# Patient Record
Sex: Male | Born: 1940
Health system: Southern US, Community
[De-identification: ages and names within clinical notes are randomized; demographics above are authoritative.]

## PROBLEM LIST (undated history)

## (undated) DIAGNOSIS — I499 Cardiac arrhythmia, unspecified: Secondary | ICD-10-CM

## (undated) DIAGNOSIS — N390 Urinary tract infection, site not specified: Secondary | ICD-10-CM

## (undated) DIAGNOSIS — T8859XA Other complications of anesthesia, initial encounter: Secondary | ICD-10-CM

## (undated) DIAGNOSIS — H9190 Unspecified hearing loss, unspecified ear: Secondary | ICD-10-CM

## (undated) DIAGNOSIS — I480 Paroxysmal atrial fibrillation: Secondary | ICD-10-CM

## (undated) DIAGNOSIS — T4145XA Adverse effect of unspecified anesthetic, initial encounter: Secondary | ICD-10-CM

## (undated) DIAGNOSIS — R350 Frequency of micturition: Secondary | ICD-10-CM

## (undated) DIAGNOSIS — M1990 Unspecified osteoarthritis, unspecified site: Secondary | ICD-10-CM

## (undated) DIAGNOSIS — Z8719 Personal history of other diseases of the digestive system: Secondary | ICD-10-CM

## (undated) DIAGNOSIS — I350 Nonrheumatic aortic (valve) stenosis: Secondary | ICD-10-CM

## (undated) DIAGNOSIS — I2510 Atherosclerotic heart disease of native coronary artery without angina pectoris: Secondary | ICD-10-CM

## (undated) DIAGNOSIS — C801 Malignant (primary) neoplasm, unspecified: Secondary | ICD-10-CM

## (undated) HISTORY — PX: MULTIPLE TOOTH EXTRACTIONS: SHX2053

## (undated) HISTORY — DX: Atherosclerotic heart disease of native coronary artery without angina pectoris: I25.10

## (undated) HISTORY — PX: ANKLE SURGERY: SHX546

## (undated) HISTORY — PX: ELBOW SURGERY: SHX618

## (undated) HISTORY — PX: KNEE SURGERY: SHX244

## (undated) HISTORY — PX: JOINT REPLACEMENT: SHX530

## (undated) HISTORY — DX: Nonrheumatic aortic (valve) stenosis: I35.0

## (undated) HISTORY — DX: Cardiac arrhythmia, unspecified: I49.9

## (undated) HISTORY — PX: EYE SURGERY: SHX253

## (undated) HISTORY — DX: Paroxysmal atrial fibrillation: I48.0

## (undated) HISTORY — PX: TOTAL KNEE ARTHROPLASTY: SHX125

## (undated) HISTORY — DX: Unspecified osteoarthritis, unspecified site: M19.90

## (undated) HISTORY — PX: HERNIA REPAIR: SHX51

## (undated) HISTORY — PX: COLONOSCOPY W/ BIOPSIES AND POLYPECTOMY: SHX1376

## (undated) SURGERY — Surgical Case
Anesthesia: *Unknown

---

## 2001-04-07 ENCOUNTER — Ambulatory Visit (HOSPITAL_COMMUNITY): Admission: RE | Admit: 2001-04-07 | Discharge: 2001-04-07 | Payer: Self-pay | Admitting: *Deleted

## 2001-04-07 ENCOUNTER — Encounter (INDEPENDENT_AMBULATORY_CARE_PROVIDER_SITE_OTHER): Payer: Self-pay | Admitting: *Deleted

## 2001-04-21 ENCOUNTER — Ambulatory Visit (HOSPITAL_COMMUNITY): Admission: RE | Admit: 2001-04-21 | Discharge: 2001-04-21 | Payer: Self-pay | Admitting: *Deleted

## 2001-04-21 ENCOUNTER — Encounter (INDEPENDENT_AMBULATORY_CARE_PROVIDER_SITE_OTHER): Payer: Self-pay | Admitting: Specialist

## 2002-06-08 ENCOUNTER — Encounter (INDEPENDENT_AMBULATORY_CARE_PROVIDER_SITE_OTHER): Payer: Self-pay | Admitting: *Deleted

## 2002-06-08 ENCOUNTER — Ambulatory Visit (HOSPITAL_COMMUNITY): Admission: RE | Admit: 2002-06-08 | Discharge: 2002-06-08 | Payer: Self-pay | Admitting: *Deleted

## 2003-04-17 ENCOUNTER — Ambulatory Visit (HOSPITAL_BASED_OUTPATIENT_CLINIC_OR_DEPARTMENT_OTHER): Admission: RE | Admit: 2003-04-17 | Discharge: 2003-04-17 | Payer: Self-pay | Admitting: Orthopedic Surgery

## 2004-07-03 ENCOUNTER — Encounter (INDEPENDENT_AMBULATORY_CARE_PROVIDER_SITE_OTHER): Payer: Self-pay | Admitting: Specialist

## 2004-07-03 ENCOUNTER — Ambulatory Visit (HOSPITAL_COMMUNITY): Admission: RE | Admit: 2004-07-03 | Discharge: 2004-07-03 | Payer: Self-pay | Admitting: *Deleted

## 2004-10-02 ENCOUNTER — Ambulatory Visit (HOSPITAL_BASED_OUTPATIENT_CLINIC_OR_DEPARTMENT_OTHER): Admission: RE | Admit: 2004-10-02 | Discharge: 2004-10-02 | Payer: Self-pay | Admitting: Orthopedic Surgery

## 2004-10-02 ENCOUNTER — Ambulatory Visit (HOSPITAL_COMMUNITY): Admission: RE | Admit: 2004-10-02 | Discharge: 2004-10-02 | Payer: Self-pay | Admitting: Orthopedic Surgery

## 2005-08-19 ENCOUNTER — Ambulatory Visit (HOSPITAL_COMMUNITY): Admission: RE | Admit: 2005-08-19 | Discharge: 2005-08-19 | Payer: Self-pay | Admitting: Ophthalmology

## 2010-04-08 ENCOUNTER — Inpatient Hospital Stay (HOSPITAL_COMMUNITY): Admission: RE | Admit: 2010-04-08 | Discharge: 2010-04-10 | Payer: Self-pay | Admitting: Orthopedic Surgery

## 2010-04-17 ENCOUNTER — Ambulatory Visit: Payer: Self-pay | Admitting: Internal Medicine

## 2010-04-17 ENCOUNTER — Inpatient Hospital Stay (HOSPITAL_COMMUNITY): Admission: EM | Admit: 2010-04-17 | Discharge: 2010-04-22 | Payer: Self-pay | Admitting: Orthopedic Surgery

## 2010-04-19 ENCOUNTER — Encounter: Payer: Self-pay | Admitting: Physical Medicine & Rehabilitation

## 2010-04-22 ENCOUNTER — Ambulatory Visit: Payer: Self-pay | Admitting: Infectious Diseases

## 2010-04-26 ENCOUNTER — Ambulatory Visit (HOSPITAL_COMMUNITY): Admission: RE | Admit: 2010-04-26 | Discharge: 2010-04-26 | Payer: Self-pay | Admitting: Orthopedic Surgery

## 2010-06-14 ENCOUNTER — Encounter
Admission: RE | Admit: 2010-06-14 | Discharge: 2010-06-14 | Payer: Self-pay | Source: Home / Self Care | Attending: Orthopedic Surgery | Admitting: Orthopedic Surgery

## 2010-10-31 LAB — CBC
HCT: 23.1 % — ABNORMAL LOW (ref 39.0–52.0)
HCT: 23.5 % — ABNORMAL LOW (ref 39.0–52.0)
HCT: 23.8 % — ABNORMAL LOW (ref 39.0–52.0)
HCT: 24.3 % — ABNORMAL LOW (ref 39.0–52.0)
Hemoglobin: 7.6 g/dL — ABNORMAL LOW (ref 13.0–17.0)
Hemoglobin: 7.7 g/dL — ABNORMAL LOW (ref 13.0–17.0)
Hemoglobin: 7.9 g/dL — ABNORMAL LOW (ref 13.0–17.0)
Hemoglobin: 8.1 g/dL — ABNORMAL LOW (ref 13.0–17.0)
MCH: 27.9 pg (ref 26.0–34.0)
MCH: 28.7 pg (ref 26.0–34.0)
MCH: 28.7 pg (ref 26.0–34.0)
MCH: 28.8 pg (ref 26.0–34.0)
MCHC: 32.3 g/dL (ref 30.0–36.0)
MCHC: 33.2 g/dL (ref 30.0–36.0)
MCHC: 33.3 g/dL (ref 30.0–36.0)
MCHC: 33.3 g/dL (ref 30.0–36.0)
MCV: 86.2 fL (ref 78.0–100.0)
MCV: 86.4 fL (ref 78.0–100.0)
MCV: 86.5 fL (ref 78.0–100.0)
MCV: 86.5 fL (ref 78.0–100.0)
Platelets: 430 10*3/uL — ABNORMAL HIGH (ref 150–400)
Platelets: 466 10*3/uL — ABNORMAL HIGH (ref 150–400)
Platelets: 483 10*3/uL — ABNORMAL HIGH (ref 150–400)
Platelets: 501 10*3/uL — ABNORMAL HIGH (ref 150–400)
RBC: 2.67 MIL/uL — ABNORMAL LOW (ref 4.22–5.81)
RBC: 2.72 MIL/uL — ABNORMAL LOW (ref 4.22–5.81)
RBC: 2.75 MIL/uL — ABNORMAL LOW (ref 4.22–5.81)
RBC: 2.82 MIL/uL — ABNORMAL LOW (ref 4.22–5.81)
RDW: 12.9 % (ref 11.5–15.5)
RDW: 13 % (ref 11.5–15.5)
RDW: 13.1 % (ref 11.5–15.5)
RDW: 13.3 % (ref 11.5–15.5)
WBC: 6.4 10*3/uL (ref 4.0–10.5)
WBC: 7.1 10*3/uL (ref 4.0–10.5)
WBC: 7.5 10*3/uL (ref 4.0–10.5)
WBC: 7.9 10*3/uL (ref 4.0–10.5)

## 2010-10-31 LAB — BASIC METABOLIC PANEL
BUN: 13 mg/dL (ref 6–23)
BUN: 14 mg/dL (ref 6–23)
BUN: 9 mg/dL (ref 6–23)
BUN: 9 mg/dL (ref 6–23)
CO2: 25 mEq/L (ref 19–32)
CO2: 26 mEq/L (ref 19–32)
CO2: 27 mEq/L (ref 19–32)
CO2: 28 mEq/L (ref 19–32)
Calcium: 8.2 mg/dL — ABNORMAL LOW (ref 8.4–10.5)
Calcium: 8.2 mg/dL — ABNORMAL LOW (ref 8.4–10.5)
Calcium: 8.3 mg/dL — ABNORMAL LOW (ref 8.4–10.5)
Calcium: 8.6 mg/dL (ref 8.4–10.5)
Chloride: 102 mEq/L (ref 96–112)
Chloride: 102 mEq/L (ref 96–112)
Chloride: 103 mEq/L (ref 96–112)
Chloride: 104 mEq/L (ref 96–112)
Creatinine, Ser: 0.72 mg/dL (ref 0.4–1.5)
Creatinine, Ser: 0.75 mg/dL (ref 0.4–1.5)
Creatinine, Ser: 0.81 mg/dL (ref 0.4–1.5)
Creatinine, Ser: 0.82 mg/dL (ref 0.4–1.5)
GFR calc Af Amer: >60 mL/min (ref >60)
GFR calc Af Amer: >60 mL/min (ref >60)
GFR calc Af Amer: >60 mL/min (ref >60)
GFR calc Af Amer: >60 mL/min (ref >60)
GFR calc non Af Amer: >60 mL/min (ref >60)
GFR calc non Af Amer: >60 mL/min (ref >60)
GFR calc non Af Amer: >60 mL/min (ref >60)
GFR calc non Af Amer: >60 mL/min (ref >60)
Glucose, Bld: 109 mg/dL — ABNORMAL HIGH (ref 70–99)
Glucose, Bld: 113 mg/dL — ABNORMAL HIGH (ref 70–99)
Glucose, Bld: 116 mg/dL — ABNORMAL HIGH (ref 70–99)
Glucose, Bld: 136 mg/dL — ABNORMAL HIGH (ref 70–99)
Potassium: 3.6 mEq/L (ref 3.5–5.1)
Potassium: 3.8 mEq/L (ref 3.5–5.1)
Potassium: 4.2 mEq/L (ref 3.5–5.1)
Potassium: 5.1 mEq/L (ref 3.5–5.1)
Sodium: 134 mEq/L — ABNORMAL LOW (ref 135–145)
Sodium: 134 mEq/L — ABNORMAL LOW (ref 135–145)
Sodium: 135 mEq/L (ref 135–145)
Sodium: 137 mEq/L (ref 135–145)

## 2010-10-31 LAB — GRAM STAIN

## 2010-10-31 LAB — VANCOMYCIN, TROUGH: Vancomycin Tr: 6.7 ug/mL — ABNORMAL LOW (ref 10.0–20.0)

## 2010-10-31 LAB — AFB CULTURE WITH SMEAR (NOT AT ARMC): Acid Fast Smear: NONE SEEN

## 2010-10-31 LAB — FUNGUS CULTURE W SMEAR: Fungal Smear: NONE SEEN

## 2010-10-31 LAB — CROSSMATCH
ABO/RH(D): O POS
Antibody Screen: NEGATIVE

## 2010-10-31 LAB — COMPREHENSIVE METABOLIC PANEL
ALT: 74 U/L — ABNORMAL HIGH (ref 0–53)
AST: 89 U/L — ABNORMAL HIGH (ref 0–37)
Albumin: 2.3 g/dL — ABNORMAL LOW (ref 3.5–5.2)
Alkaline Phosphatase: 108 U/L (ref 39–117)
BUN: 10 mg/dL (ref 6–23)
CO2: 27 mEq/L (ref 19–32)
Calcium: 8.1 mg/dL — ABNORMAL LOW (ref 8.4–10.5)
Chloride: 101 mEq/L (ref 96–112)
Creatinine, Ser: 0.78 mg/dL (ref 0.4–1.5)
GFR calc Af Amer: >60 mL/min (ref >60)
GFR calc non Af Amer: >60 mL/min (ref >60)
Glucose, Bld: 106 mg/dL — ABNORMAL HIGH (ref 70–99)
Potassium: 3.8 mEq/L (ref 3.5–5.1)
Sodium: 134 mEq/L — ABNORMAL LOW (ref 135–145)
Total Bilirubin: 1.3 mg/dL — ABNORMAL HIGH (ref 0.3–1.2)
Total Protein: 5.2 g/dL — ABNORMAL LOW (ref 6.0–8.3)

## 2010-10-31 LAB — BODY FLUID CULTURE: Culture: NO GROWTH

## 2010-10-31 LAB — MRSA PCR SCREENING: MRSA by PCR: NEGATIVE

## 2010-10-31 LAB — ANAEROBIC CULTURE

## 2010-10-31 LAB — GLUCOSE, CAPILLARY: Glucose-Capillary: 140 mg/dL — ABNORMAL HIGH (ref 70–99)

## 2010-10-31 LAB — C-REACTIVE PROTEIN: CRP: 6.3 mg/dL — ABNORMAL HIGH (ref <0.6)

## 2010-10-31 LAB — SEDIMENTATION RATE: Sed Rate: 50 mm/hr — ABNORMAL HIGH (ref 0–16)

## 2010-11-01 LAB — BASIC METABOLIC PANEL
BUN: 12 mg/dL (ref 6–23)
BUN: 15 mg/dL (ref 6–23)
CO2: 27 mEq/L (ref 19–32)
CO2: 29 mEq/L (ref 19–32)
Calcium: 8.3 mg/dL — ABNORMAL LOW (ref 8.4–10.5)
Calcium: 8.5 mg/dL (ref 8.4–10.5)
Chloride: 102 mEq/L (ref 96–112)
Chloride: 104 mEq/L (ref 96–112)
Creatinine, Ser: 0.87 mg/dL (ref 0.4–1.5)
Creatinine, Ser: 0.89 mg/dL (ref 0.4–1.5)
GFR calc Af Amer: >60 mL/min (ref >60)
GFR calc Af Amer: >60 mL/min (ref >60)
GFR calc non Af Amer: >60 mL/min (ref >60)
GFR calc non Af Amer: >60 mL/min (ref >60)
Glucose, Bld: 121 mg/dL — ABNORMAL HIGH (ref 70–99)
Glucose, Bld: 125 mg/dL — ABNORMAL HIGH (ref 70–99)
Potassium: 4.3 mEq/L (ref 3.5–5.1)
Potassium: 4.9 mEq/L (ref 3.5–5.1)
Sodium: 135 mEq/L (ref 135–145)
Sodium: 135 mEq/L (ref 135–145)

## 2010-11-01 LAB — BODY FLUID CULTURE: Culture: NO GROWTH

## 2010-11-01 LAB — URINALYSIS, ROUTINE W REFLEX MICROSCOPIC
Bilirubin Urine: NEGATIVE
Glucose, UA: NEGATIVE mg/dL
Glucose, UA: NEGATIVE mg/dL
Hgb urine dipstick: NEGATIVE
Hgb urine dipstick: NEGATIVE
Ketones, ur: 40 mg/dL — AB
Ketones, ur: NEGATIVE mg/dL
Leukocytes, UA: NEGATIVE
Nitrite: NEGATIVE
Nitrite: NEGATIVE
Protein, ur: 30 mg/dL — AB
Protein, ur: NEGATIVE mg/dL
Specific Gravity, Urine: 1.016 (ref 1.005–1.030)
Specific Gravity, Urine: 1.028 (ref 1.005–1.030)
Urobilinogen, UA: 1 mg/dL (ref 0.0–1.0)
Urobilinogen, UA: 1 mg/dL (ref 0.0–1.0)
pH: 6 (ref 5.0–8.0)
pH: 6.5 (ref 5.0–8.0)

## 2010-11-01 LAB — DIFFERENTIAL
Basophils Absolute: 0 10*3/uL (ref 0.0–0.1)
Basophils Absolute: 0 10*3/uL (ref 0.0–0.1)
Basophils Relative: 0 % (ref 0–1)
Basophils Relative: 1 % (ref 0–1)
Eosinophils Absolute: 0.1 10*3/uL (ref 0.0–0.7)
Eosinophils Absolute: 0.2 10*3/uL (ref 0.0–0.7)
Eosinophils Relative: 1 % (ref 0–5)
Eosinophils Relative: 3 % (ref 0–5)
Lymphocytes Relative: 25 % (ref 12–46)
Lymphocytes Relative: 9 % — ABNORMAL LOW (ref 12–46)
Lymphs Abs: 0.9 10*3/uL (ref 0.7–4.0)
Lymphs Abs: 1.4 10*3/uL (ref 0.7–4.0)
Monocytes Absolute: 0.5 10*3/uL (ref 0.1–1.0)
Monocytes Absolute: 1.2 10*3/uL — ABNORMAL HIGH (ref 0.1–1.0)
Monocytes Relative: 12 % (ref 3–12)
Monocytes Relative: 9 % (ref 3–12)
Neutro Abs: 3.4 10*3/uL (ref 1.7–7.7)
Neutro Abs: 7.7 10*3/uL (ref 1.7–7.7)
Neutrophils Relative %: 62 % (ref 43–77)
Neutrophils Relative %: 78 % — ABNORMAL HIGH (ref 43–77)

## 2010-11-01 LAB — CBC
HCT: 28.5 % — ABNORMAL LOW (ref 39.0–52.0)
HCT: 32.2 % — ABNORMAL LOW (ref 39.0–52.0)
HCT: 35.9 % — ABNORMAL LOW (ref 39.0–52.0)
HCT: 44.1 % (ref 39.0–52.0)
Hemoglobin: 11.1 g/dL — ABNORMAL LOW (ref 13.0–17.0)
Hemoglobin: 12.3 g/dL — ABNORMAL LOW (ref 13.0–17.0)
Hemoglobin: 15.2 g/dL (ref 13.0–17.0)
Hemoglobin: 9.7 g/dL — ABNORMAL LOW (ref 13.0–17.0)
MCH: 29 pg (ref 26.0–34.0)
MCH: 29.1 pg (ref 26.0–34.0)
MCH: 29.2 pg (ref 26.0–34.0)
MCH: 29.5 pg (ref 26.0–34.0)
MCHC: 34 g/dL (ref 30.0–36.0)
MCHC: 34.3 g/dL (ref 30.0–36.0)
MCHC: 34.5 g/dL (ref 30.0–36.0)
MCHC: 34.5 g/dL (ref 30.0–36.0)
MCV: 84.7 fL (ref 78.0–100.0)
MCV: 84.7 fL (ref 78.0–100.0)
MCV: 85.5 fL (ref 78.0–100.0)
MCV: 85.6 fL (ref 78.0–100.0)
Platelets: 100 10*3/uL — ABNORMAL LOW (ref 150–400)
Platelets: 107 10*3/uL — ABNORMAL LOW (ref 150–400)
Platelets: 156 10*3/uL (ref 150–400)
Platelets: 392 10*3/uL (ref 150–400)
RBC: 3.33 MIL/uL — ABNORMAL LOW (ref 4.22–5.81)
RBC: 3.8 MIL/uL — ABNORMAL LOW (ref 4.22–5.81)
RBC: 4.24 MIL/uL (ref 4.22–5.81)
RBC: 5.16 MIL/uL (ref 4.22–5.81)
RDW: 12.5 % (ref 11.5–15.5)
RDW: 12.5 % (ref 11.5–15.5)
RDW: 12.6 % (ref 11.5–15.5)
RDW: 12.9 % (ref 11.5–15.5)
WBC: 5.5 10*3/uL (ref 4.0–10.5)
WBC: 7.5 10*3/uL (ref 4.0–10.5)
WBC: 8.4 10*3/uL (ref 4.0–10.5)
WBC: 9.9 10*3/uL (ref 4.0–10.5)

## 2010-11-01 LAB — COMPREHENSIVE METABOLIC PANEL
ALT: 22 U/L (ref 0–53)
ALT: 36 U/L (ref 0–53)
AST: 27 U/L (ref 0–37)
AST: 40 U/L — ABNORMAL HIGH (ref 0–37)
Albumin: 3 g/dL — ABNORMAL LOW (ref 3.5–5.2)
Albumin: 4.1 g/dL (ref 3.5–5.2)
Alkaline Phosphatase: 67 U/L (ref 39–117)
Alkaline Phosphatase: 98 U/L (ref 39–117)
BUN: 18 mg/dL (ref 6–23)
BUN: 21 mg/dL (ref 6–23)
CO2: 25 mEq/L (ref 19–32)
CO2: 28 mEq/L (ref 19–32)
Calcium: 8.7 mg/dL (ref 8.4–10.5)
Calcium: 9.7 mg/dL (ref 8.4–10.5)
Chloride: 108 mEq/L (ref 96–112)
Chloride: 99 mEq/L (ref 96–112)
Creatinine, Ser: 0.96 mg/dL (ref 0.4–1.5)
Creatinine, Ser: 1 mg/dL (ref 0.4–1.5)
GFR calc Af Amer: >60 mL/min (ref >60)
GFR calc Af Amer: >60 mL/min (ref >60)
GFR calc non Af Amer: >60 mL/min (ref >60)
GFR calc non Af Amer: >60 mL/min (ref >60)
Glucose, Bld: 130 mg/dL — ABNORMAL HIGH (ref 70–99)
Glucose, Bld: 99 mg/dL (ref 70–99)
Potassium: 4.1 mEq/L (ref 3.5–5.1)
Potassium: 5.2 mEq/L — ABNORMAL HIGH (ref 3.5–5.1)
Sodium: 132 mEq/L — ABNORMAL LOW (ref 135–145)
Sodium: 141 mEq/L (ref 135–145)
Total Bilirubin: 0.9 mg/dL (ref 0.3–1.2)
Total Bilirubin: 1.6 mg/dL — ABNORMAL HIGH (ref 0.3–1.2)
Total Protein: 6.2 g/dL (ref 6.0–8.3)
Total Protein: 6.5 g/dL (ref 6.0–8.3)

## 2010-11-01 LAB — SYNOVIAL CELL COUNT + DIFF, W/ CRYSTALS
Crystals, Fluid: NONE SEEN
Lymphocytes-Synovial Fld: 1 % (ref 0–20)
Neutrophil, Synovial: 99 % — ABNORMAL HIGH (ref 0–25)
Other Cells-SYN: ABNORMAL
WBC, Synovial: UNDETERMINED /mm3 (ref 0–200)

## 2010-11-01 LAB — PROTIME-INR
INR: 1.01 (ref 0.00–1.49)
INR: 1.18 (ref 0.00–1.49)
INR: 1.79 — ABNORMAL HIGH (ref 0.00–1.49)
INR: 1.89 — ABNORMAL HIGH (ref 0.00–1.49)
Prothrombin Time: 13.5 seconds (ref 11.6–15.2)
Prothrombin Time: 15.2 seconds (ref 11.6–15.2)
Prothrombin Time: 21 seconds — ABNORMAL HIGH (ref 11.6–15.2)
Prothrombin Time: 21.9 seconds — ABNORMAL HIGH (ref 11.6–15.2)

## 2010-11-01 LAB — CULTURE, BLOOD (ROUTINE X 2)
Culture: NO GROWTH
Culture: NO GROWTH

## 2010-11-01 LAB — URINE CULTURE
Colony Count: NO GROWTH
Colony Count: NO GROWTH
Culture  Setup Time: 201108162043
Culture  Setup Time: 201109010050
Culture: NO GROWTH
Culture: NO GROWTH

## 2010-11-01 LAB — TYPE AND SCREEN
ABO/RH(D): O POS
Antibody Screen: NEGATIVE

## 2010-11-01 LAB — URINE MICROSCOPIC-ADD ON

## 2010-11-01 LAB — PATHOLOGIST SMEAR REVIEW

## 2010-11-01 LAB — SURGICAL PCR SCREEN
MRSA, PCR: NEGATIVE
Staphylococcus aureus: NEGATIVE

## 2010-11-01 LAB — APTT
aPTT: 26 seconds (ref 24–37)
aPTT: 41 seconds — ABNORMAL HIGH (ref 24–37)

## 2010-11-01 LAB — ABO/RH: ABO/RH(D): O POS

## 2011-01-03 NOTE — Op Note (Signed)
NAME:  Gerald Spears, Gerald Spears             ACCOUNT NO.:  [id number]   MEDICAL RECORD NO.:  [id number]          PATIENT TYPE:  AMB   LOCATION:  SDS                          FACILITY:  [facility]   PHYSICIAN:  Robert L. Dione Booze, M.D.  DATE OF BIRTH:  29-Nov-1940   DATE OF PROCEDURE:  08/19/2005  DATE OF DISCHARGE:                                 OPERATIVE REPORT   INDICATIONS AND JUSTIFICATIONS:  [name] was seen recently on July 02, 2005 reporting that he has droopy eyelids and this blocks his peripheral  vision and causes fatigue.  He can feel the weight of the lids.  Visual  field testing does show a large reduction of about 20-25 degrees in each  field  when the skin of the lid was taped compared to when it is not taped.  Photographs were taken to document the condition and the margin reflex  distance of about 1 mm on the right and 10 mm on the left.  Examination does  show that there is significant amount of __________  of each upper eyelid  that would be expected to obstruct the __________  in the temporal visual  field.  The examination further shows that the best corrected vision is 20-  20 in each eye.  The pressure is 19 in the right eye and 18 in the left.  The pupils, motility, conjunctivae, cornea, and two-chamber lens, and  dilated fundus exam were negative.  The problem was discussed and Mr.  Dambach decided to go ahead and have upper eyelid blepharoplasties for  visual reasons.  Medically, he should be stable for this.   Justification for performing the procedure in the outpatient setting is  routine.  Justification __________  none.   PREOPERATIVE DIAGNOSIS:  Severe dermatochalasis with visual impairment.   POSTOPERATIVE DIAGNOSIS:  Severe dermatochalasis with visual impairment.   OPERATION/PROCEDURE:  Upper eyelid blepharoplasties.   SURGEON:  Robert L. Dione Booze, M.D.   ANESTHESIA:  1% Xylocaine with epinephrine.   OPERATION/PROCEDURE:  The patient arrived in the minor  surgery room and was  prepped and draped in the routine fashion.  One percent Xylocaine with  epinephrine was given in the skin of each upper eyelid and skin to be  excised was carefully demarcated and excised.  Bleeding was controlled with  pressure and cautery.  Underlying fatty tissue was also excised.  After the  appropriate amount of skin and fatty tissue had been removed, each wound was  closed with a running 6-0 nylon suture and cold compresses and were applied.  Polysporin ointment was also used.  The patient then left the minor surgery  room, having done nicely.   FOLLOW UP:  The patient is to keep his head elevated today and use cold  compresses.  Starting tomorrow he is to use warm compresses and is to have  sutures removed in six days.           ______________________________  Doris Cheadle Dione Booze, M.D.    RLG/MEDQ  D:  08/20/2005  T:  08/21/2005  Job:  [id number]

## 2011-01-03 NOTE — Op Note (Signed)
NAME:  Gerald Spears, Gerald Spears                     ACCOUNT NO.:  [id number]   MEDICAL RECORD NO.:  [id number]                   PATIENT TYPE:  AMB   LOCATION:  DSC                                  FACILITY:  [facility]   PHYSICIAN:  Robert A. Thurston Hole, M.D.              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   POSTOPERATIVE DIAGNOSIS:  Left knee medial meniscal tear with  chondromalacia.   PROCEDURE:  1. Left knee examination under anesthesia followed by arthroscopic partial     medial meniscectomy.  2. Left knee chondroplasty.   SURGEON:  Elana Alm. Thurston Hole, M.D.   ASSISTANT:  Julien Girt, P.A.   ANESTHESIA:  General.   OPERATIVE TIME:  Thirty minutes.   COMPLICATIONS:  None.   INDICATION FOR PROCEDURE:  [name] is a 70 year old gentleman who has  had significant left knee pain for the past four to six weeks, increasing in  nature, with signs and symptoms and MRI documenting a medial meniscal tear,  who has failed conservative care and is now to undergo arthroscopy.   DESCRIPTION:  [name] was brought to the operating room on April 17, 2003 and placed on the operative table in supine position.  After an  adequate level of general anesthesia was obtained, his left knee was  examined under anesthesia.  Range of motion was 0 to 120 degrees with 1 to  2+ crepitation and knee stable to ligamentous exam with normal patellar  tracking.  The knee was sterilely injected with 0.25% Marcaine with  epinephrine.  The left leg was then prepped using sterile Duraprep and  draped using sterile technique.  Originally, through an inferolateral  portal, the arthroscope with a pump attachment was placed and through an  inferomedial portal, an arthroscopic probe was placed.  On initial  inspection in the medial  compartment, he was found to have 50% grade 3  chondromalacia, which was thoroughly debrided.  He had tearing of the  posterior and medial horn of the medial meniscus of which 50% was resected  back to a stable rim.  The intercondylar notch inspected; anterior and  posterior cruciate ligaments were normal.  Lateral compartment inspected;  the articular cartilage was normal and lateral meniscus was normal.  Patellofemoral joint inspected; articular cartilage was normal.  The patella  tracked normally.  Moderate synovitis in the medial and lateral gutters was  debrided, otherwise, they were free of pathology.  After this was done, it  was felt that all pathology had been satisfactorily addressed.  The  instruments were removed.  Portals were closed with 3-0 nylon suture and  injected with 0.25% Marcaine with epinephrine and 4 mg of morphine.  A  sterile dressing was applied and the patient awakened and taken to the  recovery room in stable condition.    FOLLOWUP CARE:  [name] will be followed as an outpatient on Vicodin and  Naprosyn.  I will see him back in the office in a week for sutures out and  followup.                                               Robert A. Thurston Hole, M.D.    RAW/MEDQ  D:  04/17/2003  T:  04/17/2003  Job:  [id number]

## 2011-01-03 NOTE — Op Note (Signed)
NAME:  Gerald Spears, Gerald Spears NO.:  [id number]   MEDICAL RECORD NO.:  [id number]          PATIENT TYPE:  AMB   LOCATION:  DSC                          FACILITY:  [facility]   PHYSICIAN:  Cindee Salt, M.D.       DATE OF BIRTH:  08-28-1940   DATE OF PROCEDURE:  10/02/2004  DATE OF DISCHARGE:                                 OPERATIVE REPORT   PREOPERATIVE DIAGNOSIS:  Ulnar neuropathy, right elbow.   POSTOPERATIVE DIAGNOSIS:  Ulnar neuropathy, right elbow.   OPERATION:  Decompression, right ulnar nerve, right elbow.   SURGEON:  Cindee Salt, M.D.   ASSISTANTCarolyne Fiscal.   ANESTHESIA:  General.   HISTORY:  The patient is a 70 year old male with a history of ulnar  neuropathy of his right elbow, EMG nerve conductions positive.  This has not  responded to conservative treatment.   PROCEDURE:  The patient was brought to the operating room, where a general  anesthetic was carried out without difficulty.  He was prepped using  DuraPrep, supine position, right arm free.  The limb was exsanguinated with  an Esmarch bandage, tourniquet placed on the arm was inflated to 250 mmHg.  A curvilinear incision was made over the medial epicondyle, carried down  through subcutaneous tissue.  Bleeders were electrocauterized and dissection carried down to the medial  epicondyle, the posterior branch of the medial antebrachial cutaneous nerve  of the arm identified.  The Osborne's fascia was then removed.  A tight band  was present in the flexor carpi ulnaris.  This was released, a fasciotomy  performed of the flexor carpi ulnaris.  The nerve was then traced proximally  to the ligament of Struthers.  The entire area was decompressed.  The medial  intermuscular septum was not removed.  The arm was placed through full flexion.  No subluxation to the nerve was  present.  It was decided not to proceed with transposition but maintain  simple decompression.  The wound was copiously irrigated with saline.   The  subcutaneous tissue was then closed with interrupted 4-0 Vicryl sutures and  the skin with a subcuticular of 4-0 Monocryl suture.  Steri-Strips were  applied, a sterile compressive dressing and a long-arm splint applied.  The  patient tolerated procedure well was taken to the recovery room for  observation in satisfactory condition.  He is discharged home to return to  the [facility] in one week on Vicodin.      GK/MEDQ  D:  10/02/2004  T:  10/02/2004  Job:  [id number]

## 2011-02-03 ENCOUNTER — Inpatient Hospital Stay (HOSPITAL_COMMUNITY)
Admission: RE | Admit: 2011-02-03 | Discharge: 2011-02-09 | DRG: 464 | Disposition: A | Discharge status: Home Health | Payer: Medicare Other | Source: Ambulatory Visit | Attending: Orthopedic Surgery | Admitting: Orthopedic Surgery

## 2011-02-03 DIAGNOSIS — M009 Pyogenic arthritis, unspecified: Secondary | ICD-10-CM

## 2011-02-03 DIAGNOSIS — Z96659 Presence of unspecified artificial knee joint: Secondary | ICD-10-CM

## 2011-02-03 DIAGNOSIS — Y831 Surgical operation with implant of artificial internal device as the cause of abnormal reaction of the patient, or of later complication, without mention of misadventure at the time of the procedure: Secondary | ICD-10-CM | POA: Diagnosis present

## 2011-02-03 DIAGNOSIS — T8450XA Infection and inflammatory reaction due to unspecified internal joint prosthesis, initial encounter: Principal | ICD-10-CM | POA: Diagnosis present

## 2011-02-03 LAB — URINALYSIS, ROUTINE W REFLEX MICROSCOPIC
Bilirubin Urine: NEGATIVE
Glucose, UA: NEGATIVE mg/dL
Hgb urine dipstick: NEGATIVE
Ketones, ur: NEGATIVE mg/dL
Leukocytes, UA: NEGATIVE
Nitrite: NEGATIVE
Protein, ur: NEGATIVE mg/dL
Specific Gravity, Urine: 1.02 (ref 1.005–1.030)
Urobilinogen, UA: 0.2 mg/dL (ref 0.0–1.0)
pH: 5.5 (ref 5.0–8.0)

## 2011-02-03 LAB — DIFFERENTIAL
Basophils Absolute: 0 10*3/uL (ref 0.0–0.1)
Basophils Relative: 0 % (ref 0–1)
Eosinophils Absolute: 0.1 10*3/uL (ref 0.0–0.7)
Eosinophils Relative: 1 % (ref 0–5)
Lymphocytes Relative: 12 % (ref 12–46)
Lymphs Abs: 1.3 10*3/uL (ref 0.7–4.0)
Monocytes Absolute: 0.8 10*3/uL (ref 0.1–1.0)
Monocytes Relative: 7 % (ref 3–12)
Neutro Abs: 8.9 10*3/uL — ABNORMAL HIGH (ref 1.7–7.7)
Neutrophils Relative %: 81 % — ABNORMAL HIGH (ref 43–77)

## 2011-02-03 LAB — CBC
HCT: 37.7 % — ABNORMAL LOW (ref 39.0–52.0)
Hemoglobin: 12.7 g/dL — ABNORMAL LOW (ref 13.0–17.0)
MCH: 27.1 pg (ref 26.0–34.0)
MCHC: 33.7 g/dL (ref 30.0–36.0)
MCV: 80.6 fL (ref 78.0–100.0)
Platelets: 247 10*3/uL (ref 150–400)
RBC: 4.68 MIL/uL (ref 4.22–5.81)
RDW: 12.8 % (ref 11.5–15.5)
WBC: 11.1 10*3/uL — ABNORMAL HIGH (ref 4.0–10.5)

## 2011-02-03 LAB — APTT: aPTT: 33 seconds (ref 24–37)

## 2011-02-03 LAB — SURGICAL PCR SCREEN
MRSA, PCR: NEGATIVE
Staphylococcus aureus: NEGATIVE

## 2011-02-03 LAB — PROTIME-INR
INR: 1.08 (ref 0.00–1.49)
Prothrombin Time: 14.2 seconds (ref 11.6–15.2)

## 2011-02-03 LAB — TYPE AND SCREEN
ABO/RH(D): O POS
Antibody Screen: NEGATIVE

## 2011-02-03 LAB — BASIC METABOLIC PANEL
BUN: 19 mg/dL (ref 6–23)
CO2: 27 mEq/L (ref 19–32)
Calcium: 9.2 mg/dL (ref 8.4–10.5)
Chloride: 100 mEq/L (ref 96–112)
Creatinine, Ser: 0.78 mg/dL (ref 0.50–1.35)
GFR calc Af Amer: >60 mL/min (ref >60)
GFR calc non Af Amer: >60 mL/min (ref >60)
Glucose, Bld: 92 mg/dL (ref 70–99)
Potassium: 4.2 mEq/L (ref 3.5–5.1)
Sodium: 136 mEq/L (ref 135–145)

## 2011-02-04 LAB — BASIC METABOLIC PANEL
BUN: 16 mg/dL (ref 6–23)
CO2: 29 mEq/L (ref 19–32)
Calcium: 8.4 mg/dL (ref 8.4–10.5)
Chloride: 100 mEq/L (ref 96–112)
Creatinine, Ser: 0.73 mg/dL (ref 0.50–1.35)
GFR calc Af Amer: >60 mL/min (ref >60)
GFR calc non Af Amer: >60 mL/min (ref >60)
Glucose, Bld: 141 mg/dL — ABNORMAL HIGH (ref 70–99)
Potassium: 4.1 mEq/L (ref 3.5–5.1)
Sodium: 133 mEq/L — ABNORMAL LOW (ref 135–145)

## 2011-02-04 LAB — CBC
HCT: 32.4 % — ABNORMAL LOW (ref 39.0–52.0)
Hemoglobin: 10.8 g/dL — ABNORMAL LOW (ref 13.0–17.0)
MCH: 26.9 pg (ref 26.0–34.0)
MCHC: 33.3 g/dL (ref 30.0–36.0)
MCV: 80.6 fL (ref 78.0–100.0)
Platelets: 210 10*3/uL (ref 150–400)
RBC: 4.02 MIL/uL — ABNORMAL LOW (ref 4.22–5.81)
RDW: 12.8 % (ref 11.5–15.5)
WBC: 10 10*3/uL (ref 4.0–10.5)

## 2011-02-04 LAB — PROTIME-INR
INR: 1.15 (ref 0.00–1.49)
Prothrombin Time: 14.9 seconds (ref 11.6–15.2)

## 2011-02-04 LAB — C-REACTIVE PROTEIN: CRP: 6.4 mg/dL — ABNORMAL HIGH (ref <0.6)

## 2011-02-04 LAB — SEDIMENTATION RATE: Sed Rate: 35 mm/hr — ABNORMAL HIGH (ref 0–16)

## 2011-02-05 LAB — CBC
HCT: 31.1 % — ABNORMAL LOW (ref 39.0–52.0)
Hemoglobin: 10.3 g/dL — ABNORMAL LOW (ref 13.0–17.0)
MCH: 27 pg (ref 26.0–34.0)
MCHC: 33.1 g/dL (ref 30.0–36.0)
MCV: 81.6 fL (ref 78.0–100.0)
Platelets: 181 10*3/uL (ref 150–400)
RBC: 3.81 MIL/uL — ABNORMAL LOW (ref 4.22–5.81)
RDW: 13 % (ref 11.5–15.5)
WBC: 7.2 10*3/uL (ref 4.0–10.5)

## 2011-02-05 LAB — CK: Total CK: 62 U/L (ref 7–232)

## 2011-02-05 LAB — PROTIME-INR
INR: 1.4 (ref 0.00–1.49)
Prothrombin Time: 17.4 seconds — ABNORMAL HIGH (ref 11.6–15.2)

## 2011-02-05 NOTE — Op Note (Signed)
NAMEMarland Kitchen  Gerald Spears, Gerald Spears NO.:  [id number]  MEDICAL RECORD NO.:  [id number]  LOCATION:  5018                         FACILITY:  [facility]  PHYSICIAN:  Feliberto Gottron. Turner Daniels, M.D.   DATE OF BIRTH:  1941-03-11  DATE OF PROCEDURE:  02/03/2011 DATE OF DISCHARGE:                              OPERATIVE REPORT   PREOPERATIVE DIAGNOSIS:  Infected right total knee.  POSTOPERATIVE DIAGNOSIS:  Infected right total knee.  PROCEDURE:  Radical irrigation and debridement of right total knee, removal of implants, placement of antibiotic-impregnated spacer.  SURGEON:  Feliberto Gottron. Turner Daniels, MD  FIRST ASSISTANT:  Sandria Bales, MD  SECOND ASSISTANT:  Shirl Harris, PA-C.  ANESTHESIA:  General endotracheal.  ESTIMATED BLOOD LOSS:  Minimal.  FLUID REPLACEMENT:  1500 mL of crystalloid.  DRAINS PLACED:  Two medium Hemovac and Foley catheter.  URINE OUTPUT:  300 mL.  INDICATIONS FOR PROCEDURE:  A 69 year old man who had a right total knee placed back in August 2012, had an early postoperative infection, underwent an early and urgent synovectomy and slopping out of polyethylene.  With IV antibiotics for about 4 weeks, cultures came back positive for methicillin-resistant staph epidermidis, but was on a reculture, and Infectious Diseases was absolutely sure this was the attending organisms.  In any event, he did well until last week when he had swelling and pain in his knee, saw Dr. Wyline Mood, had aspiration with 3 mL of blood, it came back with 300 decel in the organisms.  I saw him in consultation 2 days later at which time he had increased swelling in knee and aspiration yielded 30 mL of purulent material.  This material came back for almost pure PMNs, but unfortunately after 3 days did not grow any organisms.  Having said that, the fluid had the appearance of a gross pus.  He was placed on doxycycline last Thursday when the aspiration occurred over the weekend.  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  Risks and benefits of surgery have been discussed.  DESCRIPTION OF PROCEDURE:  The patient identified by armband, and taken to operating room 5 with appropriate anesthetic monitors attached and general endotracheal anesthesia was induced.  Tourniquet was applied high to the right thigh, and the right lower extremity was prepped and draped in usual sterile fashion first placing a foot rest and a lateral post on the table.  During the prep and drape, the knee was kept elevated at 45 degrees angle as the leg was.  The drapes were actually placed with the leg elevated.  After the leg was prepped and draped and a time-out procedure performed, the tourniquet was inflated to 350 mmHg. We did not use an Esmarch.  Utilizing the old total knee incision, which was about 18 cm in length, we cut through the skin and subcutaneous tissue down to the interval over the quadriceps tendon and patella and near the tibial tubercle.  We then elevated and reflected a medial flap right off of the fascia over the quadriceps tendon, patella, and proximal tibia.  We then made a parapatellar arthrotomy and encountered more purulent material.  We then performed a fairly radical synovectomy removing large amounts of inflamed synovium, and after that elevated the superficial medial collateral ligament, and subluxed the patella laterally allowing [place] to hyperflex the knee and expose the implants. There was obvious purulent material beneath the edges of the femoral and tibial components.  With the patella subluxed, we were able to use an osteotome and a removed the tibial bearing by cutting through the stem and then set about removing the femur using thin osteotomes around the edges until we  were able to go underneath the anterior flange, the posterior flange, and the distal flange, and chamfer cut and then used femoral extractor and take the femur off.  The bone was actually in pretty good shape.  Bits of cement were interdigitated into the bone were also removed at this time.  It was noted there was infectious membrane underneath the femoral implant, and this was removed as well. We then examined the tibial implant with a knee hyperflexed and using a 1-inch wide osteotome, packs removed relatively easily because of infectious membrane between the cement of the tibia and the bone also. In a similar fashion, the patella was removed.  We then spent the next 40-45 minutes removing cement.  It was interdigitated into the bone as well as the column of cement that was below the tip of the tibial implant.  The wound was then thoroughly pulse lavaged x2 completing the irrigation and debridement.  Satisfied with this, we then mixed a quadruple batch of the DePuy HV cement with 3 g tobramycin and 2.4 g of vancomycin and after 5 minutes when it was in a rather thick doughy state, matted into a spacer.  It was placed between the femur and tibia with the knee bent about 5 degrees and mild traction placed on the limb. We also fashioned a patellar implant for the back side of the patella. As the cement hardened, we used pulse lavage to make sure the tissues remained full and medium Hemovacs were placed on the wound.  After this had been completed, we closed over the drains using a running #1 Vicryl suture and the arthrotomy, 2-0 Vicryl in the subcutaneous tissue and skin staples.  We then applied a dressing of Xeroform, 4 x 4 dressing, sponges, Webril, Ace wrap, and knee immobilizer and at this point, the patient was then awakened, extubated, and taken to the recovery room after we had let the tourniquet down.     Feliberto Gottron. Turner Daniels, M.D.     Ovid Curd  D:  02/03/2011  T:  02/04/2011   Job:  [id number]  Electronically Signed by Gean Birchwood M.D. on 02/05/2011 06:01:35 PM

## 2011-02-06 LAB — PROTIME-INR
INR: 1.41 (ref 0.00–1.49)
Prothrombin Time: 17.5 seconds — ABNORMAL HIGH (ref 11.6–15.2)

## 2011-02-06 LAB — WOUND CULTURE
Culture: NO GROWTH
Gram Stain: NONE SEEN

## 2011-02-06 LAB — CBC
HCT: 28.1 % — ABNORMAL LOW (ref 39.0–52.0)
Hemoglobin: 9.4 g/dL — ABNORMAL LOW (ref 13.0–17.0)
MCH: 27 pg (ref 26.0–34.0)
MCHC: 33.5 g/dL (ref 30.0–36.0)
MCV: 80.7 fL (ref 78.0–100.0)
Platelets: 192 10*3/uL (ref 150–400)
RBC: 3.48 MIL/uL — ABNORMAL LOW (ref 4.22–5.81)
RDW: 13.2 % (ref 11.5–15.5)
WBC: 5.4 10*3/uL (ref 4.0–10.5)

## 2011-02-07 LAB — TISSUE CULTURE: Culture: NO GROWTH

## 2011-02-07 LAB — PROTIME-INR
INR: 1.51 — ABNORMAL HIGH (ref 0.00–1.49)
Prothrombin Time: 18.5 seconds — ABNORMAL HIGH (ref 11.6–15.2)

## 2011-02-08 LAB — ANAEROBIC CULTURE: Gram Stain: NONE SEEN

## 2011-02-08 LAB — PROTIME-INR
INR: 1.73 — ABNORMAL HIGH (ref 0.00–1.49)
Prothrombin Time: 20.6 seconds — ABNORMAL HIGH (ref 11.6–15.2)

## 2011-02-09 LAB — PROTIME-INR
INR: 1.54 — ABNORMAL HIGH (ref 0.00–1.49)
Prothrombin Time: 18.8 seconds — ABNORMAL HIGH (ref 11.6–15.2)

## 2011-02-10 ENCOUNTER — Encounter (HOSPITAL_COMMUNITY): Payer: Medicare Other | Attending: Infectious Diseases

## 2011-02-10 DIAGNOSIS — M009 Pyogenic arthritis, unspecified: Secondary | ICD-10-CM | POA: Insufficient documentation

## 2011-02-10 DIAGNOSIS — Z452 Encounter for adjustment and management of vascular access device: Secondary | ICD-10-CM | POA: Insufficient documentation

## 2011-02-11 ENCOUNTER — Encounter (HOSPITAL_COMMUNITY): Payer: Medicare Other

## 2011-02-11 ENCOUNTER — Telehealth: Payer: Self-pay | Admitting: *Deleted

## 2011-02-11 NOTE — Telephone Encounter (Signed)
Patient's  wife called requesting either an order for Home [facility]) to administer IV antibiotics on the weekend.  Patient is going to short stay during the week, due to co pays.  He does not want to have to go through the ED on the weekend.    Patient's wife Burna Mortimer is also requesting that Dr. Ninetta Lights call her at [phone] Wendall Mola CMA

## 2011-02-12 ENCOUNTER — Telehealth: Payer: Self-pay | Admitting: *Deleted

## 2011-02-12 ENCOUNTER — Other Ambulatory Visit: Payer: Self-pay | Admitting: Infectious Diseases

## 2011-02-12 ENCOUNTER — Encounter (HOSPITAL_COMMUNITY): Payer: Medicare Other

## 2011-02-12 LAB — CK TOTAL AND CKMB (NOT AT ARMC)
CK, MB: 2.2 ng/mL (ref 0.3–4.0)
Relative Index: 1.7 (ref 0.0–2.5)
Total CK: 128 U/L (ref 7–232)

## 2011-02-12 NOTE — Telephone Encounter (Signed)
Orders faxed to [facility] for Cubicin IV antibiotics x 35 days.  Spoke to [employer]  Orders also faxed to [facility]. Wendall Mola CMA

## 2011-02-13 ENCOUNTER — Encounter (HOSPITAL_COMMUNITY): Payer: Medicare Other

## 2011-02-13 NOTE — Telephone Encounter (Signed)
Orders were sent to [facility], patient decided to have IV antibiotics at home every day instead of going to Short Stay. Wendall Mola CMA

## 2011-02-14 ENCOUNTER — Encounter (HOSPITAL_COMMUNITY): Payer: BLUE CROSS/BLUE SHIELD

## 2011-02-17 ENCOUNTER — Encounter (HOSPITAL_COMMUNITY): Payer: BLUE CROSS/BLUE SHIELD

## 2011-02-18 ENCOUNTER — Encounter (HOSPITAL_COMMUNITY): Payer: BLUE CROSS/BLUE SHIELD

## 2011-02-20 ENCOUNTER — Encounter (HOSPITAL_COMMUNITY): Payer: BLUE CROSS/BLUE SHIELD

## 2011-02-21 ENCOUNTER — Encounter (HOSPITAL_COMMUNITY): Payer: BLUE CROSS/BLUE SHIELD

## 2011-02-24 ENCOUNTER — Encounter (HOSPITAL_COMMUNITY): Payer: BLUE CROSS/BLUE SHIELD

## 2011-02-25 ENCOUNTER — Encounter (HOSPITAL_COMMUNITY): Payer: BLUE CROSS/BLUE SHIELD

## 2011-02-26 ENCOUNTER — Encounter (HOSPITAL_COMMUNITY): Payer: BLUE CROSS/BLUE SHIELD

## 2011-02-27 ENCOUNTER — Encounter (HOSPITAL_COMMUNITY): Payer: BLUE CROSS/BLUE SHIELD

## 2011-02-28 ENCOUNTER — Encounter (HOSPITAL_COMMUNITY): Payer: BLUE CROSS/BLUE SHIELD

## 2011-03-01 NOTE — Discharge Summary (Signed)
NAMEMarland Kitchen  ARJAN, STROHM NO.:  [id number]  MEDICAL RECORD NO.:  [id number]  LOCATION:  5018                         FACILITY:  [facility]  PHYSICIAN:  Feliberto Gottron. Turner Daniels, M.D.   DATE OF BIRTH:  09-25-1940  DATE OF ADMISSION:  02/03/2011 DATE OF DISCHARGE:  02/09/2011                              DISCHARGE SUMMARY   CHIEF COMPLAINT:  Painful right total knee.  HISTORY OF PRESENT ILLNESS:  This is a 70 year old gentleman with persistent pain and swelling in his right total knee.  His primary knee surgery was done 1 year ago by Dr. Wyline Mood.  He has already undergone incision and drainage with poly exchange for methicillin-resistant staph epidermis.  He has recently developed recurrent swelling and was sent to Dr. Turner Daniels for second opinion.  Dr. Turner Daniels aspirated Mr. Betancur in the clinic and got purulent material which was sent off for Gram stain and culture.  Mr. Testa was then scheduled for revision of his right total knee.  PAST MEDICAL HISTORY:  Unremarkable.  PAST SURGICAL HISTORY:  Significant for right total knee and right knee poly exchange.  ALLERGIES:  He has no known drug allergies.  SOCIAL HISTORY:  He denies use of alcohol or tobacco.  FAMILY HISTORY:  Positive for heart disease.  OBJECTIVE:  Gross examination of the right knee demonstrates a 1+ effusion.  Range of motion is 5-125 degrees.  He has a well-healed surgical incision.  X-rays demonstrate well-placed, well-fixed right total knee components.  PREOP LABS:  White blood cells 11.1, red blood cells 4.68, hemoglobin 12.7, hematocrit 37.7, platelets 247,000.  PT 14.2, INR 1.08, PTT 33. Sodium 136, potassium 4.2, chloride 100, glucose 92, BUN 19, creatinine 0.78.  Urinalysis was within normal limits.  HOSPITAL COURSE:  Mr. Saccente was admitted to [facility] on February 03, 2011 when he underwent revision of his right total knee.  The procedure was performed by Dr. Gean Birchwood and the patient tolerated  it well.  All of his knee components were removed and replaced with a cement spacer. Two large Hemovac drains were placed into the knee.  Perioperative Foley catheter was placed.  He was transferred to the floor on Lovenox and Coumadin for DVT prophylaxis.  Infectious Disease was consulted for managing his antibiotics.  On the first postoperative day, he was awake and alert and tolerating p.o. intake well.  His drain output was 200 mL. Hemoglobin was 10.8.  On the second postoperative day, he was reporting good pain control.  Hemoglobin was 10.3.  Drain output was 75 mL.  He was evaluated by Physical Therapy.  He is partial weightbearing.  On the third postoperative day, his hemoglobin was 9.4.  His drain output had increased to 200 mL after he was particularly active with physical therapy, so the drain remained in another day.  On the 4th postoperative day, he was ambulating well with therapy.  His cultures were positive for coag-negative staph epidermis.  Knee drain output was 150 mL, so the drain was left in for an additional day.  On the 5th postoperative day, he was doing well.  He had decreased output in his drain, so it was pulled and he was kept  for evaluation to determine whether or not his effusion reaccumulated.  On postoperative day 6, he had a very mild effusion.  So he was discharged home on IV antibiotics per Infectious Disease.  DISPOSITION:  The patient was discharged home on February 09, 2011.  He was partial weightbearing.  Home health care would manage his Coumadin and IV antibiotics.  He would return to the clinic in a week for followup.  FINAL DIAGNOSIS:  Right total knee infection.     Shirl Harris, PA   ______________________________ Feliberto Gottron. Turner Daniels, M.D.    JW/MEDQ  D:  02/24/2011  T:  02/24/2011  Job:  [id number]  Electronically Signed by Shirl Harris PA on 02/26/2011 08:23:06 AM Electronically Signed by Gean Birchwood M.D. on 03/01/2011 11:53:30 PM

## 2011-03-11 ENCOUNTER — Telehealth: Payer: Self-pay | Admitting: *Deleted

## 2011-03-11 NOTE — Telephone Encounter (Signed)
Left message for Gerald Spears at the hospital to call me about making him an appt asap

## 2011-03-11 NOTE — Telephone Encounter (Signed)
I spoke with Gerald Spears at [name]. She will get him in to see md tomorrow & will call pt to let him know . I called the wife & she has been called abou the appt

## 2011-03-11 NOTE — Telephone Encounter (Signed)
Wife wants an appt. States she has tried twice & her spouse is not a pt here. Said dr told her she could have an appt. She is concerned about the PICC. It has a small red bump at site. The RN who comes to the home agreed it should be looked at. She wants it seen this week. He is almost done with the IV antibiotic & does not want to go to ED or other place where he would be exposed to more germs.  To Tomasita Morrow RN for ok to make an appt

## 2011-03-11 NOTE — Telephone Encounter (Signed)
Scott, rn with [facility] called [phone]) to report that the PICC line has a bloody purulent drainage & a 2cm boil that is not draining. I told him pt has an appt tomorrow at 10:30am

## 2011-03-12 ENCOUNTER — Encounter: Payer: Self-pay | Admitting: Infectious Diseases

## 2011-03-12 ENCOUNTER — Ambulatory Visit (INDEPENDENT_AMBULATORY_CARE_PROVIDER_SITE_OTHER): Payer: Medicare Other | Admitting: Infectious Diseases

## 2011-03-12 VITALS — BP 99/64 | HR 96 | Temp 97.4°F | Ht 71.0 in | Wt 180.0 lb

## 2011-03-12 DIAGNOSIS — M869 Osteomyelitis, unspecified: Secondary | ICD-10-CM

## 2011-03-12 MED ORDER — DOXYCYCLINE HYCLATE 100 MG PO TABS: 100.0000 mg | ORAL_TABLET | Freq: Two times a day (BID) | ORAL | Status: AC

## 2011-03-12 NOTE — Assessment & Plan Note (Signed)
He appears to be doing well. Will have his PIC pulled on Friday. Will transition him to po doxy at that point. Will plan for him to stay on doxy for 1 year. He and his wife have questions about when he may have re-implantation. I suggested that this will be up to Dr Turner Daniels, certainly would hope that warmth in his R knee decreases somewhat prior to a repeat procedure. He will rtc in 3 months.

## 2011-03-12 NOTE — Progress Notes (Signed)
  Subjective:    Patient ID: Gerald Spears, male    DOB: 17-May-1941, 70 y.o.   MRN: [id number]  HPI 70 year old gentleman with persistent pain and swelling in his right total knee replacement. His primary knee  surgery was done 1 year ago by Dr. Wyline Mood. He has already undergone  incision and drainage with poly exchange for methicillin-resistant staph  epidermis on 04-19-10. He developed recurrent swelling and was sent to  Dr. Turner Daniels for second opinion. Dr. Turner Daniels aspirated Mr. Dohrmann in the  clinic and got purulent material. He was admitted to [facility] 02-03-23 and underwent radical irrigation and debridement of right total knee,   removal of implants, placement of antibiotic-impregnated spacer. His Cx again grew MRSE. 02-04-11 ESR 35, CRP 6.4. He was d/c home with cubicin.   Is having difficult with PIC line- has "red place". ESR 10 and CRP 0.8. CPK 101.    Feels like swelling is better, warmth decreased. Can bear some wt on his leg but hurts with too much.    Review of Systems  Constitutional: Negative for fever and chills.  Respiratory: Negative for shortness of breath.   Gastrointestinal: Negative for diarrhea and constipation.  Genitourinary: Negative for dysuria.       Objective:   Physical Exam  Constitutional: He appears well-developed and well-nourished.  Eyes: EOM are normal. Pupils are equal, round, and reactive to light.  Neck: Neck supple.  Cardiovascular: Normal rate, regular rhythm and normal heart sounds.   Pulmonary/Chest: Effort normal and breath sounds normal. No respiratory distress. He has no wheezes.  Abdominal: Soft. Bowel sounds are normal. He exhibits no distension. There is no tenderness.  Musculoskeletal:       RUE PIC line- has small papule at the insertion site. No d/c. No tenderness.  R Knee- no tenderness. Mod heat. No effusion. No erythema.           Assessment & Plan:

## 2011-03-13 ENCOUNTER — Telehealth: Payer: Self-pay | Admitting: *Deleted

## 2011-03-13 NOTE — Telephone Encounter (Signed)
[facility] called to confirm [facility] d/c today.  RN reviewed Dr. Moshe Cipro OV note from 03/12/11 and confirmed PIC D/C for today.  Jennet Maduro, RN

## 2011-03-18 ENCOUNTER — Encounter: Payer: Self-pay | Admitting: Infectious Diseases

## 2011-03-21 ENCOUNTER — Encounter: Payer: Self-pay | Admitting: Infectious Diseases

## 2011-05-19 ENCOUNTER — Encounter (HOSPITAL_COMMUNITY)
Admission: RE | Admit: 2011-05-19 | Discharge: 2011-05-19 | Disposition: A | Discharge status: Home / Self Care | Payer: Medicare Other | Source: Ambulatory Visit | Attending: Orthopedic Surgery | Admitting: Orthopedic Surgery

## 2011-05-19 LAB — TYPE AND SCREEN
ABO/RH(D): O POS
Antibody Screen: NEGATIVE

## 2011-05-19 LAB — BASIC METABOLIC PANEL
BUN: 21 mg/dL (ref 6–23)
CO2: 29 mEq/L (ref 19–32)
Calcium: 9.9 mg/dL (ref 8.4–10.5)
Chloride: 101 mEq/L (ref 96–112)
Creatinine, Ser: 0.87 mg/dL (ref 0.50–1.35)
GFR calc Af Amer: >90 mL/min (ref >90)
GFR calc non Af Amer: 85 mL/min — ABNORMAL LOW (ref >90)
Glucose, Bld: 96 mg/dL (ref 70–99)
Potassium: 4.8 mEq/L (ref 3.5–5.1)
Sodium: 137 mEq/L (ref 135–145)

## 2011-05-19 LAB — SURGICAL PCR SCREEN
MRSA, PCR: NEGATIVE
Staphylococcus aureus: NEGATIVE

## 2011-05-19 LAB — URINALYSIS, ROUTINE W REFLEX MICROSCOPIC
Bilirubin Urine: NEGATIVE
Glucose, UA: NEGATIVE mg/dL
Hgb urine dipstick: NEGATIVE
Ketones, ur: NEGATIVE mg/dL
Leukocytes, UA: NEGATIVE
Nitrite: NEGATIVE
Protein, ur: NEGATIVE mg/dL
Specific Gravity, Urine: 1.022 (ref 1.005–1.030)
Urobilinogen, UA: 0.2 mg/dL (ref 0.0–1.0)
pH: 5.5 (ref 5.0–8.0)

## 2011-05-19 LAB — CBC
HCT: 42.9 % (ref 39.0–52.0)
Hemoglobin: 14.7 g/dL (ref 13.0–17.0)
MCH: 27.4 pg (ref 26.0–34.0)
MCHC: 34.3 g/dL (ref 30.0–36.0)
MCV: 80 fL (ref 78.0–100.0)
Platelets: 201 10*3/uL (ref 150–400)
RBC: 5.36 MIL/uL (ref 4.22–5.81)
RDW: 14.1 % (ref 11.5–15.5)
WBC: 7.4 10*3/uL (ref 4.0–10.5)

## 2011-05-19 LAB — DIFFERENTIAL
Basophils Absolute: 0 10*3/uL (ref 0.0–0.1)
Basophils Relative: 0 % (ref 0–1)
Eosinophils Absolute: 0.2 10*3/uL (ref 0.0–0.7)
Eosinophils Relative: 3 % (ref 0–5)
Lymphocytes Relative: 21 % (ref 12–46)
Lymphs Abs: 1.5 10*3/uL (ref 0.7–4.0)
Monocytes Absolute: 0.7 10*3/uL (ref 0.1–1.0)
Monocytes Relative: 10 % (ref 3–12)
Neutro Abs: 4.8 10*3/uL (ref 1.7–7.7)
Neutrophils Relative %: 66 % (ref 43–77)

## 2011-05-19 LAB — PROTIME-INR
INR: 0.99 (ref 0.00–1.49)
Prothrombin Time: 13.3 seconds (ref 11.6–15.2)

## 2011-05-19 LAB — APTT: aPTT: 29 seconds (ref 24–37)

## 2011-05-23 ENCOUNTER — Inpatient Hospital Stay (HOSPITAL_COMMUNITY)
Admission: RE | Admit: 2011-05-23 | Discharge: 2011-05-25 | DRG: 468 | Disposition: A | Discharge status: Home Health | Payer: Medicare Other | Source: Ambulatory Visit | Attending: Orthopedic Surgery | Admitting: Orthopedic Surgery

## 2011-05-23 ENCOUNTER — Observation Stay (HOSPITAL_COMMUNITY): Payer: Medicare Other

## 2011-05-23 ENCOUNTER — Other Ambulatory Visit (HOSPITAL_COMMUNITY): Payer: Self-pay | Admitting: Orthopedic Surgery

## 2011-05-23 ENCOUNTER — Ambulatory Visit (HOSPITAL_COMMUNITY)
Admission: RE | Admit: 2011-05-23 | Discharge: 2011-05-23 | Disposition: A | Discharge status: Home / Self Care | Payer: Medicare Other | Source: Ambulatory Visit | Attending: Orthopedic Surgery | Admitting: Orthopedic Surgery

## 2011-05-23 DIAGNOSIS — Z01811 Encounter for preprocedural respiratory examination: Secondary | ICD-10-CM

## 2011-05-23 DIAGNOSIS — T8489XA Other specified complication of internal orthopedic prosthetic devices, implants and grafts, initial encounter: Principal | ICD-10-CM | POA: Diagnosis present

## 2011-05-23 DIAGNOSIS — Z96659 Presence of unspecified artificial knee joint: Secondary | ICD-10-CM

## 2011-05-23 DIAGNOSIS — Y849 Medical procedure, unspecified as the cause of abnormal reaction of the patient, or of later complication, without mention of misadventure at the time of the procedure: Secondary | ICD-10-CM | POA: Diagnosis present

## 2011-05-23 LAB — GRAM STAIN

## 2011-05-24 LAB — CBC
HCT: 26.5 % — ABNORMAL LOW (ref 39.0–52.0)
Hemoglobin: 8.8 g/dL — ABNORMAL LOW (ref 13.0–17.0)
MCH: 26.6 pg (ref 26.0–34.0)
MCHC: 33.2 g/dL (ref 30.0–36.0)
MCV: 80.1 fL (ref 78.0–100.0)
Platelets: 129 10*3/uL — ABNORMAL LOW (ref 150–400)
RBC: 3.31 MIL/uL — ABNORMAL LOW (ref 4.22–5.81)
RDW: 14.2 % (ref 11.5–15.5)
WBC: 6 10*3/uL (ref 4.0–10.5)

## 2011-05-24 LAB — BASIC METABOLIC PANEL
BUN: 18 mg/dL (ref 6–23)
CO2: 28 mEq/L (ref 19–32)
Calcium: 8.1 mg/dL — ABNORMAL LOW (ref 8.4–10.5)
Chloride: 104 mEq/L (ref 96–112)
Creatinine, Ser: 0.74 mg/dL (ref 0.50–1.35)
GFR calc Af Amer: >90 mL/min (ref >90)
GFR calc non Af Amer: >90 mL/min (ref >90)
Glucose, Bld: 134 mg/dL — ABNORMAL HIGH (ref 70–99)
Potassium: 4.8 mEq/L (ref 3.5–5.1)
Sodium: 135 mEq/L (ref 135–145)

## 2011-05-24 LAB — PROTIME-INR
INR: 1.18 (ref 0.00–1.49)
Prothrombin Time: 15.2 seconds (ref 11.6–15.2)

## 2011-05-25 LAB — CBC
HCT: 23.9 % — ABNORMAL LOW (ref 39.0–52.0)
Hemoglobin: 8.1 g/dL — ABNORMAL LOW (ref 13.0–17.0)
MCH: 27.2 pg (ref 26.0–34.0)
MCHC: 33.9 g/dL (ref 30.0–36.0)
MCV: 80.2 fL (ref 78.0–100.0)
Platelets: 122 10*3/uL — ABNORMAL LOW (ref 150–400)
RBC: 2.98 MIL/uL — ABNORMAL LOW (ref 4.22–5.81)
RDW: 14.6 % (ref 11.5–15.5)
WBC: 5.3 10*3/uL (ref 4.0–10.5)

## 2011-05-25 LAB — PROTIME-INR
INR: 1.52 — ABNORMAL HIGH (ref 0.00–1.49)
Prothrombin Time: 18.6 seconds — ABNORMAL HIGH (ref 11.6–15.2)

## 2011-05-26 LAB — TISSUE CULTURE: Culture: NO GROWTH

## 2011-05-26 NOTE — Op Note (Signed)
NAMEMarland Kitchen  SHAUGHN, THOMLEY NO.:  [id number]  MEDICAL RECORD NO.:  [id number]  LOCATION:  5010                         FACILITY:  [facility]  PHYSICIAN:  Feliberto Gottron. Turner Daniels, M.D.   DATE OF BIRTH:  28-Aug-1940  DATE OF PROCEDURE:  05/23/2011 DATE OF DISCHARGE:                              OPERATIVE REPORT   PREOPERATIVE DIAGNOSIS:  Status post infected right total knee.  POSTOPERATIVE DIAGNOSIS:  Status post infected right total knee.  PROCEDURE:  Revision total knee with removal of methacrylate prosthesis and revision to a DePuy #5 MBT tray with a 14 x 115 femoral stem.  On the tibial side a 5 MBT tray with a 12 x 75 tibial stem.  A 17.5 MBT bearing and a 41-mm patellar button.  All components cemented, triple batch DePuy HV cement with 1500 mg Zinacef.  SURGEON:  Feliberto Gottron.  Turner Daniels, MD  FIRST ASSISTANT:  Shirl Harris PA-C.  ANESTHETIC:  General endotracheal.  ESTIMATED BLOOD LOSS:  500 mL.  FLUID REPLACEMENT:  2000 mL of crystalloid.  DRAINS PLACED:  Foley catheter and 2 medium Hemovacs.  URINE OUTPUT:  300 mL.  TOURNIQUET TIME:  1 hour.  INDICATIONS FOR PROCEDURE:  The patient is a 71 year old gentleman, who had an infected right total knee that had undergo removal of implants and placement of an antibiotic impregnated methacrylate spacer/prosthesis back in June 2012.  He received 6 weeks of already vancomycin for methicillin-resistant staph epidermis, it is actually pubis and not vancomycin the reaction to vancomycin and then was on p.o. doxycycline for another 6 weeks.  After completion of oral antibiotics, we checked a CBC, sed rate and CRP, these were normal  aspiration of the joint 2 weeks of antibiotics revealed no organisms and he was taken for removal of the methacrylate implants and revision to a total knee MBT GC3 design from DePuy.  Risks and benefits of surgery well-known to the patient and he is prepared for surgical intervention.  DESCRIPTION  OF PROCEDURE:  The patient was identified by armband and received preoperative IV antibiotics in the holding area at [facility].  He received right femoral nerve block, taken to the operating room, one appropriate site monitors were attached, general endotracheal anesthesia was induced.  The patient was in supine position.  Foley catheter was inserted.  Tourniquet applied high the right thigh and the right lower extremity was then prepped and draped in sterile fashion from the ankle to the tourniquet.  Time-out procedure was performed and we began the operation with the tourniquet down.  Anterior midline incision was started about 8 cm above the patella, was nor over the patella, and 1 cm medial given 4 cm distal to the tibial tubercle. Through skin and subcutaneous tissue, small bleeders were identified and cauterized.  The fascia was then elevated and a thick flap from centerline to medial, and medial parapatellar arthrotomy was accomplished.  The joint fluid appeared to be clear with no purulence. We did set about removing a large amounts of scar tissue from all quadrants of the total knee and gradually cut back to normal-appearing tissue allowing [place] to mobilize the patella and removed the antibiotic spacer.  This was  done piecemeal with large osteotomes.  The implant was placed in the patella was also removed.  We gradually worked our way around from anterior to medial and posteromedial around the tibial allowing [place] to externally rotate.  We were finally able to evert the patella, hyperflexed the knee, giving [place] good exposure of all tissues. We then began to the work around the posterior condyles of the femur and working around the posterior aspect of the proximal tibia, completing our exposure.  We then entered the proximal tibia with a DePuy step drill followed by the hand reamers up to a 12 reamer to the appropriate depth for 75 stem with the reamer in place.  We applied the  2 degrees posterior slope cutting guide and performed a cut to remove maybe 1 mm of bone from the high side and did leave some bony defects that were easily cemental, there were no open defects on the tibia.  We then directed our attention to the distal femur and was likewise entered this with a DePuy step drill followed by the hand reamers going up to the appropriate depth for 14 x 115 femoral stem.  There was some deficiency noted at the lateral femoral condyle that appeared to be about 3-4 mm again cement able stem.  With a reamer in place, we then performed a distal femoral cut and the chamfer cuts and box cuts pinned along the epicondylar axis for #5 femoral component.  The everted patella was then grasped and 1 mm of bone resected from the back of the patella size of 41 button and drill.  At this point, we once again hyperflexed the knee sized for #5 MBT tray was applied.  The smokestack and then the conical reamer as well.  The conical reamer trial.  We then assembled trial 5 mEq tray and a 12 x 75 stem inserted on the tibia, on the femoral side with assembled a 5 mL free femoral component, right with a 14 x 115 trial stem and inserted this and had good fit and fill.  We then checked our stability with 15 and 17.5 spacer, as a 17.5 had the best stability to fit and fill.  Then he came to full extension flexed to 130 degrees and the patella trial tracked with thumb pressure.  At this time, the Gram stain came back with occasional monos and polys with no organisms. We then continued preparation by thoroughly irrigating all tissues and dried with suction sponges.  Meanwhile the back table a triple batch of DePuy a vancomycin was mixed and applied to all bony metallic mating surfaces in order we hammered into place a 5 MBT tray with a 12.75 stem on the femoral, side a five right TC3 prosthesis with a 14 x 115 stem and removed excess cement.  We then inserted the 17.5 MBT  rotating platform bearing and squeezed a 41-mm patellar button into place.  The knee was then held full extension and compression while we irrigated out with normal saline solution and placed drains from anterolateral approach.  At this point after the cement hard, we checked our tracking one more time and then closed in layers with running #1 Vicryl suture and the parapatellar arthrotomy, 0-0 and 2-0 undyed Vicryl suture and the subcutaneous tissues and skin staples.  A dressing of Xeroform 4x4 dressing sponges, Webril, Ace wrap and a short knee immobilizer was applied.  The patient awakened, extubated and taken to the recovery room without difficulty.     Homero Fellers  Tawni Carnes, M.D.     Ovid Curd  D:  05/24/2011  T:  05/24/2011  Job:  [id number]  Electronically Signed by Gean Birchwood M.D. on 05/26/2011 09:57:31 PM

## 2011-05-28 LAB — ANAEROBIC CULTURE

## 2011-06-18 NOTE — Discharge Summary (Signed)
  NAMEMarland Kitchen  DEJOHN, Gerald NO.:  [id number]  MEDICAL RECORD NO.:  [id number]  LOCATION:  5010                         FACILITY:  [facility]  PHYSICIAN:  Feliberto Gottron. Turner Daniels, M.D.   DATE OF BIRTH:  [birth year]  DATE OF ADMISSION:  05/23/2011 DATE OF DISCHARGE:  05/25/2011                              DISCHARGE SUMMARY   CHIEF COMPLAINT:  Right knee pain.  HISTORY OF PRESENT ILLNESS:  This is a 70 year old gentleman who underwent removal of an infected right total knee and placement of a cement spacer on February 03, 2011.  He recently had an aspiration that yielded no evidence of infection, so he now desires surgical reimplantation of his total knee.  All risks and benefits of surgery were discussed with the patient.  PAST MEDICAL HISTORY:  Significant for right total knee arthroplasty and 2 revision surgeries.  SOCIAL HISTORY:  He denies use of tobacco or alcohol.  FAMILY HISTORY:  Positive for coronary artery disease.  PHYSICAL EXAMINATION:  Gross examination of the right knee demonstrates a well-healed surgical incision.  Range of motion is 0-25 degrees.  IMAGING:  X-rays of the right knee demonstrate an intact cement spacer.  PREOPERATIVE LABORATORY DATA:  White blood cells 5.4, red blood cells 4.87, hemoglobin 13.5, hematocrit 40.5, platelets 172.  HOSPITAL COURSE:  Gerald Spears was admitted to [facility] on May 23, 2011 when he underwent reimplantation of his right total knee component. The procedure was performed by Dr. Gean Birchwood and the patient tolerated it well.  Two Hemovac drains were placed into the knee.  A perioperative Foley catheter was also placed.  Then he was transferred to the floor on Lovenox and Coumadin for DVT prophylaxis.  On the first postoperative day, he was awake and alert and reporting fairly good pain control. Hemoglobin was 8.8.  His Foley catheter was removed after physical therapy.  On the second postoperative day, hemoglobin was 8.1  but he denied any dizziness or shortness of breath.  He had met all of his physical therapy goals and was discharged home.  DISPOSITION:  The patient was discharged home on May 25, 2011.  He was weightbearing as tolerated.  Home Health Care would manage his wound, Coumadin, and physical therapy.  He would return to the clinic in 12 days for x-rays and staple removal.  FINAL DIAGNOSIS:  Status post right total knee infection.     Shirl Harris, PA   ______________________________ Feliberto Gottron. Turner Daniels, M.D.    JW/MEDQ  D:  06/11/2011  T:  06/11/2011  Job:  [id number]  Electronically Signed by Shirl Harris PA on 06/17/2011 12:57:53 PM Electronically Signed by Gean Birchwood M.D. on 06/18/2011 06:34:00 PM

## 2011-08-04 DIAGNOSIS — L570 Actinic keratosis: Secondary | ICD-10-CM | POA: Insufficient documentation

## 2011-08-22 DIAGNOSIS — M25569 Pain in unspecified knee: Secondary | ICD-10-CM | POA: Diagnosis not present

## 2011-08-22 DIAGNOSIS — M25579 Pain in unspecified ankle and joints of unspecified foot: Secondary | ICD-10-CM | POA: Diagnosis not present

## 2011-08-29 DIAGNOSIS — M25579 Pain in unspecified ankle and joints of unspecified foot: Secondary | ICD-10-CM | POA: Diagnosis not present

## 2011-08-29 DIAGNOSIS — M19079 Primary osteoarthritis, unspecified ankle and foot: Secondary | ICD-10-CM | POA: Diagnosis not present

## 2011-08-29 DIAGNOSIS — M7750 Other enthesopathy of unspecified foot: Secondary | ICD-10-CM | POA: Diagnosis not present

## 2011-10-15 DIAGNOSIS — N390 Urinary tract infection, site not specified: Secondary | ICD-10-CM | POA: Diagnosis not present

## 2011-10-15 DIAGNOSIS — R350 Frequency of micturition: Secondary | ICD-10-CM | POA: Diagnosis not present

## 2011-12-31 DIAGNOSIS — M545 Low back pain, unspecified: Secondary | ICD-10-CM | POA: Diagnosis not present

## 2011-12-31 DIAGNOSIS — M1710 Unilateral primary osteoarthritis, unspecified knee: Secondary | ICD-10-CM | POA: Diagnosis not present

## 2012-01-01 DIAGNOSIS — M545 Low back pain, unspecified: Secondary | ICD-10-CM | POA: Diagnosis not present

## 2012-01-06 DIAGNOSIS — M545 Low back pain, unspecified: Secondary | ICD-10-CM | POA: Diagnosis not present

## 2012-01-06 DIAGNOSIS — M1710 Unilateral primary osteoarthritis, unspecified knee: Secondary | ICD-10-CM | POA: Diagnosis not present

## 2012-01-08 DIAGNOSIS — M545 Low back pain, unspecified: Secondary | ICD-10-CM | POA: Diagnosis not present

## 2012-01-08 DIAGNOSIS — M1710 Unilateral primary osteoarthritis, unspecified knee: Secondary | ICD-10-CM | POA: Diagnosis not present

## 2012-01-15 DIAGNOSIS — M545 Low back pain, unspecified: Secondary | ICD-10-CM | POA: Diagnosis not present

## 2012-01-15 DIAGNOSIS — M1710 Unilateral primary osteoarthritis, unspecified knee: Secondary | ICD-10-CM | POA: Diagnosis not present

## 2012-02-06 DIAGNOSIS — Z0000 Encounter for general adult medical examination without abnormal findings: Secondary | ICD-10-CM | POA: Diagnosis not present

## 2012-02-06 DIAGNOSIS — R5381 Other malaise: Secondary | ICD-10-CM | POA: Diagnosis not present

## 2012-02-06 DIAGNOSIS — N390 Urinary tract infection, site not specified: Secondary | ICD-10-CM | POA: Diagnosis not present

## 2012-02-06 DIAGNOSIS — N529 Male erectile dysfunction, unspecified: Secondary | ICD-10-CM | POA: Diagnosis not present

## 2012-02-06 DIAGNOSIS — Z125 Encounter for screening for malignant neoplasm of prostate: Secondary | ICD-10-CM | POA: Diagnosis not present

## 2012-02-06 DIAGNOSIS — Z8249 Family history of ischemic heart disease and other diseases of the circulatory system: Secondary | ICD-10-CM | POA: Diagnosis not present

## 2012-02-06 DIAGNOSIS — R5383 Other fatigue: Secondary | ICD-10-CM | POA: Diagnosis not present

## 2012-02-06 DIAGNOSIS — N400 Enlarged prostate without lower urinary tract symptoms: Secondary | ICD-10-CM | POA: Diagnosis not present

## 2012-02-13 DIAGNOSIS — M79609 Pain in unspecified limb: Secondary | ICD-10-CM | POA: Diagnosis not present

## 2012-02-25 ENCOUNTER — Encounter (HOSPITAL_BASED_OUTPATIENT_CLINIC_OR_DEPARTMENT_OTHER): Payer: Self-pay | Admitting: *Deleted

## 2012-02-25 ENCOUNTER — Emergency Department (HOSPITAL_BASED_OUTPATIENT_CLINIC_OR_DEPARTMENT_OTHER)
Admission: EM | Admit: 2012-02-25 | Discharge: 2012-02-25 | Disposition: A | Discharge status: Home / Self Care | Payer: Medicare Other | Attending: Emergency Medicine | Admitting: Emergency Medicine

## 2012-02-25 DIAGNOSIS — W278XXA Contact with other nonpowered hand tool, initial encounter: Secondary | ICD-10-CM | POA: Insufficient documentation

## 2012-02-25 DIAGNOSIS — M79609 Pain in unspecified limb: Secondary | ICD-10-CM | POA: Diagnosis not present

## 2012-02-25 DIAGNOSIS — S61209A Unspecified open wound of unspecified finger without damage to nail, initial encounter: Secondary | ICD-10-CM | POA: Diagnosis not present

## 2012-02-25 DIAGNOSIS — S61218A Laceration without foreign body of other finger without damage to nail, initial encounter: Secondary | ICD-10-CM

## 2012-02-25 HISTORY — DX: Urinary tract infection, site not specified: N39.0

## 2012-02-25 MED ORDER — TETANUS-DIPHTH-ACELL PERTUSSIS 5-2.5-18.5 LF-MCG/0.5 IM SUSP
0.5000 mL | Freq: Once | INTRAMUSCULAR | Status: AC
  Administered 2012-02-25: 0.5 mL via INTRAMUSCULAR
  Filled 2012-02-25: qty 0.5
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
## 2012-02-25 NOTE — ED Provider Notes (Signed)
Medical screening examination/treatment/procedure(s) were performed by non-physician practitioner and as supervising physician I was immediately available for consultation/collaboration.   Jazmin Vensel, MD 02/25/12 2307 
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
## 2012-02-25 NOTE — ED Notes (Signed)
Wound care per this RN. Bandage placed with after care instructions given. Voiced understanding.

## 2012-02-25 NOTE — ED Notes (Signed)
MD at bedside. 

## 2012-03-18 DIAGNOSIS — R972 Elevated prostate specific antigen [PSA]: Secondary | ICD-10-CM | POA: Diagnosis not present

## 2012-05-12 DIAGNOSIS — Z23 Encounter for immunization: Secondary | ICD-10-CM | POA: Diagnosis not present

## 2012-05-12 DIAGNOSIS — R972 Elevated prostate specific antigen [PSA]: Secondary | ICD-10-CM | POA: Diagnosis not present

## 2012-08-04 DIAGNOSIS — L821 Other seborrheic keratosis: Secondary | ICD-10-CM | POA: Diagnosis not present

## 2012-08-04 DIAGNOSIS — R05 Cough: Secondary | ICD-10-CM | POA: Diagnosis not present

## 2012-08-04 DIAGNOSIS — R059 Cough, unspecified: Secondary | ICD-10-CM | POA: Diagnosis not present

## 2012-08-04 DIAGNOSIS — L570 Actinic keratosis: Secondary | ICD-10-CM | POA: Diagnosis not present

## 2012-10-17 DIAGNOSIS — S335XXA Sprain of ligaments of lumbar spine, initial encounter: Secondary | ICD-10-CM | POA: Diagnosis not present

## 2012-10-21 DIAGNOSIS — R071 Chest pain on breathing: Secondary | ICD-10-CM | POA: Diagnosis not present

## 2012-10-21 DIAGNOSIS — N400 Enlarged prostate without lower urinary tract symptoms: Secondary | ICD-10-CM | POA: Diagnosis not present

## 2012-11-26 DIAGNOSIS — Z125 Encounter for screening for malignant neoplasm of prostate: Secondary | ICD-10-CM | POA: Diagnosis not present

## 2012-12-07 DIAGNOSIS — H04129 Dry eye syndrome of unspecified lacrimal gland: Secondary | ICD-10-CM | POA: Diagnosis not present

## 2012-12-28 DIAGNOSIS — M25579 Pain in unspecified ankle and joints of unspecified foot: Secondary | ICD-10-CM | POA: Diagnosis not present

## 2012-12-28 DIAGNOSIS — M7750 Other enthesopathy of unspecified foot: Secondary | ICD-10-CM | POA: Diagnosis not present

## 2013-02-04 DIAGNOSIS — R5381 Other malaise: Secondary | ICD-10-CM | POA: Diagnosis not present

## 2013-02-04 DIAGNOSIS — R5383 Other fatigue: Secondary | ICD-10-CM | POA: Diagnosis not present

## 2013-03-09 DIAGNOSIS — M1710 Unilateral primary osteoarthritis, unspecified knee: Secondary | ICD-10-CM | POA: Diagnosis not present

## 2013-05-24 DIAGNOSIS — Z23 Encounter for immunization: Secondary | ICD-10-CM | POA: Diagnosis not present

## 2013-05-24 DIAGNOSIS — G479 Sleep disorder, unspecified: Secondary | ICD-10-CM | POA: Diagnosis not present

## 2013-05-24 DIAGNOSIS — M720 Palmar fascial fibromatosis [Dupuytren]: Secondary | ICD-10-CM | POA: Diagnosis not present

## 2013-06-23 DIAGNOSIS — M1710 Unilateral primary osteoarthritis, unspecified knee: Secondary | ICD-10-CM | POA: Diagnosis not present

## 2013-08-24 DIAGNOSIS — H903 Sensorineural hearing loss, bilateral: Secondary | ICD-10-CM | POA: Diagnosis not present

## 2013-09-06 DIAGNOSIS — M25569 Pain in unspecified knee: Secondary | ICD-10-CM | POA: Diagnosis not present

## 2013-12-03 DIAGNOSIS — S61209A Unspecified open wound of unspecified finger without damage to nail, initial encounter: Secondary | ICD-10-CM | POA: Diagnosis not present

## 2013-12-03 DIAGNOSIS — Z23 Encounter for immunization: Secondary | ICD-10-CM | POA: Diagnosis not present

## 2013-12-03 DIAGNOSIS — S62609A Fracture of unspecified phalanx of unspecified finger, initial encounter for closed fracture: Secondary | ICD-10-CM | POA: Diagnosis not present

## 2013-12-03 DIAGNOSIS — S62609B Fracture of unspecified phalanx of unspecified finger, initial encounter for open fracture: Secondary | ICD-10-CM | POA: Diagnosis not present

## 2013-12-03 DIAGNOSIS — S62639A Displaced fracture of distal phalanx of unspecified finger, initial encounter for closed fracture: Secondary | ICD-10-CM | POA: Diagnosis not present

## 2013-12-03 DIAGNOSIS — S62639B Displaced fracture of distal phalanx of unspecified finger, initial encounter for open fracture: Secondary | ICD-10-CM | POA: Diagnosis not present

## 2013-12-07 DIAGNOSIS — S61209A Unspecified open wound of unspecified finger without damage to nail, initial encounter: Secondary | ICD-10-CM | POA: Diagnosis not present

## 2013-12-15 DIAGNOSIS — S61209A Unspecified open wound of unspecified finger without damage to nail, initial encounter: Secondary | ICD-10-CM | POA: Diagnosis not present

## 2014-01-02 DIAGNOSIS — N529 Male erectile dysfunction, unspecified: Secondary | ICD-10-CM | POA: Diagnosis not present

## 2014-01-02 DIAGNOSIS — R42 Dizziness and giddiness: Secondary | ICD-10-CM | POA: Diagnosis not present

## 2014-01-24 DIAGNOSIS — S61209A Unspecified open wound of unspecified finger without damage to nail, initial encounter: Secondary | ICD-10-CM | POA: Diagnosis not present

## 2014-03-21 DIAGNOSIS — M1710 Unilateral primary osteoarthritis, unspecified knee: Secondary | ICD-10-CM | POA: Diagnosis not present

## 2014-03-21 DIAGNOSIS — M25569 Pain in unspecified knee: Secondary | ICD-10-CM | POA: Diagnosis not present

## 2014-04-07 DIAGNOSIS — H25099 Other age-related incipient cataract, unspecified eye: Secondary | ICD-10-CM | POA: Diagnosis not present

## 2014-05-08 DIAGNOSIS — K120 Recurrent oral aphthae: Secondary | ICD-10-CM | POA: Diagnosis not present

## 2014-05-09 DIAGNOSIS — C4359 Malignant melanoma of other part of trunk: Secondary | ICD-10-CM | POA: Diagnosis not present

## 2014-05-09 DIAGNOSIS — D485 Neoplasm of uncertain behavior of skin: Secondary | ICD-10-CM | POA: Diagnosis not present

## 2014-05-09 DIAGNOSIS — L989 Disorder of the skin and subcutaneous tissue, unspecified: Secondary | ICD-10-CM | POA: Diagnosis not present

## 2014-05-19 DIAGNOSIS — Z23 Encounter for immunization: Secondary | ICD-10-CM | POA: Diagnosis not present

## 2014-06-01 DIAGNOSIS — D0359 Melanoma in situ of other part of trunk: Secondary | ICD-10-CM | POA: Diagnosis not present

## 2014-06-15 DIAGNOSIS — Z4802 Encounter for removal of sutures: Secondary | ICD-10-CM | POA: Diagnosis not present

## 2014-06-22 DIAGNOSIS — Z96659 Presence of unspecified artificial knee joint: Secondary | ICD-10-CM | POA: Diagnosis not present

## 2014-06-22 DIAGNOSIS — M1990 Unspecified osteoarthritis, unspecified site: Secondary | ICD-10-CM | POA: Diagnosis not present

## 2014-10-17 DIAGNOSIS — M1712 Unilateral primary osteoarthritis, left knee: Secondary | ICD-10-CM | POA: Diagnosis not present

## 2014-10-24 DIAGNOSIS — M1712 Unilateral primary osteoarthritis, left knee: Secondary | ICD-10-CM | POA: Diagnosis not present

## 2014-10-31 DIAGNOSIS — M1712 Unilateral primary osteoarthritis, left knee: Secondary | ICD-10-CM | POA: Diagnosis not present

## 2014-11-07 DIAGNOSIS — M1712 Unilateral primary osteoarthritis, left knee: Secondary | ICD-10-CM | POA: Diagnosis not present

## 2014-11-21 DIAGNOSIS — M1712 Unilateral primary osteoarthritis, left knee: Secondary | ICD-10-CM | POA: Diagnosis not present

## 2014-12-12 DIAGNOSIS — M1712 Unilateral primary osteoarthritis, left knee: Secondary | ICD-10-CM | POA: Diagnosis not present

## 2015-01-09 DIAGNOSIS — K5730 Diverticulosis of large intestine without perforation or abscess without bleeding: Secondary | ICD-10-CM | POA: Diagnosis not present

## 2015-01-09 DIAGNOSIS — K621 Rectal polyp: Secondary | ICD-10-CM | POA: Diagnosis not present

## 2015-01-09 DIAGNOSIS — D125 Benign neoplasm of sigmoid colon: Secondary | ICD-10-CM | POA: Diagnosis not present

## 2015-01-09 DIAGNOSIS — K640 First degree hemorrhoids: Secondary | ICD-10-CM | POA: Diagnosis not present

## 2015-01-09 DIAGNOSIS — Z86010 Personal history of colonic polyps: Secondary | ICD-10-CM | POA: Diagnosis not present

## 2015-01-09 DIAGNOSIS — D126 Benign neoplasm of colon, unspecified: Secondary | ICD-10-CM | POA: Diagnosis not present

## 2015-01-09 DIAGNOSIS — Z09 Encounter for follow-up examination after completed treatment for conditions other than malignant neoplasm: Secondary | ICD-10-CM | POA: Diagnosis not present

## 2015-02-22 DIAGNOSIS — M1712 Unilateral primary osteoarthritis, left knee: Secondary | ICD-10-CM | POA: Diagnosis not present

## 2015-03-28 DIAGNOSIS — M1712 Unilateral primary osteoarthritis, left knee: Secondary | ICD-10-CM | POA: Diagnosis not present

## 2015-03-29 ENCOUNTER — Emergency Department (HOSPITAL_BASED_OUTPATIENT_CLINIC_OR_DEPARTMENT_OTHER)
Admission: EM | Admit: 2015-03-29 | Discharge: 2015-03-29 | Disposition: A | Discharge status: Home / Self Care | Payer: Medicare Other | Attending: Emergency Medicine | Admitting: Emergency Medicine

## 2015-03-29 ENCOUNTER — Encounter (HOSPITAL_BASED_OUTPATIENT_CLINIC_OR_DEPARTMENT_OTHER): Payer: Self-pay | Admitting: *Deleted

## 2015-03-29 DIAGNOSIS — T2220XA Burn of second degree of shoulder and upper limb, except wrist and hand, unspecified site, initial encounter: Secondary | ICD-10-CM

## 2015-03-29 DIAGNOSIS — T23231A Burn of second degree of multiple right fingers (nail), not including thumb, initial encounter: Secondary | ICD-10-CM | POA: Diagnosis not present

## 2015-03-29 DIAGNOSIS — Z8739 Personal history of other diseases of the musculoskeletal system and connective tissue: Secondary | ICD-10-CM | POA: Diagnosis not present

## 2015-03-29 DIAGNOSIS — Z792 Long term (current) use of antibiotics: Secondary | ICD-10-CM | POA: Insufficient documentation

## 2015-03-29 DIAGNOSIS — Z87440 Personal history of urinary (tract) infections: Secondary | ICD-10-CM | POA: Diagnosis not present

## 2015-03-29 DIAGNOSIS — Z79899 Other long term (current) drug therapy: Secondary | ICD-10-CM | POA: Diagnosis not present

## 2015-03-29 DIAGNOSIS — Y998 Other external cause status: Secondary | ICD-10-CM | POA: Insufficient documentation

## 2015-03-29 DIAGNOSIS — T24132A Burn of first degree of left lower leg, initial encounter: Secondary | ICD-10-CM | POA: Insufficient documentation

## 2015-03-29 DIAGNOSIS — Y9389 Activity, other specified: Secondary | ICD-10-CM | POA: Insufficient documentation

## 2015-03-29 DIAGNOSIS — Y9289 Other specified places as the place of occurrence of the external cause: Secondary | ICD-10-CM | POA: Diagnosis not present

## 2015-03-29 DIAGNOSIS — T2010XA Burn of first degree of head, face, and neck, unspecified site, initial encounter: Secondary | ICD-10-CM | POA: Insufficient documentation

## 2015-03-29 DIAGNOSIS — T22011A Burn of unspecified degree of right forearm, initial encounter: Secondary | ICD-10-CM | POA: Diagnosis present

## 2015-03-29 DIAGNOSIS — X088XXA Exposure to other specified smoke, fire and flames, initial encounter: Secondary | ICD-10-CM | POA: Insufficient documentation

## 2015-03-29 DIAGNOSIS — T22211A Burn of second degree of right forearm, initial encounter: Secondary | ICD-10-CM | POA: Diagnosis not present

## 2015-03-29 DIAGNOSIS — T300 Burn of unspecified body region, unspecified degree: Secondary | ICD-10-CM

## 2015-03-29 DIAGNOSIS — T24131A Burn of first degree of right lower leg, initial encounter: Secondary | ICD-10-CM | POA: Diagnosis not present

## 2015-03-29 DIAGNOSIS — T3110 Burns involving 10-19% of body surface with 0% to 9% third degree burns: Secondary | ICD-10-CM

## 2015-03-29 MED ORDER — SILVER SULFADIAZINE 1 % EX CREA
TOPICAL_CREAM | Freq: Once | CUTANEOUS | Status: AC
  Administered 2015-03-29: 14:00:00 via TOPICAL
  Filled 2015-03-29: qty 85
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
## 2015-03-29 NOTE — ED Notes (Signed)
Patient preparing for discharge. 

## 2015-03-29 NOTE — ED Notes (Signed)
MD at bedside. 
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
## 2015-04-03 DIAGNOSIS — N529 Male erectile dysfunction, unspecified: Secondary | ICD-10-CM | POA: Diagnosis not present

## 2015-04-03 DIAGNOSIS — N400 Enlarged prostate without lower urinary tract symptoms: Secondary | ICD-10-CM | POA: Diagnosis not present

## 2015-04-03 DIAGNOSIS — Z23 Encounter for immunization: Secondary | ICD-10-CM | POA: Diagnosis not present

## 2015-04-03 DIAGNOSIS — T300 Burn of unspecified body region, unspecified degree: Secondary | ICD-10-CM | POA: Diagnosis not present

## 2015-04-03 DIAGNOSIS — E785 Hyperlipidemia, unspecified: Secondary | ICD-10-CM | POA: Diagnosis not present

## 2015-05-03 ENCOUNTER — Other Ambulatory Visit: Payer: Self-pay | Admitting: Orthopedic Surgery

## 2015-05-03 DIAGNOSIS — M1712 Unilateral primary osteoarthritis, left knee: Secondary | ICD-10-CM | POA: Diagnosis not present

## 2015-05-15 DIAGNOSIS — Z87898 Personal history of other specified conditions: Secondary | ICD-10-CM | POA: Diagnosis not present

## 2015-05-15 DIAGNOSIS — Z85820 Personal history of malignant melanoma of skin: Secondary | ICD-10-CM | POA: Diagnosis not present

## 2015-05-15 DIAGNOSIS — L570 Actinic keratosis: Secondary | ICD-10-CM | POA: Diagnosis not present

## 2015-06-01 DIAGNOSIS — Z23 Encounter for immunization: Secondary | ICD-10-CM | POA: Diagnosis not present

## 2015-06-06 ENCOUNTER — Other Ambulatory Visit (HOSPITAL_COMMUNITY): Payer: Self-pay | Admitting: *Deleted

## 2015-06-06 NOTE — Pre-Procedure Instructions (Addendum)
Aadvik C [place]  06/06/2015      [facility] - [place], [place] [address] Phone: [phone] Fax: [phone]    Your procedure is scheduled on Monday, June 18, 2015 at 7:30 AM.  Report to [facility] Entrance "A" Admitting Office at 5:30 AM.  Call this number if you have problems the morning of surgery: [phone]  Any questions prior to day of surgery, please call [phone] between 8 & 4 PM.   Remember:  Do not eat food or drink liquids after midnight Sunday, 06/17/15.  Take these medicines the morning of surgery with A SIP OF WATER: Tamsulosin (Flomax)  Stop Multivitamins 7 days prior to surgery.   Do not wear jewelry.  Do not wear lotions, powders, or cologne.  You may wear deodorant.  Men may shave face and neck.  Do not bring valuables to the hospital.  [facility] is not responsible for any belongings or valuables.  Contacts, dentures or bridgework may not be worn into surgery.  Leave your suitcase in the car.  After surgery it may be brought to your room.  For patients admitted to the hospital, discharge time will be determined by your treatment team.  Special instructions:  [facility] - Preparing for Surgery  Before surgery, you can play an important role.  Because skin is not sterile, your skin needs to be as free of germs as possible.  You can reduce the number of germs on you skin by washing with CHG (chlorahexidine gluconate) soap before surgery.  CHG is an antiseptic cleaner which kills germs and bonds with the skin to continue killing germs even after washing.  Please DO NOT use if you have an allergy to CHG or antibacterial soaps.  If your skin becomes reddened/irritated stop using the CHG and inform your nurse when you arrive at Short Stay.  Do not shave (including legs and underarms) for at least 48 hours prior to the first CHG shower.  You may shave your face.  Please follow these  instructions carefully:   1.  Shower with CHG Soap the night before surgery and the  morning of Surgery.  2.  If you choose to wash your hair, wash your hair first as usual with your normal shampoo.  3.  After you shampoo, rinse your hair and body thoroughly to remove the Shampoo.  4.  Use CHG as you would any other liquid soap.  You can apply chg directly to the skin and wash gently with scrungie or a clean washcloth.  5.  Apply the CHG Soap to your body ONLY FROM THE NECK DOWN.   Do not use on open wounds or open sores.  Avoid contact with your eyes, ears, mouth and genitals (private parts).  Wash genitals (private parts) with your normal soap.  6.  Wash thoroughly, paying special attention to the area where your surgery will be performed.  7.  Thoroughly rinse your body with warm water from the neck down.  8.  DO NOT shower/wash with your normal soap after using and rinsing off the CHG Soap.  9.  Pat yourself dry with a clean towel.            10.  Wear clean pajamas.            11 .  Place clean sheets on your bed the night of your first shower and do not sleep with  pets.  Day of Surgery  Do not apply any lotions the morning of surgery.  Please wear clean clothes to the hospital.   Please read over the following fact sheets that you were given. Pain Booklet, Coughing and Deep Breathing, Blood Transfusion Information, MRSA Information and Surgical Site Infection Prevention

## 2015-06-07 ENCOUNTER — Encounter (HOSPITAL_COMMUNITY)
Admission: RE | Admit: 2015-06-07 | Discharge: 2015-06-07 | Disposition: A | Discharge status: Home / Self Care | Payer: Medicare Other | Source: Ambulatory Visit | Attending: Orthopedic Surgery | Admitting: Orthopedic Surgery

## 2015-06-07 ENCOUNTER — Ambulatory Visit (HOSPITAL_COMMUNITY)
Admission: RE | Admit: 2015-06-07 | Discharge: 2015-06-07 | Disposition: A | Discharge status: Home / Self Care | Payer: Medicare Other | Source: Ambulatory Visit | Attending: Orthopedic Surgery | Admitting: Orthopedic Surgery

## 2015-06-07 ENCOUNTER — Encounter (HOSPITAL_COMMUNITY): Payer: Self-pay

## 2015-06-07 DIAGNOSIS — Z01818 Encounter for other preprocedural examination: Secondary | ICD-10-CM | POA: Insufficient documentation

## 2015-06-07 DIAGNOSIS — Z01812 Encounter for preprocedural laboratory examination: Secondary | ICD-10-CM | POA: Insufficient documentation

## 2015-06-07 DIAGNOSIS — I498 Other specified cardiac arrhythmias: Secondary | ICD-10-CM | POA: Diagnosis not present

## 2015-06-07 DIAGNOSIS — M179 Osteoarthritis of knee, unspecified: Secondary | ICD-10-CM | POA: Insufficient documentation

## 2015-06-07 DIAGNOSIS — Z0183 Encounter for blood typing: Secondary | ICD-10-CM | POA: Insufficient documentation

## 2015-06-07 DIAGNOSIS — Z9889 Other specified postprocedural states: Secondary | ICD-10-CM | POA: Diagnosis not present

## 2015-06-07 HISTORY — DX: Malignant (primary) neoplasm, unspecified: C80.1

## 2015-06-07 HISTORY — DX: Unspecified hearing loss, unspecified ear: H91.90

## 2015-06-07 HISTORY — DX: Frequency of micturition: R35.0

## 2015-06-07 HISTORY — DX: Adverse effect of unspecified anesthetic, initial encounter: T41.45XA

## 2015-06-07 HISTORY — DX: Other complications of anesthesia, initial encounter: T88.59XA

## 2015-06-07 LAB — URINALYSIS, ROUTINE W REFLEX MICROSCOPIC
Bilirubin Urine: NEGATIVE
Glucose, UA: NEGATIVE mg/dL
Hgb urine dipstick: NEGATIVE
Ketones, ur: NEGATIVE mg/dL
Leukocytes, UA: NEGATIVE
Nitrite: NEGATIVE
Protein, ur: NEGATIVE mg/dL
Specific Gravity, Urine: 1.018 (ref 1.005–1.030)
Urobilinogen, UA: 0.2 mg/dL (ref 0.0–1.0)
pH: 5.5 (ref 5.0–8.0)

## 2015-06-07 LAB — SURGICAL PCR SCREEN
MRSA, PCR: NEGATIVE
Staphylococcus aureus: NEGATIVE

## 2015-06-07 LAB — CBC WITH DIFFERENTIAL/PLATELET
Basophils Absolute: 0 10*3/uL (ref 0.0–0.1)
Basophils Relative: 1 %
Eosinophils Absolute: 0.1 10*3/uL (ref 0.0–0.7)
Eosinophils Relative: 2 %
HCT: 42.6 % (ref 39.0–52.0)
Hemoglobin: 14.2 g/dL (ref 13.0–17.0)
Lymphocytes Relative: 23 %
Lymphs Abs: 1.1 10*3/uL (ref 0.7–4.0)
MCH: 29.2 pg (ref 26.0–34.0)
MCHC: 33.3 g/dL (ref 30.0–36.0)
MCV: 87.7 fL (ref 78.0–100.0)
Monocytes Absolute: 0.5 10*3/uL (ref 0.1–1.0)
Monocytes Relative: 10 %
Neutro Abs: 3 10*3/uL (ref 1.7–7.7)
Neutrophils Relative %: 64 %
Platelets: 160 10*3/uL (ref 150–400)
RBC: 4.86 MIL/uL (ref 4.22–5.81)
RDW: 12.6 % (ref 11.5–15.5)
WBC: 4.7 10*3/uL (ref 4.0–10.5)

## 2015-06-07 LAB — BASIC METABOLIC PANEL
Anion gap: 7 (ref 5–15)
BUN: 17 mg/dL (ref 6–20)
CO2: 24 mmol/L (ref 22–32)
Calcium: 9.4 mg/dL (ref 8.9–10.3)
Chloride: 108 mmol/L (ref 101–111)
Creatinine, Ser: 0.91 mg/dL (ref 0.61–1.24)
GFR calc Af Amer: >60 mL/min (ref >60)
GFR calc non Af Amer: >60 mL/min (ref >60)
Glucose, Bld: 93 mg/dL (ref 65–99)
Potassium: 4 mmol/L (ref 3.5–5.1)
Sodium: 139 mmol/L (ref 135–145)

## 2015-06-07 LAB — TYPE AND SCREEN
ABO/RH(D): O POS
Antibody Screen: NEGATIVE

## 2015-06-07 LAB — PROTIME-INR
INR: 1.1 (ref 0.00–1.49)
Prothrombin Time: 14.3 seconds (ref 11.6–15.2)

## 2015-06-07 LAB — APTT: aPTT: 30 seconds (ref 24–37)

## 2015-06-07 NOTE — Progress Notes (Signed)
PCP - Dr. Kenton Kingfisher at [facility] - denies  EKG - 06/07/15 CXR - 06/07/15  Echo- 2011 - Epic Stress Test - requested Cardiac Cath - denies  Patient denies shortness of breath and chest pain at PAT appointment.

## 2015-06-14 NOTE — H&P (Signed)
TOTAL KNEE ADMISSION H&P  Patient is being admitted for left total knee arthroplasty.  Subjective:  Chief Complaint:left knee pain.  HPI: Gerald Spears, 74 y.o. male, has a history of pain and functional disability in the left knee due to arthritis and has failed non-surgical conservative treatments for greater than 12 weeks to includeNSAID's and/or analgesics, corticosteriod injections, viscosupplementation injections, flexibility and strengthening excercises, weight reduction as appropriate and activity modification.  Onset of symptoms was gradual, starting 3 years ago with gradually worsening course since that time. The patient noted no past surgery on the left knee(s).  Patient currently rates pain in the left knee(s) at 10 out of 10 with activity. Patient has night pain, worsening of pain with activity and weight bearing, pain that interferes with activities of daily living, pain with passive range of motion, crepitus and joint swelling.  Patient has evidence of subchondral sclerosis and joint space narrowing by imaging studies. There is no active infection.  Patient Active Problem List   Diagnosis Date Noted  . Osteomyelitis of right knee region [facility]) 03/12/2011   Past Medical History  Diagnosis Date  . UTI (lower urinary tract infection)   . DJD (degenerative joint disease)     Dr. Noemi Chapel end stages of DJD 2011  . Cancer [facility])     colon polyp  . Complication of anesthesia     difficult to wake up after surgery  . Hard of hearing   . Urinary frequency     Past Surgical History  Procedure Laterality Date  . Joint replacement    . Hernia repair    . Ankle surgery Right   . Elbow surgery Right   . Total knee arthroplasty Right   . Knee surgery Right     x4  . Eye surgery    . Colonoscopy w/ biopsies and polypectomy      No prescriptions prior to admission   No Known Allergies  Social History  Substance Use Topics  . Smoking status: Never Smoker   . Smokeless  tobacco: Never Used  . Alcohol Use: No    Family History  Problem Relation Age of Onset  . Heart disease Father   . Heart attack Mother     age 39  . Breast cancer Sister   . Breast cancer Sister      Review of Systems  Constitutional: Negative.   HENT: Positive for hearing loss.   Eyes:       Glasses  Respiratory: Negative.   Cardiovascular: Negative.   Gastrointestinal: Negative.   Genitourinary: Negative.   Musculoskeletal: Positive for joint pain.  Skin: Negative.   Neurological: Negative.   Endo/Heme/Allergies: Negative.   Psychiatric/Behavioral: Negative.     Objective:  Physical Exam  Constitutional: He appears well-developed and well-nourished.  HENT:  Head: Normocephalic and atraumatic.  Eyes: Pupils are equal, round, and reactive to light.  Neck: Normal range of motion. Neck supple.  Cardiovascular: Intact distal pulses.   Respiratory: Effort normal.  Musculoskeletal: He exhibits tenderness.  Left knee range of motion is from 10-120, collateral ligaments are stable, 1+ effusion.  Tender along the medial joint line.  Obvious varus deformity.  His skin is intact his neurovascular intact.  The revision right total knee range of motion is from 5-90.  Skin: Skin is warm and dry.  Psychiatric: He has a normal mood and affect. His behavior is normal. Judgment and thought content normal.    Vital signs in last 24 hours:  Labs:   Estimated body mass index is 25.17 kg/(m^2) as calculated from the following:   Height as of 03/29/15: 5' 11.5" (1.816 m).   Weight as of 03/29/15: 83.008 kg (183 lb).   Imaging Review Plain radiographs demonstrate bone-on-bone arthritis to the medial compartment.  Assessment/Plan:  End stage arthritis, left knee   The patient history, physical examination, clinical judgment of the provider and imaging studies are consistent with end stage degenerative joint disease of the left knee(s) and total knee arthroplasty is deemed  medically necessary. The treatment options including medical management, injection therapy arthroscopy and arthroplasty were discussed at length. The risks and benefits of total knee arthroplasty were presented and reviewed. The risks due to aseptic loosening, infection, stiffness, patella tracking problems, thromboembolic complications and other imponderables were discussed. The patient acknowledged the explanation, agreed to proceed with the plan and consent was signed. Patient is being admitted for inpatient treatment for surgery, pain control, PT, OT, prophylactic antibiotics, VTE prophylaxis, progressive ambulation and ADL's and discharge planning. The patient is planning to be discharged home with home health services

## 2015-06-17 MED ORDER — CEFAZOLIN SODIUM-DEXTROSE 2-3 GM-% IV SOLR
2.0000 g | INTRAVENOUS | Status: AC
  Administered 2015-06-18: 2 g via INTRAVENOUS
  Filled 2015-06-17: qty 50

## 2015-06-17 NOTE — Anesthesia Preprocedure Evaluation (Addendum)
Anesthesia Evaluation  Patient identified by MRN, date of birth, ID band Patient awake    Reviewed: Allergy & Precautions, NPO status   History of Anesthesia Complications (+) PROLONGED EMERGENCE and history of anesthetic complications  Airway Mallampati: II  TM Distance: >3 FB Neck ROM: Full    Dental  (+) Upper Dentures, Dental Advisory Given   Pulmonary neg pulmonary ROS,    Pulmonary exam normal        Cardiovascular negative cardio ROS Normal cardiovascular exam     Neuro/Psych negative neurological ROS  negative psych ROS   GI/Hepatic negative GI ROS, Neg liver ROS,   Endo/Other  negative endocrine ROS  Renal/GU negative Renal ROS     Musculoskeletal  (+) Arthritis ,   Abdominal   Peds  Hematology   Anesthesia Other Findings   Reproductive/Obstetrics                            Anesthesia Physical Anesthesia Plan  ASA: II  Anesthesia Plan: MAC and Spinal   Post-op Pain Management:    Induction:   Airway Management Planned: Simple Face Mask  Additional Equipment:   Intra-op Plan:   Post-operative Plan:   Informed Consent: I have reviewed the patients History and Physical, chart, labs and discussed the procedure including the risks, benefits and alternatives for the proposed anesthesia with the patient or authorized representative who has indicated his/her understanding and acceptance.   Dental advisory given  Plan Discussed with:   Anesthesia Plan Comments:        Anesthesia Quick Evaluation

## 2015-06-18 ENCOUNTER — Encounter (HOSPITAL_COMMUNITY): Payer: Self-pay | Admitting: Certified Registered Nurse Anesthetist

## 2015-06-18 ENCOUNTER — Inpatient Hospital Stay (HOSPITAL_COMMUNITY): Payer: Medicare Other | Admitting: Anesthesiology

## 2015-06-18 ENCOUNTER — Encounter (HOSPITAL_COMMUNITY)
Admission: RE | Disposition: A | Discharge status: Home Health | Payer: Self-pay | Source: Ambulatory Visit | Attending: Orthopedic Surgery

## 2015-06-18 ENCOUNTER — Inpatient Hospital Stay (HOSPITAL_COMMUNITY)
Admission: RE | Admit: 2015-06-18 | Discharge: 2015-06-19 | DRG: 470 | Disposition: A | Discharge status: Home Health | Payer: Medicare Other | Source: Ambulatory Visit | Attending: Orthopedic Surgery | Admitting: Orthopedic Surgery

## 2015-06-18 DIAGNOSIS — Z96651 Presence of right artificial knee joint: Secondary | ICD-10-CM | POA: Diagnosis present

## 2015-06-18 DIAGNOSIS — M21162 Varus deformity, not elsewhere classified, left knee: Secondary | ICD-10-CM | POA: Diagnosis present

## 2015-06-18 DIAGNOSIS — Z8249 Family history of ischemic heart disease and other diseases of the circulatory system: Secondary | ICD-10-CM

## 2015-06-18 DIAGNOSIS — M179 Osteoarthritis of knee, unspecified: Secondary | ICD-10-CM | POA: Diagnosis not present

## 2015-06-18 DIAGNOSIS — H9190 Unspecified hearing loss, unspecified ear: Secondary | ICD-10-CM | POA: Diagnosis present

## 2015-06-18 DIAGNOSIS — M25562 Pain in left knee: Secondary | ICD-10-CM | POA: Diagnosis not present

## 2015-06-18 DIAGNOSIS — M1710 Unilateral primary osteoarthritis, unspecified knee: Secondary | ICD-10-CM | POA: Diagnosis present

## 2015-06-18 DIAGNOSIS — D62 Acute posthemorrhagic anemia: Secondary | ICD-10-CM | POA: Diagnosis not present

## 2015-06-18 DIAGNOSIS — M1712 Unilateral primary osteoarthritis, left knee: Principal | ICD-10-CM | POA: Diagnosis present

## 2015-06-18 HISTORY — PX: TOTAL KNEE ARTHROPLASTY: SHX125

## 2015-06-18 SURGERY — ARTHROPLASTY, KNEE, TOTAL
Anesthesia: Monitor Anesthesia Care | Site: Knee | Laterality: Left

## 2015-06-18 MED ORDER — TAMSULOSIN HCL 0.4 MG PO CAPS
0.4000 mg | ORAL_CAPSULE | Freq: Every day | ORAL | Status: DC
  Administered 2015-06-19: 0.4 mg via ORAL
  Filled 2015-06-18: qty 1

## 2015-06-18 MED ORDER — PHENOL 1.4 % MT LIQD: 1.0000 | OROMUCOSAL | Status: DC | PRN

## 2015-06-18 MED ORDER — KCL IN DEXTROSE-NACL 20-5-0.45 MEQ/L-%-% IV SOLN
INTRAVENOUS | Status: AC
  Filled 2015-06-18: qty 1000

## 2015-06-18 MED ORDER — BUPIVACAINE LIPOSOME 1.3 % IJ SUSP
20.0000 mL | Freq: Once | INTRAMUSCULAR | Status: AC
  Administered 2015-06-18: 20 mL
  Filled 2015-06-18: qty 20

## 2015-06-18 MED ORDER — SUCCINYLCHOLINE CHLORIDE 20 MG/ML IJ SOLN
INTRAMUSCULAR | Status: AC
  Filled 2015-06-18: qty 1

## 2015-06-18 MED ORDER — ONDANSETRON HCL 4 MG PO TABS: 4.0000 mg | ORAL_TABLET | Freq: Four times a day (QID) | ORAL | Status: DC | PRN

## 2015-06-18 MED ORDER — CEFUROXIME SODIUM 1.5 G IJ SOLR
INTRAMUSCULAR | Status: AC
  Filled 2015-06-18: qty 1.5

## 2015-06-18 MED ORDER — METHOCARBAMOL 500 MG PO TABS
500.0000 mg | ORAL_TABLET | Freq: Four times a day (QID) | ORAL | Status: DC | PRN
  Administered 2015-06-18 – 2015-06-19 (×2): 500 mg via ORAL
  Filled 2015-06-18 (×2): qty 1

## 2015-06-18 MED ORDER — FENTANYL CITRATE (PF) 100 MCG/2ML IJ SOLN
INTRAMUSCULAR | Status: DC | PRN
  Administered 2015-06-18: 50 ug via INTRAVENOUS

## 2015-06-18 MED ORDER — ONDANSETRON HCL 4 MG/2ML IJ SOLN
INTRAMUSCULAR | Status: DC | PRN
  Administered 2015-06-18: 4 mg via INTRAVENOUS

## 2015-06-18 MED ORDER — DOXYCYCLINE HYCLATE 100 MG PO TABS
100.0000 mg | ORAL_TABLET | Freq: Two times a day (BID) | ORAL | Status: DC
  Administered 2015-06-18 – 2015-06-19 (×2): 100 mg via ORAL
  Filled 2015-06-18 (×2): qty 1

## 2015-06-18 MED ORDER — PROMETHAZINE HCL 25 MG/ML IJ SOLN: 6.2500 mg | INTRAMUSCULAR | Status: DC | PRN

## 2015-06-18 MED ORDER — VALACYCLOVIR HCL 500 MG PO TABS: 500.0000 mg | ORAL_TABLET | Freq: Two times a day (BID) | ORAL | Status: DC | PRN

## 2015-06-18 MED ORDER — OXYCODONE HCL 5 MG PO TABS
5.0000 mg | ORAL_TABLET | ORAL | Status: DC | PRN
  Administered 2015-06-18 – 2015-06-19 (×4): 10 mg via ORAL
  Filled 2015-06-18 (×4): qty 2

## 2015-06-18 MED ORDER — DEXTROSE-NACL 5-0.45 % IV SOLN: INTRAVENOUS | Status: DC

## 2015-06-18 MED ORDER — TRANEXAMIC ACID 1000 MG/10ML IV SOLN
1000.0000 mg | INTRAVENOUS | Status: DC
  Filled 2015-06-18: qty 10

## 2015-06-18 MED ORDER — ACETAMINOPHEN 650 MG RE SUPP: 650.0000 mg | Freq: Four times a day (QID) | RECTAL | Status: DC | PRN

## 2015-06-18 MED ORDER — PROPOFOL 10 MG/ML IV BOLUS
INTRAVENOUS | Status: AC
  Filled 2015-06-18: qty 20

## 2015-06-18 MED ORDER — SODIUM CHLORIDE 0.9 % IJ SOLN
INTRAMUSCULAR | Status: DC | PRN
  Administered 2015-06-18: 40 mL via INTRAVENOUS

## 2015-06-18 MED ORDER — ONDANSETRON HCL 4 MG/2ML IJ SOLN
INTRAMUSCULAR | Status: AC
  Filled 2015-06-18: qty 2

## 2015-06-18 MED ORDER — FLEET ENEMA 7-19 GM/118ML RE ENEM: 1.0000 | ENEMA | Freq: Once | RECTAL | Status: DC | PRN

## 2015-06-18 MED ORDER — SODIUM CHLORIDE 0.9 % IR SOLN
Status: DC | PRN
  Administered 2015-06-18: 3000 mL

## 2015-06-18 MED ORDER — ALUM & MAG HYDROXIDE-SIMETH 200-200-20 MG/5ML PO SUSP: 30.0000 mL | ORAL | Status: DC | PRN

## 2015-06-18 MED ORDER — TRANEXAMIC ACID 1000 MG/10ML IV SOLN
1000.0000 mg | INTRAVENOUS | Status: DC
  Administered 2015-06-18: 1000 mg via INTRAVENOUS
  Filled 2015-06-18: qty 10

## 2015-06-18 MED ORDER — CEFUROXIME SODIUM 1.5 G IJ SOLR
INTRAMUSCULAR | Status: DC | PRN
  Administered 2015-06-18: 1.5 g

## 2015-06-18 MED ORDER — PROPOFOL 500 MG/50ML IV EMUL
INTRAVENOUS | Status: DC | PRN
  Administered 2015-06-18: 60 ug/kg/min via INTRAVENOUS

## 2015-06-18 MED ORDER — SODIUM CHLORIDE 0.9 % IJ SOLN
INTRAMUSCULAR | Status: AC
  Filled 2015-06-18: qty 10

## 2015-06-18 MED ORDER — METOCLOPRAMIDE HCL 5 MG/ML IJ SOLN: 5.0000 mg | Freq: Three times a day (TID) | INTRAMUSCULAR | Status: DC | PRN

## 2015-06-18 MED ORDER — BUPIVACAINE IN DEXTROSE 0.75-8.25 % IT SOLN
INTRATHECAL | Status: DC | PRN
  Administered 2015-06-18: 15 mg via INTRATHECAL

## 2015-06-18 MED ORDER — EPHEDRINE SULFATE 50 MG/ML IJ SOLN
INTRAMUSCULAR | Status: AC
  Filled 2015-06-18: qty 1

## 2015-06-18 MED ORDER — MENTHOL 3 MG MT LOZG: 1.0000 | LOZENGE | OROMUCOSAL | Status: DC | PRN

## 2015-06-18 MED ORDER — ROCURONIUM BROMIDE 50 MG/5ML IV SOLN
INTRAVENOUS | Status: AC
  Filled 2015-06-18: qty 1

## 2015-06-18 MED ORDER — METOCLOPRAMIDE HCL 5 MG PO TABS: 5.0000 mg | ORAL_TABLET | Freq: Three times a day (TID) | ORAL | Status: DC | PRN

## 2015-06-18 MED ORDER — LIDOCAINE HCL (CARDIAC) 20 MG/ML IV SOLN
INTRAVENOUS | Status: AC
  Filled 2015-06-18: qty 5

## 2015-06-18 MED ORDER — ACETAMINOPHEN 325 MG PO TABS: 650.0000 mg | ORAL_TABLET | Freq: Four times a day (QID) | ORAL | Status: DC | PRN

## 2015-06-18 MED ORDER — PHENYLEPHRINE 40 MCG/ML (10ML) SYRINGE FOR IV PUSH (FOR BLOOD PRESSURE SUPPORT)
PREFILLED_SYRINGE | INTRAVENOUS | Status: AC
  Filled 2015-06-18: qty 10

## 2015-06-18 MED ORDER — FENTANYL CITRATE (PF) 250 MCG/5ML IJ SOLN
INTRAMUSCULAR | Status: AC
  Filled 2015-06-18: qty 5

## 2015-06-18 MED ORDER — HYDROMORPHONE HCL 1 MG/ML IJ SOLN
0.5000 mg | INTRAMUSCULAR | Status: DC | PRN
  Administered 2015-06-18 – 2015-06-19 (×7): 1 mg via INTRAVENOUS
  Filled 2015-06-18 (×7): qty 1

## 2015-06-18 MED ORDER — ONDANSETRON HCL 4 MG/2ML IJ SOLN
4.0000 mg | Freq: Four times a day (QID) | INTRAMUSCULAR | Status: DC | PRN
  Administered 2015-06-18: 4 mg via INTRAVENOUS
  Filled 2015-06-18: qty 2

## 2015-06-18 MED ORDER — CHLORHEXIDINE GLUCONATE 4 % EX LIQD: 60.0000 mL | Freq: Once | CUTANEOUS | Status: DC

## 2015-06-18 MED ORDER — BISACODYL 5 MG PO TBEC: 5.0000 mg | DELAYED_RELEASE_TABLET | Freq: Every day | ORAL | Status: DC | PRN

## 2015-06-18 MED ORDER — MIDAZOLAM HCL 2 MG/2ML IJ SOLN
INTRAMUSCULAR | Status: AC
  Filled 2015-06-18: qty 4

## 2015-06-18 MED ORDER — LACTATED RINGERS IV SOLN
INTRAVENOUS | Status: DC | PRN
  Administered 2015-06-18 (×2): via INTRAVENOUS

## 2015-06-18 MED ORDER — DIPHENHYDRAMINE HCL 12.5 MG/5ML PO ELIX: 12.5000 mg | ORAL_SOLUTION | ORAL | Status: DC | PRN

## 2015-06-18 MED ORDER — HYDROMORPHONE HCL 1 MG/ML IJ SOLN: 0.2500 mg | INTRAMUSCULAR | Status: DC | PRN

## 2015-06-18 MED ORDER — MIDAZOLAM HCL 5 MG/5ML IJ SOLN
INTRAMUSCULAR | Status: DC | PRN
  Administered 2015-06-18 (×2): 1 mg via INTRAVENOUS

## 2015-06-18 MED ORDER — DOCUSATE SODIUM 100 MG PO CAPS
100.0000 mg | ORAL_CAPSULE | Freq: Two times a day (BID) | ORAL | Status: DC
  Administered 2015-06-18 – 2015-06-19 (×2): 100 mg via ORAL
  Filled 2015-06-18 (×2): qty 1

## 2015-06-18 MED ORDER — PHENYLEPHRINE HCL 10 MG/ML IJ SOLN
INTRAMUSCULAR | Status: DC | PRN
  Administered 2015-06-18 (×3): 80 ug via INTRAVENOUS
  Administered 2015-06-18: 120 ug via INTRAVENOUS

## 2015-06-18 MED ORDER — SENNOSIDES-DOCUSATE SODIUM 8.6-50 MG PO TABS: 1.0000 | ORAL_TABLET | Freq: Every evening | ORAL | Status: DC | PRN

## 2015-06-18 MED ORDER — ASPIRIN EC 325 MG PO TBEC
325.0000 mg | DELAYED_RELEASE_TABLET | Freq: Every day | ORAL | Status: DC
  Administered 2015-06-19: 325 mg via ORAL
  Filled 2015-06-18: qty 1

## 2015-06-18 MED ORDER — KCL IN DEXTROSE-NACL 20-5-0.45 MEQ/L-%-% IV SOLN
INTRAVENOUS | Status: DC
  Administered 2015-06-18: 125 mL/h via INTRAVENOUS
  Administered 2015-06-19: 02:00:00 via INTRAVENOUS
  Filled 2015-06-18 (×3): qty 1000

## 2015-06-18 MED ORDER — METHOCARBAMOL 1000 MG/10ML IJ SOLN
500.0000 mg | Freq: Four times a day (QID) | INTRAVENOUS | Status: DC | PRN
  Filled 2015-06-18: qty 5

## 2015-06-18 SURGICAL SUPPLY — 60 items
BANDAGE ELASTIC 6 VELCRO ST LF (GAUZE/BANDAGES/DRESSINGS) ×1 IMPLANT
BANDAGE ESMARK 6X9 LF (GAUZE/BANDAGES/DRESSINGS) ×1 IMPLANT
BLADE SAG 18X100X1.27 (BLADE) ×2 IMPLANT
BLADE SAW SGTL 13X75X1.27 (BLADE) ×2 IMPLANT
BLADE SURG ROTATE 9660 (MISCELLANEOUS) IMPLANT
BNDG CMPR 9X6 STRL LF SNTH (GAUZE/BANDAGES/DRESSINGS) ×1
BNDG CMPR MED 10X6 ELC LF (GAUZE/BANDAGES/DRESSINGS) ×1
BNDG ELASTIC 6X10 VLCR STRL LF (GAUZE/BANDAGES/DRESSINGS) ×2 IMPLANT
BNDG ESMARK 6X9 LF (GAUZE/BANDAGES/DRESSINGS) ×2
BOWL SMART MIX CTS (DISPOSABLE) ×2 IMPLANT
CAPT KNEE TOTAL 3 ATTUNE ×1 IMPLANT
CEMENT HV SMART SET (Cement) ×4 IMPLANT
COVER SURGICAL LIGHT HANDLE (MISCELLANEOUS) ×2 IMPLANT
CUFF TOURNIQUET SINGLE 34IN LL (TOURNIQUET CUFF) ×2 IMPLANT
CUFF TOURNIQUET SINGLE 44IN (TOURNIQUET CUFF) IMPLANT
DRAPE EXTREMITY T 121X128X90 (DRAPE) ×2 IMPLANT
DRAPE U-SHAPE 47X51 STRL (DRAPES) ×2 IMPLANT
DURAPREP 26ML APPLICATOR (WOUND CARE) ×4 IMPLANT
ELECT REM PT RETURN 9FT ADLT (ELECTROSURGICAL) ×2
ELECTRODE REM PT RTRN 9FT ADLT (ELECTROSURGICAL) ×1 IMPLANT
EVACUATOR 1/8 PVC DRAIN (DRAIN) IMPLANT
GAUZE SPONGE 4X4 12PLY STRL (GAUZE/BANDAGES/DRESSINGS) ×3 IMPLANT
GAUZE XEROFORM 1X8 LF (GAUZE/BANDAGES/DRESSINGS) ×2 IMPLANT
GLOVE BIO SURGEON STRL SZ7.5 (GLOVE) ×2 IMPLANT
GLOVE BIO SURGEON STRL SZ8.5 (GLOVE) ×2 IMPLANT
GLOVE BIOGEL PI IND STRL 8 (GLOVE) ×1 IMPLANT
GLOVE BIOGEL PI IND STRL 9 (GLOVE) ×1 IMPLANT
GLOVE BIOGEL PI INDICATOR 8 (GLOVE) ×1
GLOVE BIOGEL PI INDICATOR 9 (GLOVE) ×1
GOWN STRL REUS W/ TWL LRG LVL3 (GOWN DISPOSABLE) ×1 IMPLANT
GOWN STRL REUS W/ TWL XL LVL3 (GOWN DISPOSABLE) ×2 IMPLANT
GOWN STRL REUS W/TWL LRG LVL3 (GOWN DISPOSABLE) ×2
GOWN STRL REUS W/TWL XL LVL3 (GOWN DISPOSABLE) ×4
HANDPIECE INTERPULSE COAX TIP (DISPOSABLE) ×2
HOOD PEEL AWAY FACE SHEILD DIS (HOOD) ×4 IMPLANT
KIT BASIN OR (CUSTOM PROCEDURE TRAY) ×2 IMPLANT
KIT ROOM TURNOVER OR (KITS) ×2 IMPLANT
MANIFOLD NEPTUNE II (INSTRUMENTS) ×2 IMPLANT
NDL SPNL 18GX3.5 QUINCKE PK (NEEDLE) IMPLANT
NEEDLE SPNL 18GX3.5 QUINCKE PK (NEEDLE) ×2 IMPLANT
NS IRRIG 1000ML POUR BTL (IV SOLUTION) ×2 IMPLANT
PACK TOTAL JOINT (CUSTOM PROCEDURE TRAY) ×2 IMPLANT
PACK UNIVERSAL I (CUSTOM PROCEDURE TRAY) ×2 IMPLANT
PAD ARMBOARD 7.5X6 YLW CONV (MISCELLANEOUS) ×4 IMPLANT
PADDING CAST COTTON 6X4 STRL (CAST SUPPLIES) ×2 IMPLANT
SET HNDPC FAN SPRY TIP SCT (DISPOSABLE) ×1 IMPLANT
SUT VIC AB 0 CT1 27 (SUTURE) ×2
SUT VIC AB 0 CT1 27XBRD ANBCTR (SUTURE) ×1 IMPLANT
SUT VIC AB 1 CTX 36 (SUTURE) ×2
SUT VIC AB 1 CTX36XBRD ANBCTR (SUTURE) ×1 IMPLANT
SUT VIC AB 2-0 CT1 27 (SUTURE) ×2
SUT VIC AB 2-0 CT1 TAPERPNT 27 (SUTURE) ×1 IMPLANT
SUT VIC AB 3-0 CT1 27 (SUTURE) ×2
SUT VIC AB 3-0 CT1 TAPERPNT 27 (SUTURE) ×1 IMPLANT
SUT VIC AB 3-0 FS2 27 (SUTURE) ×2 IMPLANT
SYR 50ML LL SCALE MARK (SYRINGE) ×2 IMPLANT
TOWEL OR 17X24 6PK STRL BLUE (TOWEL DISPOSABLE) ×2 IMPLANT
TOWEL OR 17X26 10 PK STRL BLUE (TOWEL DISPOSABLE) ×2 IMPLANT
TRAY CATH 16FR W/PLASTIC CATH (SET/KITS/TRAYS/PACK) IMPLANT
WATER STERILE IRR 1000ML POUR (IV SOLUTION) ×6 IMPLANT

## 2015-06-18 NOTE — Discharge Instructions (Signed)

## 2015-06-18 NOTE — Anesthesia Procedure Notes (Addendum)
Spinal Patient location during procedure: OR Start time: 06/18/2015 7:22 AM End time: 06/18/2015 7:32 AM Staffing Anesthesiologist: Duane Boston Performed by: anesthesiologist  Preanesthetic Checklist Completed: patient identified, surgical consent, pre-op evaluation, timeout performed, IV checked, risks and benefits discussed and monitors and equipment checked Spinal Block Patient position: sitting Prep: DuraPrep Patient monitoring: cardiac monitor, continuous pulse ox and blood pressure Approach: midline Location: L3-4 Injection technique: single-shot Needle Needle type: Pencan  Needle gauge: 24 G Needle length: 9 cm Additional Notes Functioning IV was confirmed and monitors were applied. Sterile prep and drape, including hand hygiene and sterile gloves were used. The patient was positioned and the spine was prepped. The skin was anesthetized with lidocaine.  Free flow of clear CSF was obtained prior to injecting local anesthetic into the CSF.  The spinal needle aspirated freely following injection.  The needle was carefully withdrawn.  The patient tolerated the procedure well.   Procedure Name: MAC Date/Time: 06/18/2015 7:28 AM Performed by: Salli Quarry Loys Shugars Pre-anesthesia Checklist: Patient identified, Emergency Drugs available, Suction available and Patient being monitored Patient Re-evaluated:Patient Re-evaluated prior to inductionOxygen Delivery Method: Nasal cannula Intubation Type: IV induction

## 2015-06-18 NOTE — Transfer of Care (Signed)
Immediate Anesthesia Transfer of Care Note  Patient: Gerald Spears  Procedure(s) Performed: Procedure(s): TOTAL KNEE ARTHROPLASTY (Left)  Patient Location: PACU  Anesthesia Type:MAC and Spinal  Level of Consciousness: awake, alert , oriented and patient cooperative  Airway & Oxygen Therapy: Patient Spontanous Breathing and Patient connected to nasal cannula oxygen  Post-op Assessment: Report given to RN and Post -op Vital signs reviewed and stable  Post vital signs: Reviewed and stable  Last Vitals:  Filed Vitals:   06/18/15 0604  BP:   Pulse:   Temp:   Resp: 16    Complications: No apparent anesthesia complications

## 2015-06-18 NOTE — Interval H&P Note (Signed)
History and Physical Interval Note:  06/18/2015 7:14 AM  Gerald Spears  has presented today for surgery, with the diagnosis of OSTEOARTHRITIS LEFT KNEE  The various methods of treatment have been discussed with the patient and family. After consideration of risks, benefits and other options for treatment, the patient has consented to  Procedure(s): TOTAL KNEE ARTHROPLASTY (Left) as a surgical intervention .  The patient's history has been reviewed, patient examined, no change in status, stable for surgery.  I have reviewed the patient's chart and labs.  Questions were answered to the patient's satisfaction.     [name]

## 2015-06-18 NOTE — Progress Notes (Signed)
Orthopedic Tech Progress Note Patient Details:  Gerald Spears 1940-10-15 [id number]  CPM Left Knee CPM Left Knee: On Left Knee Flexion (Degrees): 40 Left Knee Extension (Degrees): 10 Additional Comments: Trapeze bar and foot roll   [place] Pink 06/18/2015, 10:37 AM

## 2015-06-18 NOTE — Evaluation (Signed)
Physical Therapy Evaluation Patient Details Name: Gerald Spears MRN: [id number] DOB: 04/22/1941 Today's Date: 06/18/2015   History of Present Illness  74 y.o. male s/p Lt TKA. PMH: Rt TKA  Clinical Impression  Pt is s/p TKA resulting in the deficits listed below (see PT Problem List). Pt will benefit from skilled PT to increase their independence and safety with mobility to allow discharge to home with family assistance. Patient able to stand and ambulate 4 feet to chair during initial session. Will progress mobility as tolerated to increase independence.      Follow Up Recommendations Home health PT;Supervision for mobility/OOB    Equipment Recommendations  Rolling walker with 5" wheels    Recommendations for Other Services       Precautions / Restrictions Precautions Precautions: Knee;Fall Precaution Booklet Issued: Yes (comment) Precaution Comments: HEP provided, reviewed no pillow under knee Restrictions Weight Bearing Restrictions: Yes LLE Weight Bearing: Weight bearing as tolerated      Mobility  Bed Mobility Overal bed mobility: Needs Assistance Bed Mobility: Supine to Sit     Supine to sit: Min guard;HOB elevated     General bed mobility comments: using rail to assist  Transfers Overall transfer level: Needs assistance Equipment used: Rolling walker (2 wheeled) Transfers: Sit to/from Stand Sit to Stand: Mod assist         General transfer comment: difficulty flexing Rt knee back for push assist to standing. Cues for hand placement  Ambulation/Gait Ambulation/Gait assistance: Min guard Ambulation Distance (Feet): 4 Feet Assistive device: Rolling walker (2 wheeled) Gait Pattern/deviations: Step-to pattern;Decreased step length - right;Decreased step length - left;Decreased stance time - left;Decreased weight shift to left Gait velocity: decreased   General Gait Details: slow and guarded pattern.   Stairs            Wheelchair Mobility     Modified Rankin (Stroke Patients Only)       Balance Overall balance assessment: Needs assistance Sitting-balance support: No upper extremity supported Sitting balance-Leahy Scale: Good     Standing balance support: Bilateral upper extremity supported Standing balance-Leahy Scale: Poor Standing balance comment: using rw                             Pertinent Vitals/Pain Pain Assessment: 0-10 Pain Score: 7  Pain Location: Lt knee Pain Descriptors / Indicators: Sharp;Aching Pain Intervention(s): Limited activity within patient's tolerance;Monitored during session;RN gave pain meds during session    Addis expects to be discharged to:: Private residence Living Arrangements: Spouse/significant other Available Help at Discharge: Family;Available 24 hours/day Type of Home: House Home Access: Stairs to enter Entrance Stairs-Rails: None Entrance Stairs-Number of Steps: 2 Home Layout: One level Home Equipment: Cane - single point      Prior Function Level of Independence: Independent with assistive device(s) (occasional use of cane)               Hand Dominance        Extremity/Trunk Assessment               Lower Extremity Assessment: LLE deficits/detail   LLE Deficits / Details: able to perform SLR with lag     Communication   Communication: No difficulties  Cognition Arousal/Alertness: Awake/alert Behavior During Therapy: WFL for tasks assessed/performed Overall Cognitive Status: Within Functional Limits for tasks assessed  General Comments      Exercises        Assessment/Plan    PT Assessment Patient needs continued PT services  PT Diagnosis Difficulty walking;Generalized weakness;Acute pain   PT Problem List Decreased strength;Decreased range of motion;Decreased activity tolerance;Decreased balance;Decreased mobility;Decreased knowledge of use of DME;Pain  PT Treatment  Interventions DME instruction;Gait training;Stair training;Functional mobility training;Therapeutic activities;Therapeutic exercise;Balance training;Patient/family education   PT Goals (Current goals can be found in the Care Plan section) Acute Rehab PT Goals Patient Stated Goal: go home PT Goal Formulation: With patient Time For Goal Achievement: 07/02/15 Potential to Achieve Goals: Good    Frequency 7X/week   Barriers to discharge        Co-evaluation               End of Session Equipment Utilized During Treatment: Gait belt Activity Tolerance: Patient tolerated treatment well Patient left: in chair;with call bell/phone within reach;with family/visitor present;Other (comment) (in bone foam) Nurse Communication: Mobility status;Weight bearing status;Patient requests pain meds         Time: 1400-1434 PT Time Calculation (min) (ACUTE ONLY): 34 min   Charges:   PT Evaluation $Initial PT Evaluation Tier I: 1 Procedure PT Treatments $Therapeutic Activity: 8-22 mins   PT G Codes:        Cassell Clement, PT, CSCS Pager [phone] Office [phone]  06/18/2015, 2:46 PM

## 2015-06-18 NOTE — Progress Notes (Signed)
Utilization review completed.  

## 2015-06-18 NOTE — Anesthesia Postprocedure Evaluation (Signed)
Anesthesia Post Note  Patient: Gerald Spears  Procedure(s) Performed: Procedure(s) (LRB): TOTAL KNEE ARTHROPLASTY (Left)  Anesthesia type: MAC/SAB  Patient location: PACU  Post pain: Pain level controlled  Post assessment: Patient's Cardiovascular Status Stable, SAB receding  Last Vitals:  Filed Vitals:   06/18/15 1007  BP:   Pulse: 45  Temp:   Resp: 12    Post vital signs: Reviewed and stable  Level of consciousness: sedated  Complications: No apparent anesthesia complications

## 2015-06-18 NOTE — Op Note (Signed)
PATIENT ID:      Gerald Spears  MRN:     [id number] DOB/AGE:    Mar 11, 1941 / 74 y.o.       OPERATIVE REPORT    DATE OF PROCEDURE:  06/18/2015       PREOPERATIVE DIAGNOSIS:   OSTEOARTHRITIS LEFT KNEE      Estimated body mass index is 25.76 kg/(m^2) as calculated from the following:   Height as of 06/07/15: 6' (1.829 m).   Weight as of this encounter: 86.183 kg (190 lb).                                                        POSTOPERATIVE DIAGNOSIS:   OSTEOARTHRITIS LEFT KNEE                                                                      PROCEDURE:  Procedure(s): TOTAL KNEE ARTHROPLASTY Using DepuyAttune RP implants #8L Femur, #9Tibia, 7 mm Attune RP bearing, 41 Patella     SURGEON: Jamiah Homeyer J    ASSISTANT:   [employer]   (Present and scrubbed throughout the case, critical for assistance with exposure, retraction, instrumentation, and closure.)         ANESTHESIA: Spinal, Exparel  EBL: 150  FLUID REPLACEMENT: 1500 crystalloid  TOURNIQUET TIME: 44min  Drains: None  Tranexamic Acid: 1gm IV   COMPLICATIONS:  None         INDICATIONS FOR PROCEDURE: The patient has  OSTEOARTHRITIS LEFT KNEE, Varus deformities, XR shows bone on bone arthritis, lateral subluxation of tibia. Patient has failed all conservative measures including anti-inflammatory medicines, narcotics, attempts at  exercise and weight loss, cortisone injections and viscosupplementation.  Risks and benefits of surgery have been discussed, questions answered.   DESCRIPTION OF PROCEDURE: The patient identified by armband, received  IV antibiotics, in the holding area at [facility]. Patient taken to the operating room, appropriate anesthetic  monitors were attached, andSpinal anesthesia was  induced. Tourniquet  applied high to the operative thigh. Lateral post and foot positioner  applied to the table, the lower extremity was then prepped and draped  in usual sterile fashion from the toes to the  tourniquet. Time-out procedure was performed. We began the operation, with the knee flexed 120 degrees, by making the anterior midline incision starting at handbreadth above the patella going over the patella 1 cm medial to and 4 cm distal to the tibial tubercle. Small bleeders in the skin and the  subcutaneous tissue identified and cauterized. Transverse retinaculum was incised and reflected medially and a medial parapatellar arthrotomy was accomplished. the patella was everted and theprepatellar fat pad resected. The superficial medial collateral  ligament was then elevated from anterior to posterior along the proximal  flare of the tibia and anterior half of the menisci resected. The knee was hyperflexed exposing bone on bone arthritis. Peripheral and notch osteophytes as well as the cruciate ligaments were then resected. We continued to  work our way around posteriorly along the proximal tibia, and externally  rotated the tibia subluxing it  out from underneath the femur. A McHale  retractor was placed through the notch and a lateral Hohmann retractor  placed, and we then drilled through the proximal tibia in line with the  axis of the tibia followed by an intramedullary guide rod and 2-degree  posterior slope cutting guide. The tibial cutting guide, 3 degree posterior sloped, was pinned into place allowing resection of 4 mm of bone medially and about 11 mm of bone laterally. Satisfied with the tibial resection, we then  entered the distal femur 2 mm anterior to the PCL origin with the  intramedullary guide rod and applied the distal femoral cutting guide  set at 81mm, with 5 degrees of valgus. This was pinned along the  epicondylar axis. At this point, the distal femoral cut was accomplished without difficulty. We then sized for a #8L femoral component and pinned the guide in 3 degrees of external rotation.The chamfer cutting guide was pinned into place. The anterior, posterior, and chamfer cuts were  accomplished without difficulty followed by  the Attune RP box cutting guide and the box cut. We also removed posterior osteophytes from the posterior femoral condyles. At this  time, the knee was brought into full extension. We checked our  extension and flexion gaps and found them symmetric for a 7 mm bearing. Distracting in extension with a lamina spreader, the posterior horns of the menisci were removed, and Exparel, diluted to 60 cc, was injected into the capsule of the knee. The  posterior patella cut was accomplished with the 9.5 mm Attune cutting guide, sized at 42* dome, and the fixation pegs drilled.The knee  was then once again hyperflexed exposing the proximal tibia. We sized for a #9 tibial base plate, applied the smokestack and the conical reamer followed by the the Delta fin keel punch. We then hammered into place the Attune RP trial femoral component, inserted a  7 mm trial bearing, trial patellar button, and took the knee through range of motion from 0-130 degrees. No thumb pressure was required for patellar  Tracking. At this point, the limb was wrapped with an Esmarch bandage and the tourniquet inflated to 350 mmHg. All trial components were removed, mating surfaces irrigated with pulse lavage, and dried with suction and sponges. A double batch of DePuy HV cement with 1500 mg of Zinacef was mixed and applied to all bony metallic mating surfaces except for the posterior condyles of the femur itself. In order, we  hammered into place the tibial tray and removed excess cement, the femoral component and removed excess cement,  The final Attune RP bearing  was inserted, and the knee brought to full extension with compression.  The patellar button was clamped into place, and excess cement  removed. While the cement cured the wound was irrigated out with normal saline solution pulse lavage. Ligament stability and patellar tracking were checked and found to be excellent. The parapatellar  arthrotomy was closed with  running #1 Vicryl suture. The subcutaneous tissue with 0 and 2-0 undyed  Vicryl suture, and the skin with running 3-0 SQ vicryl. A dressing of Xeroform,  4 x 4, dressing sponges, Webril, and Ace wrap applied. The patient  awakened, extubated, and taken to recovery room without difficulty.   Tameaka Eichhorn J 06/18/2015, 8:43 AM

## 2015-06-19 ENCOUNTER — Encounter (HOSPITAL_COMMUNITY): Payer: Self-pay | Admitting: Orthopedic Surgery

## 2015-06-19 LAB — CBC
HCT: 35.2 % — ABNORMAL LOW (ref 39.0–52.0)
Hemoglobin: 11.8 g/dL — ABNORMAL LOW (ref 13.0–17.0)
MCH: 29.6 pg (ref 26.0–34.0)
MCHC: 33.5 g/dL (ref 30.0–36.0)
MCV: 88.2 fL (ref 78.0–100.0)
Platelets: 130 10*3/uL — ABNORMAL LOW (ref 150–400)
RBC: 3.99 MIL/uL — ABNORMAL LOW (ref 4.22–5.81)
RDW: 12.6 % (ref 11.5–15.5)
WBC: 8 10*3/uL (ref 4.0–10.5)

## 2015-06-19 LAB — BASIC METABOLIC PANEL
Anion gap: 6 (ref 5–15)
BUN: 14 mg/dL (ref 6–20)
CO2: 27 mmol/L (ref 22–32)
Calcium: 9 mg/dL (ref 8.9–10.3)
Chloride: 101 mmol/L (ref 101–111)
Creatinine, Ser: 0.91 mg/dL (ref 0.61–1.24)
GFR calc Af Amer: >60 mL/min (ref >60)
GFR calc non Af Amer: >60 mL/min (ref >60)
Glucose, Bld: 145 mg/dL — ABNORMAL HIGH (ref 65–99)
Potassium: 5.3 mmol/L — ABNORMAL HIGH (ref 3.5–5.1)
Sodium: 134 mmol/L — ABNORMAL LOW (ref 135–145)

## 2015-06-19 MED ORDER — TIZANIDINE HCL 2 MG PO TABS: 2.0000 mg | ORAL_TABLET | Freq: Four times a day (QID) | ORAL | Status: DC | PRN

## 2015-06-19 MED ORDER — ASPIRIN EC 325 MG PO TBEC: 325.0000 mg | DELAYED_RELEASE_TABLET | Freq: Two times a day (BID) | ORAL | Status: DC

## 2015-06-19 MED ORDER — OXYCODONE-ACETAMINOPHEN 5-325 MG PO TABS: 1.0000 | ORAL_TABLET | ORAL | Status: DC | PRN

## 2015-06-19 NOTE — Evaluation (Signed)
Occupational Therapy Evaluation and Discharge Note Patient Details Name: Gerald Spears MRN: [id number] DOB: 12-Aug-1941 Today's Date: 06/19/2015    History of Present Illness 74 y.o. male s/p Lt TKA. PMH: Rt TKA   Clinical Impression   Pt reports he was independent with ADLs PTA. Currently pt is overall supervision for ADLs and functional mobility. Pt noted to be slightly impulsive with transfers; reviewed need for supervision for safety upon return home, pt verbalized understanding. All education complete with pt and wife; pt and wife with no further questions or concerns for OT at this time. Pt plan to d/c home with 24/7 supervision from his wife. Pt ready to d/c home from an OT standpoint; signing off at this time. Thank you for this referral.     Follow Up Recommendations  No OT follow up;Supervision - Intermittent    Equipment Recommendations  None recommended by OT    Recommendations for Other Services       Precautions / Restrictions Precautions Precautions: Knee;Fall Restrictions Weight Bearing Restrictions: Yes LLE Weight Bearing: Weight bearing as tolerated      Mobility Bed Mobility               General bed mobility comments: Pt in recliner, returned to recliner at end of session  Transfers Overall transfer level: Needs assistance Equipment used: Rolling walker (2 wheeled) Transfers: Sit to/from Stand Sit to Stand: Supervision         General transfer comment: Supervision for safety. Sit to stand from chair x 1, toilet x 1.     Balance Overall balance assessment: Needs assistance Sitting-balance support: No upper extremity supported;Feet supported Sitting balance-Leahy Scale: Good     Standing balance support: Bilateral upper extremity supported Standing balance-Leahy Scale: Poor Standing balance comment: RW for support                             ADL Overall ADL's : Needs assistance/impaired Eating/Feeding: Set up;Sitting    Grooming: Wash/dry face;Supervision/safety;Standing Grooming Details (indicate cue type and reason): VC for walker placement at sink     Lower Body Bathing: Supervison/ safety;Sit to/from stand       Lower Body Dressing: Supervision/safety;Sit to/from stand Lower Body Dressing Details (indicate cue type and reason): Pt able to doff/don bilateral socks Toilet Transfer: Supervision/safety;Ambulation;Comfort height toilet;Grab bars;RW   Toileting- Clothing Manipulation and Hygiene: Supervision/safety;Sit to/from stand   Tub/ Shower Transfer: Walk-in shower;Min guard;Ambulation;Rolling walker Tub/Shower Transfer Details (indicate cue type and reason): Educated on walk in shower transfer technique; pt return demonstrated. Wife and pt educated on need for close supervision with transfers Functional mobility during ADLs: Supervision/safety;Rolling walker General ADL Comments: Wife and son present for OT eval. Educated pt on compensatory strategies for LB ADLs, need for close supervision during ADLs and functional mobility, safety with RW, home safety, edema management techniques; pt and wife verbalize understanding.      Vision     Perception     Praxis      Pertinent Vitals/Pain Pain Assessment: 0-10 Pain Score: 4  Pain Location: L knee Pain Descriptors / Indicators: Aching Pain Intervention(s): Limited activity within patient's tolerance;Monitored during session;Repositioned;Ice applied     Hand Dominance     Extremity/Trunk Assessment Upper Extremity Assessment Upper Extremity Assessment: Overall WFL for tasks assessed   Lower Extremity Assessment Lower Extremity Assessment: Defer to PT evaluation   Cervical / Trunk Assessment Cervical / Trunk Assessment: Normal   Communication  Communication Communication: No difficulties   Cognition Arousal/Alertness: Awake/alert Behavior During Therapy: WFL for tasks assessed/performed;Impulsive Overall Cognitive Status: Within  Functional Limits for tasks assessed                     General Comments       Exercises       Shoulder Instructions      Home Living Family/patient expects to be discharged to:: Private residence Living Arrangements: Spouse/significant other Available Help at Discharge: Family;Available 24 hours/day Type of Home: House Home Access: Stairs to enter [employer] of Steps: 2 Entrance Stairs-Rails: None Home Layout: One level     Bathroom Shower/Tub: Occupational psychologist: Handicapped height Bathroom Accessibility: Yes How Accessible: Accessible via walker Home Equipment: [place] - single point;Shower seat - built in;Grab bars - toilet          Prior Functioning/Environment Level of Independence: Independent with assistive device(s) (occasional use of cane)             OT Diagnosis: Acute pain   OT Problem List:     OT Treatment/Interventions:      OT Goals(Current goals can be found in the care plan section) Acute Rehab OT Goals Patient Stated Goal: to go home today OT Goal Formulation: With patient/family  OT Frequency:     Barriers to D/C:            Co-evaluation              End of Session Equipment Utilized During Treatment: Gait belt;Rolling walker CPM Left Knee CPM Left Knee: Off  Activity Tolerance: Patient tolerated treatment well Patient left: in chair;with call bell/phone within reach;with family/visitor present   Time: 0938-1829 OT Time Calculation (min): 15 min Charges:  OT General Charges $OT Visit: 1 Procedure OT Evaluation $Initial OT Evaluation Tier I: 1 Procedure G-Codes:     Binnie Kand M.S., OTR/L Pager: [phone]  06/19/2015, 10:00 AM

## 2015-06-19 NOTE — Discharge Summary (Signed)
Patient ID: Gerald Spears MRN: [id number] DOB/AGE: Jan 02, 1941 74 y.o.  Admit date: 06/18/2015 Discharge date: 06/19/2015  Admission Diagnoses:  Principal Problem:   Primary osteoarthritis of left knee Active Problems:   Arthritis of knee   Discharge Diagnoses:  Same  Past Medical History  Diagnosis Date  . UTI (lower urinary tract infection)   . DJD (degenerative joint disease)     Dr. Noemi Chapel end stages of DJD 2011  . Cancer [facility])     colon polyp  . Complication of anesthesia     difficult to wake up after surgery  . Hard of hearing   . Urinary frequency     Surgeries: Procedure(s): TOTAL KNEE ARTHROPLASTY on 06/18/2015   Consultants:    Discharged Condition: Improved  Hospital Course: Gerald Spears is an 74 y.o. male who was admitted 06/18/2015 for operative treatment ofPrimary osteoarthritis of left knee. Patient has severe unremitting pain that affects sleep, daily activities, and work/hobbies. After pre-op clearance the patient was taken to the operating room on 06/18/2015 and underwent  Procedure(s): TOTAL KNEE ARTHROPLASTY.    Patient was given perioperative antibiotics: Anti-infectives    Start     Dose/Rate Route Frequency Ordered Stop   06/18/15 1200  doxycycline (VIBRA-TABS) tablet 100 mg     100 mg Oral 2 times daily 06/18/15 1058     06/18/15 1058  valACYclovir (VALTREX) tablet 500 mg     500 mg Oral 2 times daily PRN 06/18/15 1058     06/18/15 0803  cefUROXime (ZINACEF) injection  Status:  Discontinued       As needed 06/18/15 0803 06/18/15 0916   06/18/15 0700  ceFAZolin (ANCEF) IVPB 2 g/50 mL premix     2 g 100 mL/hr over 30 Minutes Intravenous To Surgery 06/17/15 1608 06/18/15 0735       Patient was given sequential compression devices, early ambulation, and chemoprophylaxis to prevent DVT.  Patient benefited maximally from hospital stay and there were no complications.    Recent vital signs: Patient Vitals for the past 24 hrs:  BP  Temp Temp src Pulse Resp SpO2  06/19/15 1412 (!) 145/62 mmHg 99.7 F (37.6 C) Oral 87 - 100 %  06/19/15 0528 128/71 mmHg 98.5 F (36.9 C) Oral 71 19 100 %  06/19/15 0206 137/71 mmHg 97.8 F (36.6 C) Oral 71 19 100 %  06/18/15 2207 119/71 mmHg 99.3 F (37.4 C) Oral 70 20 100 %  06/18/15 2017 117/76 mmHg 98.4 F (36.9 C) Oral 68 20 98 %     Recent laboratory studies:  Recent Labs  06/19/15 0539  WBC 8.0  HGB 11.8*  HCT 35.2*  PLT 130*  NA 134*  K 5.3*  CL 101  CO2 27  BUN 14  CREATININE 0.91  GLUCOSE 145*  CALCIUM 9.0     Discharge Medications:     Medication List    TAKE these medications        aspirin EC 325 MG tablet  Take 1 tablet (325 mg total) by mouth 2 (two) times daily.     diclofenac sodium 1 % Gel  Commonly known as:  VOLTAREN  Apply topically daily.     doxycycline 100 MG tablet  Commonly known as:  VIBRA-TABS  Take 100 mg by mouth 2 (two) times daily.     multivitamin tablet  Take 1 tablet by mouth daily.     OVER THE COUNTER MEDICATION  Take 1 tablet by mouth daily. Instaflex advanced.  oxyCODONE-acetaminophen 5-325 MG tablet  Commonly known as:  ROXICET  Take 1 tablet by mouth every 4 (four) hours as needed for severe pain.     tamsulosin 0.4 MG Caps capsule  Commonly known as:  FLOMAX  Take 0.4 mg by mouth daily.     tiZANidine 2 MG tablet  Commonly known as:  ZANAFLEX  Take 1 tablet (2 mg total) by mouth every 6 (six) hours as needed for muscle spasms.     valACYclovir 500 MG tablet  Commonly known as:  VALTREX  Take 500 mg by mouth 2 (two) times daily as needed (Cold sore).        Diagnostic Studies: Dg Chest 2 View  06/07/2015  CLINICAL DATA:  Arthroplasty. EXAM: CHEST  2 VIEW COMPARISON:  05/23/2011. FINDINGS: Mediastinum and hilar structures normal. Lungs are clear. Heart size normal. No pleural effusion pneumothorax. No acute bony abnormality. IMPRESSION: No acute cardiopulmonary disease. Electronically Signed   By:  Marcello Moores  Register   On: 06/07/2015 11:18    Disposition: 01-Home or Self Care      Discharge Instructions    CPM    Complete by:  As directed   Continuous passive motion machine (CPM):      Use the CPM from 0 to 60  for 5 hours per day.      You may increase by 10 degrees per day.  You may break it up into 2 or 3 sessions per day.      Use CPM for 2 weeks or until you are told to stop.     Call MD / Call 911    Complete by:  As directed   If you experience chest pain or shortness of breath, CALL 911 and be transported to the hospital emergency room.  If you develope a fever above 101 F, pus (white drainage) or increased drainage or redness at the wound, or calf pain, call your surgeon's office.     Change dressing    Complete by:  As directed   Change dressing on 5, then change the dressing daily with sterile 4 x 4 inch gauze dressing and apply TED hose.  You may clean the incision with alcohol prior to redressing.     Constipation Prevention    Complete by:  As directed   Drink plenty of fluids.  Prune juice may be helpful.  You may use a stool softener, such as Colace (over the counter) 100 mg twice a day.  Use MiraLax (over the counter) for constipation as needed.     Diet - low sodium heart healthy    Complete by:  As directed      Discharge instructions    Complete by:  As directed   Follow up in office with Dr. Mayer Camel in 2 weeks.     Driving restrictions    Complete by:  As directed   No driving for 2 weeks     Increase activity slowly as tolerated    Complete by:  As directed      Patient may shower    Complete by:  As directed   You may shower without a dressing once there is no drainage.  Do not wash over the wound.  If drainage remains, cover wound with plastic wrap and then shower.           Follow-up Information    Follow up with [name], MD In 2 weeks.   Specialty:  Orthopedic Surgery   Contact  information:   [place]  [id number] [phone]        Signed: Hardin Negus Janelli Welling R 06/19/2015, 3:22 PM

## 2015-06-19 NOTE — Progress Notes (Signed)
Physical Therapy Treatment Patient Details Name: Gerald Spears MRN: [id number] DOB: 1940-12-29 Today's Date: 06/19/2015    History of Present Illness 74 y.o. male s/p Lt TKA. PMH: Rt TKA    PT Comments    Patient is making good progress with PT.  From a mobility standpoint anticipate patient will be ready for DC home. Patient and spouse without questions or concerns.      Follow Up Recommendations  Home health PT;Supervision for mobility/OOB     Equipment Recommendations  Rolling walker with 5" wheels    Recommendations for Other Services       Precautions / Restrictions Precautions Precautions: Knee Precaution Booklet Issued: Yes (comment) Precaution Comments: HEP provided, reviewed no pillow under knee Restrictions Weight Bearing Restrictions: Yes LLE Weight Bearing: Weight bearing as tolerated    Mobility  Bed Mobility Overal bed mobility: Needs Assistance Bed Mobility: Supine to Sit     Supine to sit: Independent     General bed mobility comments: bed flat no rail  Transfers Overall transfer level: Needs assistance Equipment used: Rolling walker (2 wheeled) Transfers: Sit to/from Stand Sit to Stand: Supervision         General transfer comment: no cues needed.   Ambulation/Gait Ambulation/Gait assistance: Min guard;Supervision Ambulation Distance (Feet): 30 Feet Assistive device: Rolling walker (2 wheeled) Gait Pattern/deviations: Step-through pattern Gait velocity: decreased   General Gait Details: cues for even strides and step through pattern   Stairs Stairs: Yes Stairs assistance: Min guard Stair Management: No rails;Step to pattern;Forwards;With crutches (single crutch on Rt) Number of Stairs: 2 General stair comments: Patient reports feeling confident after session, spouse observing and in agreement.   Wheelchair Mobility    Modified Rankin (Stroke Patients Only)       Balance Overall balance assessment: Needs  assistance Sitting-balance support: No upper extremity supported Sitting balance-Leahy Scale: Good     Standing balance support: During functional activity Standing balance-Leahy Scale: Fair Standing balance comment: using rw                    Cognition Arousal/Alertness: Awake/alert Behavior During Therapy: WFL for tasks assessed/performed;Impulsive Overall Cognitive Status: Within Functional Limits for tasks assessed                      Exercises Total Joint Exercises Long Arc Quad: Strengthening;Left;10 reps Knee Flexion: Left;20 reps;Seated - emphasis on hold and eliminating compensations    General Comments        Pertinent Vitals/Pain Pain Assessment: 0-10 Pain Score: 4  Pain Location: lt knee Pain Descriptors / Indicators: Aching Pain Intervention(s): Limited activity within patient's tolerance;Monitored during session    Home Living                      Prior Function            PT Goals (current goals can now be found in the care plan section) Acute Rehab PT Goals Patient Stated Goal: to go home today PT Goal Formulation: With patient Time For Goal Achievement: 07/02/15 Potential to Achieve Goals: Good Progress towards PT goals: Progressing toward goals    Frequency  7X/week    PT Plan Current plan remains appropriate    Co-evaluation             End of Session Equipment Utilized During Treatment: Gait belt Activity Tolerance: Patient tolerated treatment well Patient left: in chair;with call bell/phone within reach;with family/visitor present  Time: 9983-3825 PT Time Calculation (min) (ACUTE ONLY): 35 min  Charges:  $Gait Training: 8-22 mins $Therapeutic Exercise: 8-22 mins                    G Codes:      Cassell Clement, PT, CSCS Pager [phone] Office [phone]  06/19/2015, 4:05 PM

## 2015-06-19 NOTE — Care Management Note (Signed)
Case Management Note  Patient Details  Name: Gerald Spears MRN: [id number] Date of Birth: Sep 24, 1940  Subjective/Objective:    74 yr old male s/p left total knee arthroplasty.               Action/Plan: Patient was preoperatively setup with [facility], no changes.    Expected Discharge Date:    06/20/15              Expected Discharge Plan: Home with Home Health     In-House Referral:  NA  Discharge planning Services  CM Consult  Post Acute Care Choice:  Home Health, Durable Medical Equipment Choice offered to:  Patient  DME Arranged:  CPM, Walker rolling DME Agency:  [employer]  HH Arranged:  PT HH Agency:  [place]  Status of Service:  Completed, signed off  Medicare Important Message Given:    Date Medicare IM Given:    Medicare IM give by:    Date Additional Medicare IM Given:    Additional Medicare Important Message give by:     If discussed at [place] of Stay Meetings, dates discussed:    Additional Comments:  Ninfa Meeker, RN 06/19/2015, 3:25 PM

## 2015-06-19 NOTE — Progress Notes (Signed)
Physical Therapy Treatment Patient Details Name: Gerald Spears MRN: [id number] DOB: 1941/02/20 Today's Date: 06/19/2015    History of Present Illness 74 y.o. male s/p Lt TKA. PMH: Rt TKA    PT Comments    Patient is making good progress with PT.  From a mobility standpoint anticipate patient will be ready for DC home once able to complete stairs and seated portion of HEP.      Follow Up Recommendations  Home health PT;Supervision for mobility/OOB     Equipment Recommendations  Rolling walker with 5" wheels    Recommendations for Other Services       Precautions / Restrictions Precautions Precautions: Knee;Fall Precaution Booklet Issued: Yes (comment) Precaution Comments: HEP provided, reviewed no pillow under knee Restrictions Weight Bearing Restrictions: Yes LLE Weight Bearing: Weight bearing as tolerated    Mobility  Bed Mobility Overal bed mobility: Needs Assistance Bed Mobility: Supine to Sit     Supine to sit: Supervision     General bed mobility comments: bed flat no rail  Transfers Overall transfer level: Needs assistance Equipment used: Rolling walker (2 wheeled) Transfers: Sit to/from Stand Sit to Stand: Supervision         General transfer comment: no cues needed.   Ambulation/Gait Ambulation/Gait assistance: Min guard Ambulation Distance (Feet): 110 Feet Assistive device: Rolling walker (2 wheeled) Gait Pattern/deviations: Step-through pattern;Decreased step length - right;Decreased weight shift to left;Decreased stance time - left Gait velocity: decreased   General Gait Details: cues for even strides and step through pattern   Stairs            Wheelchair Mobility    Modified Rankin (Stroke Patients Only)       Balance Overall balance assessment: Needs assistance Sitting-balance support: No upper extremity supported Sitting balance-Leahy Scale: Good     Standing balance support: During functional activity Standing  balance-Leahy Scale: Fair Standing balance comment: using rw                    Cognition Arousal/Alertness: Awake/alert Behavior During Therapy: WFL for tasks assessed/performed;Impulsive Overall Cognitive Status: Within Functional Limits for tasks assessed                      Exercises Total Joint Exercises Ankle Circles/Pumps: AROM;Both;15 reps Quad Sets: Strengthening;Left;10 reps Short Arc Quad: Strengthening;Left;10 reps Heel Slides: AAROM;Left;10 reps Hip ABduction/ADduction: Strengthening;Left;10 reps Goniometric ROM: 72 degrees flexion    General Comments        Pertinent Vitals/Pain Pain Assessment: 0-10 Pain Score: 5  Pain Location: lt knee Pain Descriptors / Indicators: Aching Pain Intervention(s): Limited activity within patient's tolerance;Monitored during session    Home Living                      Prior Function            PT Goals (current goals can now be found in the care plan section) Acute Rehab PT Goals Patient Stated Goal: to go home today PT Goal Formulation: With patient Time For Goal Achievement: 07/02/15 Potential to Achieve Goals: Good Progress towards PT goals: Progressing toward goals    Frequency  7X/week    PT Plan Current plan remains appropriate    Co-evaluation             End of Session Equipment Utilized During Treatment: Gait belt Activity Tolerance: Patient tolerated treatment well Patient left: in chair;with call bell/phone within reach;with family/visitor present;Other (comment) (in  bone foam)     Time: 8937-3428 PT Time Calculation (min) (ACUTE ONLY): 33 min  Charges:  $Gait Training: 8-22 mins $Therapeutic Exercise: 8-22 mins                    G Codes:      Cassell Clement, PT, CSCS Pager [phone] Office [phone]  06/19/2015, 2:56 PM

## 2015-06-19 NOTE — Progress Notes (Signed)
Patient ID: Gerald Spears, male   DOB: 05-21-1941, 74 y.o.   MRN: [id number] PATIENT ID: Gerald Spears  MRN: [id number]  DOB/AGE:  16-Jul-1941 / 74 y.o.  1 Day Post-Op Procedure(s) (LRB): TOTAL KNEE ARTHROPLASTY (Left)    PROGRESS NOTE Subjective: Patient is alert, oriented, x1 Nausea, no Vomiting, yes passing gas, no Bowel Movement. Taking PO well. Denies SOB, Chest or Calf Pain. Using Incentive Spirometer, PAS in place. Ambulate 4', CPM 0-40 Patient reports pain as 3 on 0-10 scale  .    Objective: Vital signs in last 24 hours: Filed Vitals:   06/18/15 2017 06/18/15 2207 06/19/15 0206 06/19/15 0528  BP: 117/76 119/71 137/71 128/71  Pulse: 68 70 71 71  Temp: 98.4 F (36.9 C) 99.3 F (37.4 C) 97.8 F (36.6 C) 98.5 F (36.9 C)  TempSrc: Oral Oral Oral Oral  Resp: 20 20 19 19   Weight:      SpO2: 98% 100% 100% 100%      Intake/Output from previous day: I/O last 3 completed shifts: In: 4081 [P.O.:360; I.V.:2750] Out: 200 [Blood:200]   Intake/Output this shift:     LABORATORY DATA:  Recent Labs  06/19/15 0539  WBC 8.0  HGB 11.8*  HCT 35.2*  PLT 130*  NA 134*  K 5.3*  CL 101  CO2 27  BUN 14  CREATININE 0.91  GLUCOSE 145*  CALCIUM 9.0    Examination: Neurologically intact ABD soft Neurovascular intact Sensation intact distally Intact pulses distally Dorsiflexion/Plantar flexion intact Incision: dressing C/D/I No cellulitis present Compartment soft}  Assessment:   1 Day Post-Op Procedure(s) (LRB): TOTAL KNEE ARTHROPLASTY (Left) ADDITIONAL DIAGNOSIS: Expected Acute Blood Loss Anemia,   Plan: PT/OT WBAT, CPM 5/hrs day until ROM 0-90 degrees, then D/C CPM DVT Prophylaxis:  SCDx72hrs, ASA 325 mg BID x 2 weeks DISCHARGE PLAN: Home, when passes PT, possibly today PM DISCHARGE NEEDS: HHPT, CPM, Walker and 3-in-1 comode seat     Mailynn Everly J 06/19/2015, 7:40 AM

## 2015-06-20 DIAGNOSIS — M1990 Unspecified osteoarthritis, unspecified site: Secondary | ICD-10-CM | POA: Diagnosis not present

## 2015-06-20 DIAGNOSIS — Z96653 Presence of artificial knee joint, bilateral: Secondary | ICD-10-CM | POA: Diagnosis not present

## 2015-06-20 DIAGNOSIS — Z471 Aftercare following joint replacement surgery: Secondary | ICD-10-CM | POA: Diagnosis not present

## 2015-06-20 NOTE — Progress Notes (Signed)
AVS reviewed and d/c teaching completed by Charge RN. Patient taken via w/c by nursing tech with wife and personal belongings to [place] tower exit for d/c.

## 2015-06-21 DIAGNOSIS — M1712 Unilateral primary osteoarthritis, left knee: Secondary | ICD-10-CM | POA: Diagnosis not present

## 2015-06-22 DIAGNOSIS — Z471 Aftercare following joint replacement surgery: Secondary | ICD-10-CM | POA: Diagnosis not present

## 2015-06-22 DIAGNOSIS — Z96653 Presence of artificial knee joint, bilateral: Secondary | ICD-10-CM | POA: Diagnosis not present

## 2015-06-22 DIAGNOSIS — M1990 Unspecified osteoarthritis, unspecified site: Secondary | ICD-10-CM | POA: Diagnosis not present

## 2015-06-25 DIAGNOSIS — M1990 Unspecified osteoarthritis, unspecified site: Secondary | ICD-10-CM | POA: Diagnosis not present

## 2015-06-25 DIAGNOSIS — Z96653 Presence of artificial knee joint, bilateral: Secondary | ICD-10-CM | POA: Diagnosis not present

## 2015-06-25 DIAGNOSIS — Z471 Aftercare following joint replacement surgery: Secondary | ICD-10-CM | POA: Diagnosis not present

## 2015-06-27 DIAGNOSIS — Z471 Aftercare following joint replacement surgery: Secondary | ICD-10-CM | POA: Diagnosis not present

## 2015-06-27 DIAGNOSIS — Z96653 Presence of artificial knee joint, bilateral: Secondary | ICD-10-CM | POA: Diagnosis not present

## 2015-06-27 DIAGNOSIS — M1990 Unspecified osteoarthritis, unspecified site: Secondary | ICD-10-CM | POA: Diagnosis not present

## 2015-06-29 DIAGNOSIS — M1990 Unspecified osteoarthritis, unspecified site: Secondary | ICD-10-CM | POA: Diagnosis not present

## 2015-06-29 DIAGNOSIS — Z96653 Presence of artificial knee joint, bilateral: Secondary | ICD-10-CM | POA: Diagnosis not present

## 2015-06-29 DIAGNOSIS — Z471 Aftercare following joint replacement surgery: Secondary | ICD-10-CM | POA: Diagnosis not present

## 2015-07-02 DIAGNOSIS — M25562 Pain in left knee: Secondary | ICD-10-CM | POA: Diagnosis not present

## 2015-07-02 DIAGNOSIS — M6281 Muscle weakness (generalized): Secondary | ICD-10-CM | POA: Diagnosis not present

## 2015-07-02 DIAGNOSIS — R2689 Other abnormalities of gait and mobility: Secondary | ICD-10-CM | POA: Diagnosis not present

## 2015-07-02 DIAGNOSIS — M25662 Stiffness of left knee, not elsewhere classified: Secondary | ICD-10-CM | POA: Diagnosis not present

## 2015-07-04 DIAGNOSIS — M25562 Pain in left knee: Secondary | ICD-10-CM | POA: Diagnosis not present

## 2015-07-04 DIAGNOSIS — Z471 Aftercare following joint replacement surgery: Secondary | ICD-10-CM | POA: Diagnosis not present

## 2015-07-04 DIAGNOSIS — Z96652 Presence of left artificial knee joint: Secondary | ICD-10-CM | POA: Diagnosis not present

## 2015-07-04 DIAGNOSIS — M6281 Muscle weakness (generalized): Secondary | ICD-10-CM | POA: Diagnosis not present

## 2015-07-04 DIAGNOSIS — M25662 Stiffness of left knee, not elsewhere classified: Secondary | ICD-10-CM | POA: Diagnosis not present

## 2015-07-04 DIAGNOSIS — R2689 Other abnormalities of gait and mobility: Secondary | ICD-10-CM | POA: Diagnosis not present

## 2015-07-09 DIAGNOSIS — M25562 Pain in left knee: Secondary | ICD-10-CM | POA: Diagnosis not present

## 2015-07-09 DIAGNOSIS — M25662 Stiffness of left knee, not elsewhere classified: Secondary | ICD-10-CM | POA: Diagnosis not present

## 2015-07-09 DIAGNOSIS — M6281 Muscle weakness (generalized): Secondary | ICD-10-CM | POA: Diagnosis not present

## 2015-07-09 DIAGNOSIS — R2689 Other abnormalities of gait and mobility: Secondary | ICD-10-CM | POA: Diagnosis not present

## 2015-07-10 DIAGNOSIS — Z0000 Encounter for general adult medical examination without abnormal findings: Secondary | ICD-10-CM | POA: Diagnosis not present

## 2015-07-10 DIAGNOSIS — N400 Enlarged prostate without lower urinary tract symptoms: Secondary | ICD-10-CM | POA: Diagnosis not present

## 2015-07-10 DIAGNOSIS — E7800 Pure hypercholesterolemia, unspecified: Secondary | ICD-10-CM | POA: Diagnosis not present

## 2015-07-10 DIAGNOSIS — Z125 Encounter for screening for malignant neoplasm of prostate: Secondary | ICD-10-CM | POA: Diagnosis not present

## 2015-07-10 DIAGNOSIS — Z23 Encounter for immunization: Secondary | ICD-10-CM | POA: Diagnosis not present

## 2015-07-11 DIAGNOSIS — R2689 Other abnormalities of gait and mobility: Secondary | ICD-10-CM | POA: Diagnosis not present

## 2015-07-11 DIAGNOSIS — M6281 Muscle weakness (generalized): Secondary | ICD-10-CM | POA: Diagnosis not present

## 2015-07-11 DIAGNOSIS — M25562 Pain in left knee: Secondary | ICD-10-CM | POA: Diagnosis not present

## 2015-07-16 DIAGNOSIS — M6281 Muscle weakness (generalized): Secondary | ICD-10-CM | POA: Diagnosis not present

## 2015-07-16 DIAGNOSIS — M25662 Stiffness of left knee, not elsewhere classified: Secondary | ICD-10-CM | POA: Diagnosis not present

## 2015-07-16 DIAGNOSIS — R2689 Other abnormalities of gait and mobility: Secondary | ICD-10-CM | POA: Diagnosis not present

## 2015-07-16 DIAGNOSIS — M25562 Pain in left knee: Secondary | ICD-10-CM | POA: Diagnosis not present

## 2015-07-18 DIAGNOSIS — M25662 Stiffness of left knee, not elsewhere classified: Secondary | ICD-10-CM | POA: Diagnosis not present

## 2015-07-18 DIAGNOSIS — R2689 Other abnormalities of gait and mobility: Secondary | ICD-10-CM | POA: Diagnosis not present

## 2015-07-18 DIAGNOSIS — M25562 Pain in left knee: Secondary | ICD-10-CM | POA: Diagnosis not present

## 2015-07-18 DIAGNOSIS — M6281 Muscle weakness (generalized): Secondary | ICD-10-CM | POA: Diagnosis not present

## 2015-07-23 DIAGNOSIS — R2689 Other abnormalities of gait and mobility: Secondary | ICD-10-CM | POA: Diagnosis not present

## 2015-07-23 DIAGNOSIS — M6281 Muscle weakness (generalized): Secondary | ICD-10-CM | POA: Diagnosis not present

## 2015-07-23 DIAGNOSIS — M25562 Pain in left knee: Secondary | ICD-10-CM | POA: Diagnosis not present

## 2015-07-23 DIAGNOSIS — M25662 Stiffness of left knee, not elsewhere classified: Secondary | ICD-10-CM | POA: Diagnosis not present

## 2015-07-25 DIAGNOSIS — M25662 Stiffness of left knee, not elsewhere classified: Secondary | ICD-10-CM | POA: Diagnosis not present

## 2015-07-25 DIAGNOSIS — M25562 Pain in left knee: Secondary | ICD-10-CM | POA: Diagnosis not present

## 2015-07-25 DIAGNOSIS — R2689 Other abnormalities of gait and mobility: Secondary | ICD-10-CM | POA: Diagnosis not present

## 2015-07-25 DIAGNOSIS — M6281 Muscle weakness (generalized): Secondary | ICD-10-CM | POA: Diagnosis not present

## 2015-07-30 DIAGNOSIS — M6281 Muscle weakness (generalized): Secondary | ICD-10-CM | POA: Diagnosis not present

## 2015-07-30 DIAGNOSIS — M25662 Stiffness of left knee, not elsewhere classified: Secondary | ICD-10-CM | POA: Diagnosis not present

## 2015-07-30 DIAGNOSIS — R2689 Other abnormalities of gait and mobility: Secondary | ICD-10-CM | POA: Diagnosis not present

## 2015-07-30 DIAGNOSIS — M25562 Pain in left knee: Secondary | ICD-10-CM | POA: Diagnosis not present

## 2015-07-31 ENCOUNTER — Other Ambulatory Visit: Payer: Self-pay | Admitting: Cardiology

## 2015-07-31 DIAGNOSIS — R0602 Shortness of breath: Secondary | ICD-10-CM

## 2015-07-31 DIAGNOSIS — R5383 Other fatigue: Secondary | ICD-10-CM | POA: Diagnosis not present

## 2015-08-01 DIAGNOSIS — M6281 Muscle weakness (generalized): Secondary | ICD-10-CM | POA: Diagnosis not present

## 2015-08-01 DIAGNOSIS — M25662 Stiffness of left knee, not elsewhere classified: Secondary | ICD-10-CM | POA: Diagnosis not present

## 2015-08-01 DIAGNOSIS — R2689 Other abnormalities of gait and mobility: Secondary | ICD-10-CM | POA: Diagnosis not present

## 2015-08-01 DIAGNOSIS — M25562 Pain in left knee: Secondary | ICD-10-CM | POA: Diagnosis not present

## 2015-08-06 DIAGNOSIS — R0609 Other forms of dyspnea: Secondary | ICD-10-CM | POA: Diagnosis not present

## 2015-08-08 ENCOUNTER — Ambulatory Visit
Admission: RE | Admit: 2015-08-08 | Discharge: 2015-08-08 | Disposition: A | Discharge status: Home / Self Care | Payer: Medicare Other | Source: Ambulatory Visit | Attending: Cardiology | Admitting: Cardiology

## 2015-08-08 DIAGNOSIS — R0602 Shortness of breath: Secondary | ICD-10-CM

## 2015-08-08 DIAGNOSIS — M25662 Stiffness of left knee, not elsewhere classified: Secondary | ICD-10-CM | POA: Diagnosis not present

## 2015-08-08 DIAGNOSIS — M6281 Muscle weakness (generalized): Secondary | ICD-10-CM | POA: Diagnosis not present

## 2015-08-08 DIAGNOSIS — R2689 Other abnormalities of gait and mobility: Secondary | ICD-10-CM | POA: Diagnosis not present

## 2015-08-08 DIAGNOSIS — M25562 Pain in left knee: Secondary | ICD-10-CM | POA: Diagnosis not present

## 2015-08-08 MED ORDER — IOPAMIDOL (ISOVUE-300) INJECTION 61%
75.0000 mL | Freq: Once | INTRAVENOUS | Status: AC | PRN
  Administered 2015-08-08: 75 mL via INTRAVENOUS

## 2015-08-09 DIAGNOSIS — R0609 Other forms of dyspnea: Secondary | ICD-10-CM | POA: Diagnosis not present

## 2015-08-09 DIAGNOSIS — R0602 Shortness of breath: Secondary | ICD-10-CM | POA: Diagnosis not present

## 2015-08-09 DIAGNOSIS — R5383 Other fatigue: Secondary | ICD-10-CM | POA: Diagnosis not present

## 2015-09-06 DIAGNOSIS — R0609 Other forms of dyspnea: Secondary | ICD-10-CM | POA: Diagnosis not present

## 2015-09-06 DIAGNOSIS — R5383 Other fatigue: Secondary | ICD-10-CM | POA: Diagnosis not present

## 2015-09-06 DIAGNOSIS — R0602 Shortness of breath: Secondary | ICD-10-CM | POA: Diagnosis not present

## 2015-09-13 DIAGNOSIS — M25562 Pain in left knee: Secondary | ICD-10-CM | POA: Diagnosis not present

## 2015-09-13 DIAGNOSIS — M25561 Pain in right knee: Secondary | ICD-10-CM | POA: Diagnosis not present

## 2015-09-17 DIAGNOSIS — R0609 Other forms of dyspnea: Secondary | ICD-10-CM | POA: Diagnosis not present

## 2015-09-17 DIAGNOSIS — R0602 Shortness of breath: Secondary | ICD-10-CM | POA: Diagnosis not present

## 2015-09-17 DIAGNOSIS — E785 Hyperlipidemia, unspecified: Secondary | ICD-10-CM | POA: Diagnosis not present

## 2015-09-17 DIAGNOSIS — R5383 Other fatigue: Secondary | ICD-10-CM | POA: Diagnosis not present

## 2015-09-17 DIAGNOSIS — I2510 Atherosclerotic heart disease of native coronary artery without angina pectoris: Secondary | ICD-10-CM | POA: Diagnosis not present

## 2015-09-18 DIAGNOSIS — R262 Difficulty in walking, not elsewhere classified: Secondary | ICD-10-CM | POA: Diagnosis not present

## 2015-09-18 DIAGNOSIS — M25662 Stiffness of left knee, not elsewhere classified: Secondary | ICD-10-CM | POA: Diagnosis not present

## 2015-09-18 DIAGNOSIS — M25562 Pain in left knee: Secondary | ICD-10-CM | POA: Diagnosis not present

## 2015-09-18 DIAGNOSIS — M25661 Stiffness of right knee, not elsewhere classified: Secondary | ICD-10-CM | POA: Diagnosis not present

## 2015-09-24 DIAGNOSIS — M6281 Muscle weakness (generalized): Secondary | ICD-10-CM | POA: Diagnosis not present

## 2015-09-24 DIAGNOSIS — M25562 Pain in left knee: Secondary | ICD-10-CM | POA: Diagnosis not present

## 2015-09-24 DIAGNOSIS — M25661 Stiffness of right knee, not elsewhere classified: Secondary | ICD-10-CM | POA: Diagnosis not present

## 2015-09-24 DIAGNOSIS — R2689 Other abnormalities of gait and mobility: Secondary | ICD-10-CM | POA: Diagnosis not present

## 2015-09-26 DIAGNOSIS — M25562 Pain in left knee: Secondary | ICD-10-CM | POA: Diagnosis not present

## 2015-09-26 DIAGNOSIS — M25662 Stiffness of left knee, not elsewhere classified: Secondary | ICD-10-CM | POA: Diagnosis not present

## 2015-09-26 DIAGNOSIS — M25661 Stiffness of right knee, not elsewhere classified: Secondary | ICD-10-CM | POA: Diagnosis not present

## 2015-09-26 DIAGNOSIS — M6281 Muscle weakness (generalized): Secondary | ICD-10-CM | POA: Diagnosis not present

## 2015-10-01 DIAGNOSIS — M6281 Muscle weakness (generalized): Secondary | ICD-10-CM | POA: Diagnosis not present

## 2015-10-01 DIAGNOSIS — M25562 Pain in left knee: Secondary | ICD-10-CM | POA: Diagnosis not present

## 2015-10-01 DIAGNOSIS — M25661 Stiffness of right knee, not elsewhere classified: Secondary | ICD-10-CM | POA: Diagnosis not present

## 2015-10-01 DIAGNOSIS — R2689 Other abnormalities of gait and mobility: Secondary | ICD-10-CM | POA: Diagnosis not present

## 2015-10-03 DIAGNOSIS — M25562 Pain in left knee: Secondary | ICD-10-CM | POA: Diagnosis not present

## 2015-10-03 DIAGNOSIS — M25661 Stiffness of right knee, not elsewhere classified: Secondary | ICD-10-CM | POA: Diagnosis not present

## 2015-10-03 DIAGNOSIS — R2689 Other abnormalities of gait and mobility: Secondary | ICD-10-CM | POA: Diagnosis not present

## 2015-10-03 DIAGNOSIS — M6281 Muscle weakness (generalized): Secondary | ICD-10-CM | POA: Diagnosis not present

## 2015-10-08 DIAGNOSIS — M25662 Stiffness of left knee, not elsewhere classified: Secondary | ICD-10-CM | POA: Diagnosis not present

## 2015-10-08 DIAGNOSIS — M25661 Stiffness of right knee, not elsewhere classified: Secondary | ICD-10-CM | POA: Diagnosis not present

## 2015-10-08 DIAGNOSIS — M6281 Muscle weakness (generalized): Secondary | ICD-10-CM | POA: Diagnosis not present

## 2015-10-08 DIAGNOSIS — M25562 Pain in left knee: Secondary | ICD-10-CM | POA: Diagnosis not present

## 2015-10-10 DIAGNOSIS — R2689 Other abnormalities of gait and mobility: Secondary | ICD-10-CM | POA: Diagnosis not present

## 2015-10-10 DIAGNOSIS — M25661 Stiffness of right knee, not elsewhere classified: Secondary | ICD-10-CM | POA: Diagnosis not present

## 2015-10-10 DIAGNOSIS — M25562 Pain in left knee: Secondary | ICD-10-CM | POA: Diagnosis not present

## 2015-10-10 DIAGNOSIS — M6281 Muscle weakness (generalized): Secondary | ICD-10-CM | POA: Diagnosis not present

## 2015-10-15 DIAGNOSIS — M6281 Muscle weakness (generalized): Secondary | ICD-10-CM | POA: Diagnosis not present

## 2015-10-15 DIAGNOSIS — R2689 Other abnormalities of gait and mobility: Secondary | ICD-10-CM | POA: Diagnosis not present

## 2015-10-15 DIAGNOSIS — M25562 Pain in left knee: Secondary | ICD-10-CM | POA: Diagnosis not present

## 2015-10-15 DIAGNOSIS — M25661 Stiffness of right knee, not elsewhere classified: Secondary | ICD-10-CM | POA: Diagnosis not present

## 2015-10-17 DIAGNOSIS — M25562 Pain in left knee: Secondary | ICD-10-CM | POA: Diagnosis not present

## 2015-10-17 DIAGNOSIS — R2689 Other abnormalities of gait and mobility: Secondary | ICD-10-CM | POA: Diagnosis not present

## 2015-10-17 DIAGNOSIS — M25661 Stiffness of right knee, not elsewhere classified: Secondary | ICD-10-CM | POA: Diagnosis not present

## 2015-10-17 DIAGNOSIS — M6281 Muscle weakness (generalized): Secondary | ICD-10-CM | POA: Diagnosis not present

## 2015-10-24 DIAGNOSIS — M25562 Pain in left knee: Secondary | ICD-10-CM | POA: Diagnosis not present

## 2015-10-24 DIAGNOSIS — R2689 Other abnormalities of gait and mobility: Secondary | ICD-10-CM | POA: Diagnosis not present

## 2015-10-24 DIAGNOSIS — M25661 Stiffness of right knee, not elsewhere classified: Secondary | ICD-10-CM | POA: Diagnosis not present

## 2015-10-24 DIAGNOSIS — M6281 Muscle weakness (generalized): Secondary | ICD-10-CM | POA: Diagnosis not present

## 2015-10-30 DIAGNOSIS — M25661 Stiffness of right knee, not elsewhere classified: Secondary | ICD-10-CM | POA: Diagnosis not present

## 2015-10-30 DIAGNOSIS — M25662 Stiffness of left knee, not elsewhere classified: Secondary | ICD-10-CM | POA: Diagnosis not present

## 2015-10-30 DIAGNOSIS — M25562 Pain in left knee: Secondary | ICD-10-CM | POA: Diagnosis not present

## 2015-10-30 DIAGNOSIS — M6281 Muscle weakness (generalized): Secondary | ICD-10-CM | POA: Diagnosis not present

## 2015-11-27 DIAGNOSIS — M25562 Pain in left knee: Secondary | ICD-10-CM | POA: Diagnosis not present

## 2016-01-17 DIAGNOSIS — H25093 Other age-related incipient cataract, bilateral: Secondary | ICD-10-CM | POA: Diagnosis not present

## 2016-01-17 DIAGNOSIS — D3132 Benign neoplasm of left choroid: Secondary | ICD-10-CM | POA: Diagnosis not present

## 2016-02-05 DIAGNOSIS — M9903 Segmental and somatic dysfunction of lumbar region: Secondary | ICD-10-CM | POA: Diagnosis not present

## 2016-02-05 DIAGNOSIS — M9906 Segmental and somatic dysfunction of lower extremity: Secondary | ICD-10-CM | POA: Diagnosis not present

## 2016-02-05 DIAGNOSIS — M791 Myalgia: Secondary | ICD-10-CM | POA: Diagnosis not present

## 2016-02-05 DIAGNOSIS — M9905 Segmental and somatic dysfunction of pelvic region: Secondary | ICD-10-CM | POA: Diagnosis not present

## 2016-02-05 DIAGNOSIS — M9902 Segmental and somatic dysfunction of thoracic region: Secondary | ICD-10-CM | POA: Diagnosis not present

## 2016-02-05 DIAGNOSIS — M62830 Muscle spasm of back: Secondary | ICD-10-CM | POA: Diagnosis not present

## 2016-02-08 DIAGNOSIS — M791 Myalgia: Secondary | ICD-10-CM | POA: Diagnosis not present

## 2016-02-08 DIAGNOSIS — M9903 Segmental and somatic dysfunction of lumbar region: Secondary | ICD-10-CM | POA: Diagnosis not present

## 2016-02-08 DIAGNOSIS — M9906 Segmental and somatic dysfunction of lower extremity: Secondary | ICD-10-CM | POA: Diagnosis not present

## 2016-02-08 DIAGNOSIS — M9905 Segmental and somatic dysfunction of pelvic region: Secondary | ICD-10-CM | POA: Diagnosis not present

## 2016-02-08 DIAGNOSIS — M62830 Muscle spasm of back: Secondary | ICD-10-CM | POA: Diagnosis not present

## 2016-02-08 DIAGNOSIS — M9902 Segmental and somatic dysfunction of thoracic region: Secondary | ICD-10-CM | POA: Diagnosis not present

## 2016-02-12 DIAGNOSIS — M9906 Segmental and somatic dysfunction of lower extremity: Secondary | ICD-10-CM | POA: Diagnosis not present

## 2016-02-12 DIAGNOSIS — M791 Myalgia: Secondary | ICD-10-CM | POA: Diagnosis not present

## 2016-02-12 DIAGNOSIS — M9903 Segmental and somatic dysfunction of lumbar region: Secondary | ICD-10-CM | POA: Diagnosis not present

## 2016-02-12 DIAGNOSIS — M9905 Segmental and somatic dysfunction of pelvic region: Secondary | ICD-10-CM | POA: Diagnosis not present

## 2016-02-12 DIAGNOSIS — M9902 Segmental and somatic dysfunction of thoracic region: Secondary | ICD-10-CM | POA: Diagnosis not present

## 2016-02-12 DIAGNOSIS — M62830 Muscle spasm of back: Secondary | ICD-10-CM | POA: Diagnosis not present

## 2016-02-14 DIAGNOSIS — M9902 Segmental and somatic dysfunction of thoracic region: Secondary | ICD-10-CM | POA: Diagnosis not present

## 2016-02-14 DIAGNOSIS — M62830 Muscle spasm of back: Secondary | ICD-10-CM | POA: Diagnosis not present

## 2016-02-14 DIAGNOSIS — M9906 Segmental and somatic dysfunction of lower extremity: Secondary | ICD-10-CM | POA: Diagnosis not present

## 2016-02-14 DIAGNOSIS — M9905 Segmental and somatic dysfunction of pelvic region: Secondary | ICD-10-CM | POA: Diagnosis not present

## 2016-02-14 DIAGNOSIS — M9903 Segmental and somatic dysfunction of lumbar region: Secondary | ICD-10-CM | POA: Diagnosis not present

## 2016-02-14 DIAGNOSIS — M791 Myalgia: Secondary | ICD-10-CM | POA: Diagnosis not present

## 2016-02-18 DIAGNOSIS — M62830 Muscle spasm of back: Secondary | ICD-10-CM | POA: Diagnosis not present

## 2016-02-18 DIAGNOSIS — M791 Myalgia: Secondary | ICD-10-CM | POA: Diagnosis not present

## 2016-02-18 DIAGNOSIS — M9906 Segmental and somatic dysfunction of lower extremity: Secondary | ICD-10-CM | POA: Diagnosis not present

## 2016-02-18 DIAGNOSIS — M9903 Segmental and somatic dysfunction of lumbar region: Secondary | ICD-10-CM | POA: Diagnosis not present

## 2016-02-18 DIAGNOSIS — M9902 Segmental and somatic dysfunction of thoracic region: Secondary | ICD-10-CM | POA: Diagnosis not present

## 2016-02-18 DIAGNOSIS — M9905 Segmental and somatic dysfunction of pelvic region: Secondary | ICD-10-CM | POA: Diagnosis not present

## 2016-02-22 DIAGNOSIS — M62830 Muscle spasm of back: Secondary | ICD-10-CM | POA: Diagnosis not present

## 2016-02-22 DIAGNOSIS — M9903 Segmental and somatic dysfunction of lumbar region: Secondary | ICD-10-CM | POA: Diagnosis not present

## 2016-02-22 DIAGNOSIS — M9905 Segmental and somatic dysfunction of pelvic region: Secondary | ICD-10-CM | POA: Diagnosis not present

## 2016-02-22 DIAGNOSIS — M9906 Segmental and somatic dysfunction of lower extremity: Secondary | ICD-10-CM | POA: Diagnosis not present

## 2016-02-22 DIAGNOSIS — M791 Myalgia: Secondary | ICD-10-CM | POA: Diagnosis not present

## 2016-02-22 DIAGNOSIS — M9902 Segmental and somatic dysfunction of thoracic region: Secondary | ICD-10-CM | POA: Diagnosis not present

## 2016-02-26 DIAGNOSIS — M62830 Muscle spasm of back: Secondary | ICD-10-CM | POA: Diagnosis not present

## 2016-02-26 DIAGNOSIS — M791 Myalgia: Secondary | ICD-10-CM | POA: Diagnosis not present

## 2016-02-26 DIAGNOSIS — M9905 Segmental and somatic dysfunction of pelvic region: Secondary | ICD-10-CM | POA: Diagnosis not present

## 2016-02-26 DIAGNOSIS — M9903 Segmental and somatic dysfunction of lumbar region: Secondary | ICD-10-CM | POA: Diagnosis not present

## 2016-02-26 DIAGNOSIS — M9906 Segmental and somatic dysfunction of lower extremity: Secondary | ICD-10-CM | POA: Diagnosis not present

## 2016-02-26 DIAGNOSIS — M9902 Segmental and somatic dysfunction of thoracic region: Secondary | ICD-10-CM | POA: Diagnosis not present

## 2016-02-29 DIAGNOSIS — M9902 Segmental and somatic dysfunction of thoracic region: Secondary | ICD-10-CM | POA: Diagnosis not present

## 2016-02-29 DIAGNOSIS — M9903 Segmental and somatic dysfunction of lumbar region: Secondary | ICD-10-CM | POA: Diagnosis not present

## 2016-02-29 DIAGNOSIS — M62830 Muscle spasm of back: Secondary | ICD-10-CM | POA: Diagnosis not present

## 2016-02-29 DIAGNOSIS — M791 Myalgia: Secondary | ICD-10-CM | POA: Diagnosis not present

## 2016-02-29 DIAGNOSIS — M9905 Segmental and somatic dysfunction of pelvic region: Secondary | ICD-10-CM | POA: Diagnosis not present

## 2016-02-29 DIAGNOSIS — M9906 Segmental and somatic dysfunction of lower extremity: Secondary | ICD-10-CM | POA: Diagnosis not present

## 2016-03-04 DIAGNOSIS — M9903 Segmental and somatic dysfunction of lumbar region: Secondary | ICD-10-CM | POA: Diagnosis not present

## 2016-03-04 DIAGNOSIS — M9906 Segmental and somatic dysfunction of lower extremity: Secondary | ICD-10-CM | POA: Diagnosis not present

## 2016-03-04 DIAGNOSIS — M62830 Muscle spasm of back: Secondary | ICD-10-CM | POA: Diagnosis not present

## 2016-03-04 DIAGNOSIS — M9905 Segmental and somatic dysfunction of pelvic region: Secondary | ICD-10-CM | POA: Diagnosis not present

## 2016-03-04 DIAGNOSIS — M9902 Segmental and somatic dysfunction of thoracic region: Secondary | ICD-10-CM | POA: Diagnosis not present

## 2016-03-04 DIAGNOSIS — M791 Myalgia: Secondary | ICD-10-CM | POA: Diagnosis not present

## 2016-03-07 DIAGNOSIS — M791 Myalgia: Secondary | ICD-10-CM | POA: Diagnosis not present

## 2016-03-07 DIAGNOSIS — M9903 Segmental and somatic dysfunction of lumbar region: Secondary | ICD-10-CM | POA: Diagnosis not present

## 2016-03-07 DIAGNOSIS — M9902 Segmental and somatic dysfunction of thoracic region: Secondary | ICD-10-CM | POA: Diagnosis not present

## 2016-03-07 DIAGNOSIS — M62830 Muscle spasm of back: Secondary | ICD-10-CM | POA: Diagnosis not present

## 2016-03-07 DIAGNOSIS — M9906 Segmental and somatic dysfunction of lower extremity: Secondary | ICD-10-CM | POA: Diagnosis not present

## 2016-03-07 DIAGNOSIS — M9905 Segmental and somatic dysfunction of pelvic region: Secondary | ICD-10-CM | POA: Diagnosis not present

## 2016-05-16 DIAGNOSIS — Z85820 Personal history of malignant melanoma of skin: Secondary | ICD-10-CM | POA: Diagnosis not present

## 2016-05-16 DIAGNOSIS — L570 Actinic keratosis: Secondary | ICD-10-CM | POA: Diagnosis not present

## 2016-05-16 DIAGNOSIS — Z87898 Personal history of other specified conditions: Secondary | ICD-10-CM | POA: Diagnosis not present

## 2016-05-16 DIAGNOSIS — L820 Inflamed seborrheic keratosis: Secondary | ICD-10-CM | POA: Diagnosis not present

## 2016-05-16 DIAGNOSIS — L819 Disorder of pigmentation, unspecified: Secondary | ICD-10-CM | POA: Diagnosis not present

## 2016-05-16 DIAGNOSIS — Z87891 Personal history of nicotine dependence: Secondary | ICD-10-CM | POA: Diagnosis not present

## 2016-05-25 DIAGNOSIS — Z23 Encounter for immunization: Secondary | ICD-10-CM | POA: Diagnosis not present

## 2016-08-07 DIAGNOSIS — J069 Acute upper respiratory infection, unspecified: Secondary | ICD-10-CM | POA: Diagnosis not present

## 2016-08-07 DIAGNOSIS — R1909 Other intra-abdominal and pelvic swelling, mass and lump: Secondary | ICD-10-CM | POA: Diagnosis not present

## 2016-08-22 ENCOUNTER — Other Ambulatory Visit: Payer: Self-pay | Admitting: Physician Assistant

## 2016-08-22 DIAGNOSIS — R1909 Other intra-abdominal and pelvic swelling, mass and lump: Secondary | ICD-10-CM

## 2016-08-26 ENCOUNTER — Ambulatory Visit
Admission: RE | Admit: 2016-08-26 | Discharge: 2016-08-26 | Disposition: A | Discharge status: Home / Self Care | Payer: Medicare Other | Source: Ambulatory Visit | Attending: Physician Assistant | Admitting: Physician Assistant

## 2016-08-26 DIAGNOSIS — K449 Diaphragmatic hernia without obstruction or gangrene: Secondary | ICD-10-CM | POA: Diagnosis not present

## 2016-08-26 DIAGNOSIS — R1909 Other intra-abdominal and pelvic swelling, mass and lump: Secondary | ICD-10-CM

## 2016-09-22 ENCOUNTER — Ambulatory Visit: Payer: Self-pay | Admitting: Surgery

## 2016-09-22 DIAGNOSIS — I2510 Atherosclerotic heart disease of native coronary artery without angina pectoris: Secondary | ICD-10-CM | POA: Diagnosis not present

## 2016-09-22 DIAGNOSIS — R0602 Shortness of breath: Secondary | ICD-10-CM | POA: Diagnosis not present

## 2016-09-22 DIAGNOSIS — Z01810 Encounter for preprocedural cardiovascular examination: Secondary | ICD-10-CM | POA: Diagnosis not present

## 2016-09-22 DIAGNOSIS — K4021 Bilateral inguinal hernia, without obstruction or gangrene, recurrent: Secondary | ICD-10-CM | POA: Diagnosis not present

## 2016-09-22 DIAGNOSIS — Z01818 Encounter for other preprocedural examination: Secondary | ICD-10-CM | POA: Diagnosis not present

## 2016-09-22 DIAGNOSIS — E785 Hyperlipidemia, unspecified: Secondary | ICD-10-CM | POA: Diagnosis not present

## 2016-09-22 NOTE — H&P (Signed)
Gerald Spears 09/22/2016 9:21 AM Location: [facility] Patient #: [id number] DOB: [birth year] Married / Language: Gerald Spears / Race: White Male   History of Present Illness [name] MD; 09/22/2016 10:19 AM) The patient is a 76 year old male who presents with an inguinal hernia. Note for "Inguinal hernia": Patient sent for surgical consultation at the request of Dr. Kenton Kingfisher with [facility] physicians. Concern for left groin bulging and probable hernia.  Active elderly male. He comes today with his wife. They walk 5 miles a day. He loves golfing. He has been doing more lifting with a recent move. Noticed a bulge a month or 2 ago. Was concerned. Discussed with primary care physician. Discussion made about ordering an ultrasound. History and ultrasound suspicious for left inguinal hernia containing some fat. Surgical consultation requested. Patient very active. Apparently he did have some infection complications related to his right knee replacement requiring numerous operations and partial hardware removal. 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 No infection issues recently but is staying on a daily oral antibiotic suppression with doxycycline per Dr Mayer Camel. Apparently he is down to his last intervention on his right knee but remains physically active. Patient recalls having groin hernia surgery done on the same side about 25 years ago. Looks like he has incisions in both groins. He also notes some right groin discomfort occasionally as well. They are concerned about his ability travel and play golf since his groins are hurting more wished to have any possible hernias fixed.   Past Surgical History [name], [place]; 09/22/2016 9:21 AM) Colon Polyp Removal - Colonoscopy  Colon Polyp Removal - Open  Foot Surgery  Right. Knee Surgery  Bilateral. Vasectomy   Allergies [name], RMA; 09/22/2016 9:22 AM) No Known Allergies  09/22/2016  Medication History [name], [place]; 09/22/2016 9:25 AM) Doxycycline Hyclate (100MG  Tablet, Oral) Active. Tamsulosin HCl (0.4MG  Capsule, Oral) Active. Aspirin EC (325MG  Tablet DR, Oral) Active. Voltaren (1% Gel, Transdermal) Active. Doxycycline Hyclate (100MG  Capsule, Oral) Active. Multivitamins (Oral) Active. Flomax (0.4MG  Capsule, Oral) Active. Zanaflex (2MG  Capsule, Oral) Active. Valtrex (500MG  Tablet, Oral) Active. Medications Reconciled  Social History [name], [place]; 09/22/2016 9:21 AM) Caffeine use  Carbonated beverages, Coffee. No alcohol use  No drug use  Tobacco use  Never smoker.  Family History [name], [place]; 09/22/2016 9:21 AM) Arthritis  Father, Mother, Sister. Breast Cancer  Mother, Sister. Colon Polyps  Mother. Depression  Mother. Heart Disease  Father, Mother. Hypertension  Father, Mother, Sister.  Other Problems [name], RMA; 09/22/2016 9:21 AM) Colon Cancer  Inguinal Hernia  Melanoma     Review of Systems [name] RMA; 09/22/2016 9:21 AM) General Not Present- Appetite Loss, Chills, Fatigue, Fever, Night Sweats, Weight Gain and Weight Loss. Skin Not Present- Change in Wart/Mole, Dryness, Hives, Jaundice, New Lesions, Non-Healing Wounds, Rash and Ulcer. HEENT Present- Hearing Loss and Wears glasses/contact lenses. Not Present- Earache, Hoarseness, Nose Bleed, Oral Ulcers, Ringing in the Ears, Seasonal Allergies, Sinus Pain, Sore Throat, Visual Disturbances and Yellow Eyes. Respiratory Present- Snoring. Not Present- Bloody sputum, Chronic Cough, Difficulty Breathing and Wheezing. Breast Not Present- Breast Mass, Breast Pain, Nipple Discharge and Skin Changes. Cardiovascular Not Present- Chest Pain, Difficulty Breathing Lying Down, Leg Cramps, Palpitations, Rapid Heart Rate, Shortness of Breath and Swelling of Extremities. Gastrointestinal Not Present- Abdominal Pain, Bloating, Bloody Stool,  Change in Bowel Habits, Chronic diarrhea, Constipation, Difficulty Swallowing, Excessive gas, Gets full quickly at meals, Hemorrhoids, Indigestion, Nausea, Rectal Pain and Vomiting.  Male Genitourinary Present- Impotence. Not Present- Blood in Urine, Change in Urinary Stream, Frequency, Nocturia, Painful Urination, Urgency and Urine Leakage. Musculoskeletal Present- Joint Pain, Joint Stiffness and Swelling of Extremities. Not Present- Back Pain, Muscle Pain and Muscle Weakness. Psychiatric Not Present- Anxiety, Bipolar, Change in Sleep Pattern, Depression, Fearful and Frequent crying. Hematology Present- Easy Bruising. Not Present- Blood Thinners, Excessive bleeding, Gland problems, HIV and Persistent Infections.  Vitals [name] RMA; 09/22/2016 9:26 AM) 09/22/2016 9:25 AM Weight: 189.6 lb Height: 72in Body Surface Area: 2.08 m Body Mass Index: 25.71 kg/m  Temp.: 98.25F  Pulse: 56 (Regular)  BP: 110/68 (Sitting, Left Arm, Standard)       Physical Exam [name] MD; 09/22/2016 10:09 AM) General Mental Status-Alert. General Appearance-Not in acute distress, Not Sickly. Orientation-Oriented X3. Hydration-Well hydrated. Voice-Normal.  Integumentary Global Assessment Upon inspection and palpation of skin surfaces of the - Axillae: non-tender, no inflammation or ulceration, no drainage. and Distribution of scalp and body hair is normal. General Characteristics Temperature - normal warmth is noted.  Head and Neck Head-normocephalic, atraumatic with no lesions or palpable masses. Face Global Assessment - atraumatic, no absence of expression. Neck Global Assessment - no abnormal movements, no bruit auscultated on the right, no bruit auscultated on the left, no decreased range of motion, non-tender. Trachea-midline. Thyroid Gland Characteristics - non-tender.  Eye Eyeball - Left-Extraocular movements intact, No Nystagmus. Eyeball -  Right-Extraocular movements intact, No Nystagmus. Cornea - Left-No Hazy. Cornea - Right-No Hazy. Sclera/Conjunctiva - Left-No scleral icterus, No Discharge. Sclera/Conjunctiva - Right-No scleral icterus, No Discharge. Pupil - Left-Direct reaction to light normal. Pupil - Right-Direct reaction to light normal.  ENMT Ears Pinna - Left - no drainage observed, no generalized tenderness observed. Right - no drainage observed, no generalized tenderness observed. Nose and Sinuses External Inspection of the Nose - no destructive lesion observed. Inspection of the nares - Left - quiet respiration. Right - quiet respiration. Mouth and Throat Lips - Upper Lip - no fissures observed, no pallor noted. Lower Lip - no fissures observed, no pallor noted. Nasopharynx - no discharge present. Oral Cavity/Oropharynx - Tongue - no dryness observed. Oral Mucosa - no cyanosis observed. Hypopharynx - no evidence of airway distress observed.  Chest and Lung Exam Inspection Movements - Normal and Symmetrical. Accessory muscles - No use of accessory muscles in breathing. Palpation Palpation of the chest reveals - Non-tender. Auscultation Breath sounds - Normal and Clear.  Cardiovascular Auscultation Rhythm - Regular. Murmurs & Other Heart Sounds - Auscultation of the heart reveals - No Murmurs and No Systolic Clicks.  Abdomen Inspection Inspection of the abdomen reveals - No Visible peristalsis and No Abnormal pulsations. Umbilicus - No Bleeding, No Urine drainage. Palpation/Percussion Palpation and Percussion of the abdomen reveal - Soft, Non Tender, No Rebound tenderness, No Rigidity (guarding) and No Cutaneous hyperesthesia.  Male Genitourinary Sexual Maturity Tanner 5 - Adult hair pattern and Adult penile size and shape. Note: Obvious left groin bulge reducible consistent with left inguinal hernia. Incision over it suspicious for recurrent. Definite incision a right groin. Mild bulging  more medially suspicious for direct recurrent right inguinal hernia. Normal external genitalia. Epididymi, testes, and spermatic cords normal without any masses.   Peripheral Vascular Upper Extremity Inspection - Left - No Cyanotic nailbeds, Not Ischemic. Right - No Cyanotic nailbeds, Not Ischemic.  Neurologic Neurologic evaluation reveals -normal attention span and ability to concentrate, able to name objects and repeat phrases. Appropriate fund of knowledge , normal sensation and  normal coordination. Mental Status Affect - not angry, not paranoid. Cranial Nerves-Normal Bilaterally. Gait-Normal.  Neuropsychiatric Mental status exam performed with findings of-able to articulate well with normal speech/language, rate, volume and coherence, thought content normal with ability to perform basic computations and apply abstract reasoning and no evidence of hallucinations, delusions, obsessions or homicidal/suicidal ideation.  Musculoskeletal Global Assessment Spine, Ribs and Pelvis - no instability, subluxation or laxity. Right Upper Extremity - no instability, subluxation or laxity.  Lymphatic Head & Neck  General Head & Neck Lymphatics: Bilateral - Description - No Localized lymphadenopathy. Axillary  General Axillary Region: Bilateral - Description - No Localized lymphadenopathy. Femoral & Inguinal  Generalized Femoral & Inguinal Lymphatics: Left - Description - No Localized lymphadenopathy. Right - Description - No Localized lymphadenopathy.    Assessment & Plan [name] MD; 09/22/2016 10:17 AM) BILATERAL RECURRENT INGUINAL HERNIA WITHOUT OBSTRUCTION OR GANGRENE (K40.21) Impression: Definite left and probable right inguinal hernias with incisions down they're suspicious for recurrent hernias.  Standard of care would be mesh repair to lower recurrence. With his problems with hardware infection due to staph exposure, we will try and use Phasix biologic mesh instead to  lower potential infection issues. Discuss with my partner, Dr. Rosendo Gros. PREOP - ING HERNIA - ENCOUNTER FOR PREOPERATIVE EXAMINATION FOR GENERAL SURGICAL PROCEDURE (Z01.818) Current Plans You are being scheduled for surgery- Our schedulers will call you.  You should hear from our office's scheduling department within 5 working days about the location, date, and time of surgery. We try to make accommodations for patient's preferences in scheduling surgery, but sometimes the OR schedule or the surgeon's schedule prevents [place] from making those accommodations.  If you have not heard from our office [phone]) in 5 working days, call the office and ask for your surgeon's nurse.  If you have other questions about your diagnosis, plan, or surgery, call the office and ask for your surgeon's nurse.  Written instructions provided The anatomy & physiology of the abdominal wall and pelvic floor was discussed. The pathophysiology of hernias in the inguinal and pelvic region was discussed. Natural history risks such as progressive enlargement, pain, incarceration, and strangulation was discussed. Contributors to complications such as smoking, obesity, diabetes, prior surgery, etc were discussed.  I feel the risks of no intervention will lead to serious problems that outweigh the operative risks; therefore, I recommended surgery to reduce and repair the hernia. I explained laparoscopic techniques with possible need for an open approach. I noted usual use of mesh to patch and/or buttress hernia repair  Risks such as bleeding, infection, abscess, need for further treatment, heart attack, death, and other risks were discussed. I noted a good likelihood this will help address the problem. Goals of post-operative recovery were discussed as well. Possibility that this will not correct all symptoms was explained. I stressed the importance of low-impact activity, aggressive pain control, avoiding  constipation, & not pushing through pain to minimize risk of post-operative chronic pain or injury. Possibility of reherniation was discussed. We will work to minimize complications.  An educational handout further explaining the pathology & treatment options was given as well. Questions were answered. The patient expresses understanding & wishes to proceed with surgery.  Pt Education - Pamphlet Given - Laparoscopic Hernia Repair: discussed with patient and provided information. Pt Education - [facility] Pain Control (Zaila Crew) Pt Education - [facility] Hernia Post-Op HCI (Laquiesha Piacente): discussed with patient and provided information.    [name], M.D., F.A.C.S. Gastrointestinal and Minimally Invasive Surgery  [facility] [phone] [address], [place], [place] [address] [phone] Main / Paging

## 2016-10-31 DIAGNOSIS — K4021 Bilateral inguinal hernia, without obstruction or gangrene, recurrent: Secondary | ICD-10-CM | POA: Diagnosis not present

## 2016-10-31 DIAGNOSIS — D176 Benign lipomatous neoplasm of spermatic cord: Secondary | ICD-10-CM | POA: Diagnosis not present

## 2016-10-31 DIAGNOSIS — K4020 Bilateral inguinal hernia, without obstruction or gangrene, not specified as recurrent: Secondary | ICD-10-CM | POA: Diagnosis not present

## 2016-10-31 DIAGNOSIS — K4190 Unilateral femoral hernia, without obstruction or gangrene, not specified as recurrent: Secondary | ICD-10-CM | POA: Diagnosis not present

## 2017-03-31 DIAGNOSIS — H25093 Other age-related incipient cataract, bilateral: Secondary | ICD-10-CM | POA: Diagnosis not present

## 2017-03-31 DIAGNOSIS — D3132 Benign neoplasm of left choroid: Secondary | ICD-10-CM | POA: Diagnosis not present

## 2017-05-07 DIAGNOSIS — Z23 Encounter for immunization: Secondary | ICD-10-CM | POA: Diagnosis not present

## 2017-09-28 DIAGNOSIS — E7849 Other hyperlipidemia: Secondary | ICD-10-CM | POA: Diagnosis not present

## 2017-09-28 DIAGNOSIS — R0602 Shortness of breath: Secondary | ICD-10-CM | POA: Diagnosis not present

## 2017-09-28 DIAGNOSIS — Z01810 Encounter for preprocedural cardiovascular examination: Secondary | ICD-10-CM | POA: Diagnosis not present

## 2017-09-28 DIAGNOSIS — I2510 Atherosclerotic heart disease of native coronary artery without angina pectoris: Secondary | ICD-10-CM | POA: Diagnosis not present

## 2017-09-28 DIAGNOSIS — E785 Hyperlipidemia, unspecified: Secondary | ICD-10-CM | POA: Diagnosis not present

## 2017-10-02 DIAGNOSIS — R0609 Other forms of dyspnea: Secondary | ICD-10-CM | POA: Diagnosis not present

## 2017-10-27 DIAGNOSIS — M818 Other osteoporosis without current pathological fracture: Secondary | ICD-10-CM | POA: Diagnosis not present

## 2017-10-27 DIAGNOSIS — N400 Enlarged prostate without lower urinary tract symptoms: Secondary | ICD-10-CM | POA: Diagnosis not present

## 2017-10-27 DIAGNOSIS — I2510 Atherosclerotic heart disease of native coronary artery without angina pectoris: Secondary | ICD-10-CM | POA: Diagnosis not present

## 2017-10-27 DIAGNOSIS — Z1389 Encounter for screening for other disorder: Secondary | ICD-10-CM | POA: Diagnosis not present

## 2017-10-27 DIAGNOSIS — Z125 Encounter for screening for malignant neoplasm of prostate: Secondary | ICD-10-CM | POA: Diagnosis not present

## 2017-10-27 DIAGNOSIS — N529 Male erectile dysfunction, unspecified: Secondary | ICD-10-CM | POA: Diagnosis not present

## 2017-10-27 DIAGNOSIS — M170 Bilateral primary osteoarthritis of knee: Secondary | ICD-10-CM | POA: Diagnosis not present

## 2017-10-28 DIAGNOSIS — M19042 Primary osteoarthritis, left hand: Secondary | ICD-10-CM | POA: Diagnosis not present

## 2017-11-10 DIAGNOSIS — M79672 Pain in left foot: Secondary | ICD-10-CM | POA: Diagnosis not present

## 2017-11-18 DIAGNOSIS — M7672 Peroneal tendinitis, left leg: Secondary | ICD-10-CM | POA: Diagnosis not present

## 2017-11-18 DIAGNOSIS — M79671 Pain in right foot: Secondary | ICD-10-CM | POA: Diagnosis not present

## 2017-11-18 DIAGNOSIS — M79672 Pain in left foot: Secondary | ICD-10-CM | POA: Diagnosis not present

## 2018-03-18 DIAGNOSIS — M25561 Pain in right knee: Secondary | ICD-10-CM | POA: Diagnosis not present

## 2018-03-23 DIAGNOSIS — R5383 Other fatigue: Secondary | ICD-10-CM | POA: Diagnosis not present

## 2018-03-23 DIAGNOSIS — S8010XA Contusion of unspecified lower leg, initial encounter: Secondary | ICD-10-CM | POA: Diagnosis not present

## 2018-03-23 DIAGNOSIS — Z862 Personal history of diseases of the blood and blood-forming organs and certain disorders involving the immune mechanism: Secondary | ICD-10-CM | POA: Diagnosis not present

## 2018-03-23 DIAGNOSIS — R202 Paresthesia of skin: Secondary | ICD-10-CM | POA: Diagnosis not present

## 2018-08-01 DIAGNOSIS — Z23 Encounter for immunization: Secondary | ICD-10-CM | POA: Diagnosis not present

## 2018-08-16 DIAGNOSIS — D3132 Benign neoplasm of left choroid: Secondary | ICD-10-CM | POA: Diagnosis not present

## 2018-08-16 DIAGNOSIS — H25093 Other age-related incipient cataract, bilateral: Secondary | ICD-10-CM | POA: Diagnosis not present

## 2018-09-21 ENCOUNTER — Encounter: Payer: Self-pay | Admitting: Cardiology

## 2018-09-21 ENCOUNTER — Ambulatory Visit (INDEPENDENT_AMBULATORY_CARE_PROVIDER_SITE_OTHER): Payer: Medicare Other | Admitting: Cardiology

## 2018-09-21 DIAGNOSIS — I340 Nonrheumatic mitral (valve) insufficiency: Secondary | ICD-10-CM | POA: Diagnosis not present

## 2018-09-21 DIAGNOSIS — I350 Nonrheumatic aortic (valve) stenosis: Secondary | ICD-10-CM | POA: Insufficient documentation

## 2018-09-21 NOTE — Progress Notes (Signed)
Cardiology Office Note:    Date:  09/21/2018   ID:  Gerald Spears, DOB 12/17/1940, MRN [id number]  PCP:  [name], MD  Cardiologist:  Jenean Lindau, MD   Referring MD: [name], MD    ASSESSMENT:    1. Aortic valve stenosis, etiology of cardiac valve disease unspecified   2. Mitral valve insufficiency, unspecified etiology    PLAN:    In order of problems listed above:  1. I discussed my findings with the patient at extensive length.  Echocardiogram report was discussed with the patient.  Findings were discussed and questions were answered to their satisfaction.  His blood pressure is stable.  We will obtain an echocardiogram to assess valvular issues 2. Patient will be seen in follow-up appointment in 12 months or earlier if the patient has any concerns.  Lipids are followed by primary care physician.    Medication Adjustments/Labs and Tests Ordered: Current medicines are reviewed at length with the patient today.  Concerns regarding medicines are outlined above.  No orders of the defined types were placed in this encounter.  No orders of the defined types were placed in this encounter.    No chief complaint on file.    History of Present Illness:    Gerald Spears is a 78 y.o. male.  Patient is a very pleasant gentleman.  He has history of aortic stenosis and mitral and tricuspid regurgitation.  He denies any problems at this time and takes care of activities of daily living.  No chest pain orthopnea or PND.  He is here for a routine annual visit.  At the time of my evaluation, the patient is alert awake oriented and in no distress.  Past Medical History:  Diagnosis Date  . Cancer [facility])    colon polyp  . Complication of anesthesia    difficult to wake up after surgery  . DJD (degenerative joint disease)    Dr. Noemi Chapel end stages of DJD 2011  . Hard of hearing   . Urinary frequency   . UTI (lower urinary tract infection)     Past Surgical  History:  Procedure Laterality Date  . ANKLE SURGERY Right   . COLONOSCOPY W/ BIOPSIES AND POLYPECTOMY    . ELBOW SURGERY Right   . EYE SURGERY    . HERNIA REPAIR    . JOINT REPLACEMENT    . KNEE SURGERY Right    x4  . TOTAL KNEE ARTHROPLASTY Right   . TOTAL KNEE ARTHROPLASTY Left 06/18/2015   Procedure: TOTAL KNEE ARTHROPLASTY;  Surgeon: Frederik Pear, MD;  Location: [facility];  Service: Orthopedics;  Laterality: Left;    Current Medications: Current Meds  Medication Sig  . doxycycline (VIBRA-TABS) 100 MG tablet Take 100 mg by mouth 2 (two) times daily.  Marland Kitchen OVER THE COUNTER MEDICATION Take 1 tablet by mouth daily. Instaflex advanced.  . Tamsulosin HCl (FLOMAX) 0.4 MG CAPS Take 0.4 mg by mouth daily.  . valACYclovir (VALTREX) 500 MG tablet Take 500 mg by mouth 2 (two) times daily as needed (Cold sore).   . vitamin B-12 (CYANOCOBALAMIN) 100 MCG tablet Take 100 mcg by mouth daily.     Allergies:   Patient has no known allergies.   Social History   Socioeconomic History  . Marital status: Married    Spouse name: Not on file  . Number of children: Not on file  . Years of education: Not on file  . Highest education level: Not on file  Occupational History  . Not on file  Social Needs  . Financial resource strain: Not on file  . Food insecurity:    Worry: Not on file    Inability: Not on file  . Transportation needs:    Medical: Not on file    Non-medical: Not on file  Tobacco Use  . Smoking status: Never Smoker  . Smokeless tobacco: Never Used  Substance and Sexual Activity  . Alcohol use: No  . Drug use: No  . Sexual activity: Not on file  Lifestyle  . Physical activity:    Days per week: Not on file    Minutes per session: Not on file  . Stress: Not on file  Relationships  . Social connections:    Talks on phone: Not on file    Gets together: Not on file    Attends religious service: Not on file    Active member of club or organization: Not on file    Attends  meetings of clubs or organizations: Not on file    Relationship status: Not on file  Other Topics Concern  . Not on file  Social History Narrative  . Not on file     Family History: The patient's family history includes Breast cancer in his sister and sister; Heart attack in his mother; Heart disease in his father.  ROS:   Please see the history of present illness.    All other systems reviewed and are negative.  EKGs/Labs/Other Studies Reviewed:    The following studies were reviewed today: I discussed my findings with the patient at extensive length.   Recent Labs: No results found for requested labs within last 8760 hours.  Recent Lipid Panel No results found for: CHOL, TRIG, HDL, CHOLHDL, VLDL, LDLCALC, LDLDIRECT  Physical Exam:    VS:  BP 122/60 (BP Location: Right Arm, Patient Position: Sitting, Cuff Size: Normal)   Pulse (!) 57   Ht 6' (1.829 m)   Wt 190 lb (86.2 kg)   SpO2 98%   BMI 25.77 kg/m     Wt Readings from Last 3 Encounters:  09/21/18 190 lb (86.2 kg)  06/18/15 190 lb (86.2 kg)  06/07/15 190 lb (86.2 kg)     GEN: Patient is in no acute distress HEENT: Normal NECK: No JVD; No carotid bruits LYMPHATICS: No lymphadenopathy CARDIAC: Hear sounds regular, 2/6 systolic murmur at the apex and aortic area. RESPIRATORY:  Clear to auscultation without rales, wheezing or rhonchi  ABDOMEN: Soft, non-tender, non-distended MUSCULOSKELETAL:  No edema; No deformity  SKIN: Warm and dry NEUROLOGIC:  Alert and oriented x 3 PSYCHIATRIC:  Normal affect   Signed, Jenean Lindau, MD  09/21/2018 4:07 PM    [facility]

## 2018-09-21 NOTE — Patient Instructions (Signed)
Medication Instructions:  Your physician recommends that you continue on your current medications as directed. Please refer to the Current Medication list given to you today.  If you need a refill on your cardiac medications before your next appointment, please call your pharmacy.   Lab work: None  If you have labs (blood work) drawn today and your tests are completely normal, you will receive your results only by: . MyChart Message (if you have MyChart) OR . A paper copy in the mail If you have any lab test that is abnormal or we need to change your treatment, we will call you to review the results.  Testing/Procedures: You had an EKG today.   Your physician has requested that you have an echocardiogram. Echocardiography is a painless test that uses sound waves to create images of your heart. It provides your doctor with information about the size and shape of your heart and how well your heart's chambers and valves are working. This procedure takes approximately one hour. There are no restrictions for this procedure.  Follow-Up: At [facility], you and your health needs are our priority.  As part of our continuing mission to provide you with exceptional heart care, we have created designated Provider Care Teams.  These Care Teams include your primary Cardiologist (physician) and Advanced Practice Providers (APPs -  Physician Assistants and Nurse Practitioners) who all work together to provide you with the care you need, when you need it. You will need a follow up appointment in 1 years.  Please call our office 2 months in advance to schedule this appointment.       Echocardiogram An echocardiogram is a procedure that uses painless sound waves (ultrasound) to produce an image of the heart. Images from an echocardiogram can provide important information about:  Signs of coronary artery disease (CAD).  Aneurysm detection. An aneurysm is a weak or damaged part of an artery wall that  bulges out from the normal force of blood pumping through the body.  Heart size and shape. Changes in the size or shape of the heart can be associated with certain conditions, including heart failure, aneurysm, and CAD.  Heart muscle function.  Heart valve function.  Signs of a past heart attack.  Fluid buildup around the heart.  Thickening of the heart muscle.  A tumor or infectious growth around the heart valves. Tell a health care provider about:  Any allergies you have.  All medicines you are taking, including vitamins, herbs, eye drops, creams, and over-the-counter medicines.  Any blood disorders you have.  Any surgeries you have had.  Any medical conditions you have.  Whether you are pregnant or may be pregnant. What are the risks? Generally, this is a safe procedure. However, problems may occur, including:  Allergic reaction to dye (contrast) that may be used during the procedure. What happens before the procedure? No specific preparation is needed. You may eat and drink normally. What happens during the procedure?   An IV tube may be inserted into one of your veins.  You may receive contrast through this tube. A contrast is an injection that improves the quality of the pictures from your heart.  A gel will be applied to your chest.  A wand-like tool (transducer) will be moved over your chest. The gel will help to transmit the sound waves from the transducer.  The sound waves will harmlessly bounce off of your heart to allow the heart images to be captured in real-time motion. The   images will be recorded on a computer. The procedure may vary among health care providers and hospitals. What happens after the procedure?  You may return to your normal, everyday life, including diet, activities, and medicines, unless your health care provider tells you not to do that. Summary  An echocardiogram is a procedure that uses painless sound waves (ultrasound) to produce  an image of the heart.  Images from an echocardiogram can provide important information about the size and shape of your heart, heart muscle function, heart valve function, and fluid buildup around your heart.  You do not need to do anything to prepare before this procedure. You may eat and drink normally.  After the echocardiogram is completed, you may return to your normal, everyday life, unless your health care provider tells you not to do that. This information is not intended to replace advice given to you by your health care provider. Make sure you discuss any questions you have with your health care provider. Document Released: 08/01/2000 Document Revised: 09/06/2016 Document Reviewed: 09/06/2016 Elsevier Interactive Patient Education  2019 [employer]vier Inc.    

## 2018-09-27 ENCOUNTER — Ambulatory Visit (HOSPITAL_COMMUNITY): Payer: Medicare Other | Attending: Cardiology

## 2018-09-27 DIAGNOSIS — I350 Nonrheumatic aortic (valve) stenosis: Secondary | ICD-10-CM | POA: Diagnosis not present

## 2018-10-12 DIAGNOSIS — M25561 Pain in right knee: Secondary | ICD-10-CM | POA: Diagnosis not present

## 2018-10-14 DIAGNOSIS — M25561 Pain in right knee: Secondary | ICD-10-CM | POA: Diagnosis not present

## 2018-10-18 DIAGNOSIS — M25561 Pain in right knee: Secondary | ICD-10-CM | POA: Diagnosis not present

## 2018-10-18 DIAGNOSIS — Z96651 Presence of right artificial knee joint: Secondary | ICD-10-CM | POA: Diagnosis not present

## 2018-10-18 DIAGNOSIS — M25661 Stiffness of right knee, not elsewhere classified: Secondary | ICD-10-CM | POA: Diagnosis not present

## 2018-10-18 DIAGNOSIS — M6281 Muscle weakness (generalized): Secondary | ICD-10-CM | POA: Diagnosis not present

## 2018-10-19 DIAGNOSIS — Z96651 Presence of right artificial knee joint: Secondary | ICD-10-CM | POA: Diagnosis not present

## 2018-10-19 DIAGNOSIS — M25661 Stiffness of right knee, not elsewhere classified: Secondary | ICD-10-CM | POA: Diagnosis not present

## 2018-10-19 DIAGNOSIS — M6281 Muscle weakness (generalized): Secondary | ICD-10-CM | POA: Diagnosis not present

## 2018-10-19 DIAGNOSIS — M25561 Pain in right knee: Secondary | ICD-10-CM | POA: Diagnosis not present

## 2018-10-22 DIAGNOSIS — M25561 Pain in right knee: Secondary | ICD-10-CM | POA: Diagnosis not present

## 2018-10-22 DIAGNOSIS — M25661 Stiffness of right knee, not elsewhere classified: Secondary | ICD-10-CM | POA: Diagnosis not present

## 2018-10-22 DIAGNOSIS — Z96651 Presence of right artificial knee joint: Secondary | ICD-10-CM | POA: Diagnosis not present

## 2018-10-22 DIAGNOSIS — M6281 Muscle weakness (generalized): Secondary | ICD-10-CM | POA: Diagnosis not present

## 2018-10-25 DIAGNOSIS — M6281 Muscle weakness (generalized): Secondary | ICD-10-CM | POA: Diagnosis not present

## 2018-10-25 DIAGNOSIS — Z96651 Presence of right artificial knee joint: Secondary | ICD-10-CM | POA: Diagnosis not present

## 2018-10-25 DIAGNOSIS — M25561 Pain in right knee: Secondary | ICD-10-CM | POA: Diagnosis not present

## 2018-10-25 DIAGNOSIS — M25661 Stiffness of right knee, not elsewhere classified: Secondary | ICD-10-CM | POA: Diagnosis not present

## 2018-10-26 DIAGNOSIS — M25561 Pain in right knee: Secondary | ICD-10-CM | POA: Diagnosis not present

## 2018-10-26 DIAGNOSIS — M6281 Muscle weakness (generalized): Secondary | ICD-10-CM | POA: Diagnosis not present

## 2018-10-26 DIAGNOSIS — M25661 Stiffness of right knee, not elsewhere classified: Secondary | ICD-10-CM | POA: Diagnosis not present

## 2018-10-26 DIAGNOSIS — Z96651 Presence of right artificial knee joint: Secondary | ICD-10-CM | POA: Diagnosis not present

## 2018-10-28 DIAGNOSIS — M170 Bilateral primary osteoarthritis of knee: Secondary | ICD-10-CM | POA: Diagnosis not present

## 2018-10-28 DIAGNOSIS — E538 Deficiency of other specified B group vitamins: Secondary | ICD-10-CM | POA: Diagnosis not present

## 2018-10-28 DIAGNOSIS — Z0000 Encounter for general adult medical examination without abnormal findings: Secondary | ICD-10-CM | POA: Diagnosis not present

## 2018-10-28 DIAGNOSIS — E7800 Pure hypercholesterolemia, unspecified: Secondary | ICD-10-CM | POA: Diagnosis not present

## 2018-10-28 DIAGNOSIS — E559 Vitamin D deficiency, unspecified: Secondary | ICD-10-CM | POA: Diagnosis not present

## 2018-10-28 DIAGNOSIS — I2510 Atherosclerotic heart disease of native coronary artery without angina pectoris: Secondary | ICD-10-CM | POA: Diagnosis not present

## 2018-10-28 DIAGNOSIS — N400 Enlarged prostate without lower urinary tract symptoms: Secondary | ICD-10-CM | POA: Diagnosis not present

## 2018-11-04 DIAGNOSIS — M25561 Pain in right knee: Secondary | ICD-10-CM | POA: Diagnosis not present

## 2018-11-04 DIAGNOSIS — Z96651 Presence of right artificial knee joint: Secondary | ICD-10-CM | POA: Diagnosis not present

## 2018-11-04 DIAGNOSIS — M6281 Muscle weakness (generalized): Secondary | ICD-10-CM | POA: Diagnosis not present

## 2018-11-04 DIAGNOSIS — M25661 Stiffness of right knee, not elsewhere classified: Secondary | ICD-10-CM | POA: Diagnosis not present

## 2018-11-08 DIAGNOSIS — M6281 Muscle weakness (generalized): Secondary | ICD-10-CM | POA: Diagnosis not present

## 2018-11-08 DIAGNOSIS — Z96651 Presence of right artificial knee joint: Secondary | ICD-10-CM | POA: Diagnosis not present

## 2018-11-08 DIAGNOSIS — M25561 Pain in right knee: Secondary | ICD-10-CM | POA: Diagnosis not present

## 2018-11-08 DIAGNOSIS — M25661 Stiffness of right knee, not elsewhere classified: Secondary | ICD-10-CM | POA: Diagnosis not present

## 2018-11-10 DIAGNOSIS — M6281 Muscle weakness (generalized): Secondary | ICD-10-CM | POA: Diagnosis not present

## 2018-11-10 DIAGNOSIS — M25661 Stiffness of right knee, not elsewhere classified: Secondary | ICD-10-CM | POA: Diagnosis not present

## 2018-11-10 DIAGNOSIS — M25561 Pain in right knee: Secondary | ICD-10-CM | POA: Diagnosis not present

## 2018-11-10 DIAGNOSIS — Z96651 Presence of right artificial knee joint: Secondary | ICD-10-CM | POA: Diagnosis not present

## 2018-11-12 DIAGNOSIS — Z96651 Presence of right artificial knee joint: Secondary | ICD-10-CM | POA: Diagnosis not present

## 2018-11-12 DIAGNOSIS — M25561 Pain in right knee: Secondary | ICD-10-CM | POA: Diagnosis not present

## 2018-11-12 DIAGNOSIS — M25661 Stiffness of right knee, not elsewhere classified: Secondary | ICD-10-CM | POA: Diagnosis not present

## 2018-11-12 DIAGNOSIS — M6281 Muscle weakness (generalized): Secondary | ICD-10-CM | POA: Diagnosis not present

## 2018-11-15 DIAGNOSIS — M6281 Muscle weakness (generalized): Secondary | ICD-10-CM | POA: Diagnosis not present

## 2018-11-15 DIAGNOSIS — M25561 Pain in right knee: Secondary | ICD-10-CM | POA: Diagnosis not present

## 2018-11-15 DIAGNOSIS — M25661 Stiffness of right knee, not elsewhere classified: Secondary | ICD-10-CM | POA: Diagnosis not present

## 2018-11-15 DIAGNOSIS — Z96651 Presence of right artificial knee joint: Secondary | ICD-10-CM | POA: Diagnosis not present

## 2018-12-06 DIAGNOSIS — M25561 Pain in right knee: Secondary | ICD-10-CM | POA: Diagnosis not present

## 2018-12-06 DIAGNOSIS — M6281 Muscle weakness (generalized): Secondary | ICD-10-CM | POA: Diagnosis not present

## 2018-12-06 DIAGNOSIS — Z96651 Presence of right artificial knee joint: Secondary | ICD-10-CM | POA: Diagnosis not present

## 2018-12-06 DIAGNOSIS — M25661 Stiffness of right knee, not elsewhere classified: Secondary | ICD-10-CM | POA: Diagnosis not present

## 2018-12-07 DIAGNOSIS — M25561 Pain in right knee: Secondary | ICD-10-CM | POA: Diagnosis not present

## 2018-12-13 DIAGNOSIS — Z96651 Presence of right artificial knee joint: Secondary | ICD-10-CM | POA: Diagnosis not present

## 2018-12-13 DIAGNOSIS — M25561 Pain in right knee: Secondary | ICD-10-CM | POA: Diagnosis not present

## 2018-12-13 DIAGNOSIS — M25661 Stiffness of right knee, not elsewhere classified: Secondary | ICD-10-CM | POA: Diagnosis not present

## 2018-12-13 DIAGNOSIS — M6281 Muscle weakness (generalized): Secondary | ICD-10-CM | POA: Diagnosis not present

## 2018-12-16 DIAGNOSIS — Z96651 Presence of right artificial knee joint: Secondary | ICD-10-CM | POA: Diagnosis not present

## 2018-12-16 DIAGNOSIS — M25661 Stiffness of right knee, not elsewhere classified: Secondary | ICD-10-CM | POA: Diagnosis not present

## 2018-12-16 DIAGNOSIS — M6281 Muscle weakness (generalized): Secondary | ICD-10-CM | POA: Diagnosis not present

## 2018-12-16 DIAGNOSIS — M25561 Pain in right knee: Secondary | ICD-10-CM | POA: Diagnosis not present

## 2018-12-20 DIAGNOSIS — M25561 Pain in right knee: Secondary | ICD-10-CM | POA: Diagnosis not present

## 2018-12-20 DIAGNOSIS — M6281 Muscle weakness (generalized): Secondary | ICD-10-CM | POA: Diagnosis not present

## 2018-12-20 DIAGNOSIS — M25661 Stiffness of right knee, not elsewhere classified: Secondary | ICD-10-CM | POA: Diagnosis not present

## 2018-12-20 DIAGNOSIS — Z96651 Presence of right artificial knee joint: Secondary | ICD-10-CM | POA: Diagnosis not present

## 2018-12-23 DIAGNOSIS — M25661 Stiffness of right knee, not elsewhere classified: Secondary | ICD-10-CM | POA: Diagnosis not present

## 2018-12-23 DIAGNOSIS — M25561 Pain in right knee: Secondary | ICD-10-CM | POA: Diagnosis not present

## 2018-12-23 DIAGNOSIS — M6281 Muscle weakness (generalized): Secondary | ICD-10-CM | POA: Diagnosis not present

## 2018-12-23 DIAGNOSIS — Z96651 Presence of right artificial knee joint: Secondary | ICD-10-CM | POA: Diagnosis not present

## 2018-12-27 DIAGNOSIS — M6281 Muscle weakness (generalized): Secondary | ICD-10-CM | POA: Diagnosis not present

## 2018-12-27 DIAGNOSIS — M25561 Pain in right knee: Secondary | ICD-10-CM | POA: Diagnosis not present

## 2018-12-27 DIAGNOSIS — M25661 Stiffness of right knee, not elsewhere classified: Secondary | ICD-10-CM | POA: Diagnosis not present

## 2018-12-27 DIAGNOSIS — Z96651 Presence of right artificial knee joint: Secondary | ICD-10-CM | POA: Diagnosis not present

## 2018-12-30 DIAGNOSIS — M25561 Pain in right knee: Secondary | ICD-10-CM | POA: Diagnosis not present

## 2018-12-30 DIAGNOSIS — Z96651 Presence of right artificial knee joint: Secondary | ICD-10-CM | POA: Diagnosis not present

## 2018-12-30 DIAGNOSIS — M6281 Muscle weakness (generalized): Secondary | ICD-10-CM | POA: Diagnosis not present

## 2018-12-30 DIAGNOSIS — M25661 Stiffness of right knee, not elsewhere classified: Secondary | ICD-10-CM | POA: Diagnosis not present

## 2019-01-03 DIAGNOSIS — M25661 Stiffness of right knee, not elsewhere classified: Secondary | ICD-10-CM | POA: Diagnosis not present

## 2019-01-03 DIAGNOSIS — M6281 Muscle weakness (generalized): Secondary | ICD-10-CM | POA: Diagnosis not present

## 2019-01-03 DIAGNOSIS — Z96651 Presence of right artificial knee joint: Secondary | ICD-10-CM | POA: Diagnosis not present

## 2019-01-03 DIAGNOSIS — M25561 Pain in right knee: Secondary | ICD-10-CM | POA: Diagnosis not present

## 2019-01-06 DIAGNOSIS — M6281 Muscle weakness (generalized): Secondary | ICD-10-CM | POA: Diagnosis not present

## 2019-01-06 DIAGNOSIS — Z96651 Presence of right artificial knee joint: Secondary | ICD-10-CM | POA: Diagnosis not present

## 2019-01-06 DIAGNOSIS — M25661 Stiffness of right knee, not elsewhere classified: Secondary | ICD-10-CM | POA: Diagnosis not present

## 2019-01-06 DIAGNOSIS — M25561 Pain in right knee: Secondary | ICD-10-CM | POA: Diagnosis not present

## 2019-01-13 DIAGNOSIS — Z96651 Presence of right artificial knee joint: Secondary | ICD-10-CM | POA: Diagnosis not present

## 2019-01-13 DIAGNOSIS — M25661 Stiffness of right knee, not elsewhere classified: Secondary | ICD-10-CM | POA: Diagnosis not present

## 2019-01-13 DIAGNOSIS — M25561 Pain in right knee: Secondary | ICD-10-CM | POA: Diagnosis not present

## 2019-01-13 DIAGNOSIS — M6281 Muscle weakness (generalized): Secondary | ICD-10-CM | POA: Diagnosis not present

## 2019-02-25 DIAGNOSIS — D229 Melanocytic nevi, unspecified: Secondary | ICD-10-CM | POA: Diagnosis not present

## 2019-02-25 DIAGNOSIS — L820 Inflamed seborrheic keratosis: Secondary | ICD-10-CM | POA: Diagnosis not present

## 2019-02-25 DIAGNOSIS — Z85820 Personal history of malignant melanoma of skin: Secondary | ICD-10-CM | POA: Diagnosis not present

## 2019-03-28 DIAGNOSIS — H9202 Otalgia, left ear: Secondary | ICD-10-CM | POA: Diagnosis not present

## 2019-03-28 DIAGNOSIS — R42 Dizziness and giddiness: Secondary | ICD-10-CM | POA: Diagnosis not present

## 2019-05-17 DIAGNOSIS — Z23 Encounter for immunization: Secondary | ICD-10-CM | POA: Diagnosis not present

## 2019-09-13 ENCOUNTER — Ambulatory Visit: Payer: Medicare Other

## 2019-09-22 ENCOUNTER — Ambulatory Visit: Payer: Medicare Other

## 2019-10-04 ENCOUNTER — Ambulatory Visit: Payer: Medicare Other

## 2019-11-09 DIAGNOSIS — N400 Enlarged prostate without lower urinary tract symptoms: Secondary | ICD-10-CM | POA: Diagnosis not present

## 2019-11-09 DIAGNOSIS — I2510 Atherosclerotic heart disease of native coronary artery without angina pectoris: Secondary | ICD-10-CM | POA: Diagnosis not present

## 2019-11-09 DIAGNOSIS — E538 Deficiency of other specified B group vitamins: Secondary | ICD-10-CM | POA: Diagnosis not present

## 2019-11-09 DIAGNOSIS — E7800 Pure hypercholesterolemia, unspecified: Secondary | ICD-10-CM | POA: Diagnosis not present

## 2019-11-09 DIAGNOSIS — M170 Bilateral primary osteoarthritis of knee: Secondary | ICD-10-CM | POA: Diagnosis not present

## 2019-11-09 DIAGNOSIS — E559 Vitamin D deficiency, unspecified: Secondary | ICD-10-CM | POA: Diagnosis not present

## 2019-11-09 DIAGNOSIS — Z0000 Encounter for general adult medical examination without abnormal findings: Secondary | ICD-10-CM | POA: Diagnosis not present

## 2019-11-15 DIAGNOSIS — Z87891 Personal history of nicotine dependence: Secondary | ICD-10-CM | POA: Diagnosis not present

## 2019-11-15 DIAGNOSIS — L570 Actinic keratosis: Secondary | ICD-10-CM | POA: Diagnosis not present

## 2019-11-15 DIAGNOSIS — D229 Melanocytic nevi, unspecified: Secondary | ICD-10-CM | POA: Diagnosis not present

## 2019-11-15 DIAGNOSIS — Z85820 Personal history of malignant melanoma of skin: Secondary | ICD-10-CM | POA: Diagnosis not present

## 2020-04-04 DIAGNOSIS — Z86010 Personal history of colonic polyps: Secondary | ICD-10-CM | POA: Diagnosis not present

## 2020-04-04 DIAGNOSIS — D122 Benign neoplasm of ascending colon: Secondary | ICD-10-CM | POA: Diagnosis not present

## 2020-04-04 DIAGNOSIS — K640 First degree hemorrhoids: Secondary | ICD-10-CM | POA: Diagnosis not present

## 2020-04-04 DIAGNOSIS — D124 Benign neoplasm of descending colon: Secondary | ICD-10-CM | POA: Diagnosis not present

## 2020-04-06 DIAGNOSIS — D124 Benign neoplasm of descending colon: Secondary | ICD-10-CM | POA: Diagnosis not present

## 2020-04-06 DIAGNOSIS — D122 Benign neoplasm of ascending colon: Secondary | ICD-10-CM | POA: Diagnosis not present

## 2020-04-16 DIAGNOSIS — R310 Gross hematuria: Secondary | ICD-10-CM | POA: Diagnosis not present

## 2020-04-16 DIAGNOSIS — R3915 Urgency of urination: Secondary | ICD-10-CM | POA: Diagnosis not present

## 2020-04-16 DIAGNOSIS — R3911 Hesitancy of micturition: Secondary | ICD-10-CM | POA: Diagnosis not present

## 2020-04-16 DIAGNOSIS — N401 Enlarged prostate with lower urinary tract symptoms: Secondary | ICD-10-CM | POA: Diagnosis not present

## 2020-04-16 DIAGNOSIS — R3912 Poor urinary stream: Secondary | ICD-10-CM | POA: Diagnosis not present

## 2020-04-26 DIAGNOSIS — N281 Cyst of kidney, acquired: Secondary | ICD-10-CM | POA: Diagnosis not present

## 2020-04-26 DIAGNOSIS — K4090 Unilateral inguinal hernia, without obstruction or gangrene, not specified as recurrent: Secondary | ICD-10-CM | POA: Diagnosis not present

## 2020-04-26 DIAGNOSIS — I700 Atherosclerosis of aorta: Secondary | ICD-10-CM | POA: Diagnosis not present

## 2020-04-26 DIAGNOSIS — R310 Gross hematuria: Secondary | ICD-10-CM | POA: Diagnosis not present

## 2020-05-14 DIAGNOSIS — N401 Enlarged prostate with lower urinary tract symptoms: Secondary | ICD-10-CM | POA: Diagnosis not present

## 2020-05-14 DIAGNOSIS — R310 Gross hematuria: Secondary | ICD-10-CM | POA: Diagnosis not present

## 2020-05-14 DIAGNOSIS — R3915 Urgency of urination: Secondary | ICD-10-CM | POA: Diagnosis not present

## 2020-05-14 DIAGNOSIS — R3912 Poor urinary stream: Secondary | ICD-10-CM | POA: Diagnosis not present

## 2020-06-14 DIAGNOSIS — Z23 Encounter for immunization: Secondary | ICD-10-CM | POA: Diagnosis not present

## 2020-08-03 ENCOUNTER — Telehealth: Payer: Self-pay | Admitting: Cardiology

## 2020-08-03 NOTE — Telephone Encounter (Signed)
Fine with me.   [place] T. Audie Box, [place]  [address] [place],  [address] [phone]  1:16 PM

## 2020-08-03 NOTE — Telephone Encounter (Signed)
ok 

## 2020-08-03 NOTE — Telephone Encounter (Signed)
  Patient would like to switch from Dr Geraldo Pitter to Dr Audie Box. Please advise

## 2020-08-22 NOTE — Progress Notes (Signed)
Cardiology Office Note:   Date:  08/23/2020  NAME:  Gerald Spears    MRN: [id number] DOB:  Sep 27, 1940   PCP:  Johny Blamer, MD  Cardiologist:  Reatha Harps, MD   Referring MD: Johny Blamer, MD   Chief Complaint  Patient presents with  . Follow-up       . Fatigue  . Headache   History of Present Illness:   Gerald Spears is a 80 y.o. male with a hx of aortic stenosis, mitral regurgitation, DJD who presents for follow-up. Was followed in [facility]. Echo last year with mild AS and moderate MR. He reports the last 1 year has had shortness of breath.  This occurs with exertion.  He reports he had no chest pain or chest pressure.  He reports on some days he is able to do a lot of activities such as playing golf.  He reports he can play at 18 holes without any major limitations.  On other days he can only play about 6.  He reports he can just get profoundly short of breath with low energy.  His wife has a apple watch.  No reported A. fib when monitoring his heart rhythm.  He does have a PAC on his EKG today.  He denies any rapid heartbeat sensation.  There is no history of heart disease.  He is never had a heart attack or stroke.  Most recent lipid profile total cholesterol 180, HDL 61, LDL 106, triglycerides 71.  TSH from 1 year ago was normal 3.5.  He is not diabetic.  Symptoms appear to occur infrequently.  He reports on some days he can climb several flights of stairs without any limitations but on other days he is quite limited.  Family history is significant for heart disease in his father.  He is a never smoker.  He does not drink alcohol or use drugs.  Cardiovascular examination is unremarkable.  EKG shows PACs and left anterior fascicular block.  He does have mild aortic stenosis.  Murmur also consistent with this.  He has normal body weight.  Blood pressure 112/60.  Overall in good shape for his age.  Problem List 1. Aortic stenosis -mild 09/27/2018 -Vmax 2.7 m/s, MG 16  mmHG, AVA 1.46 2. Mitral regurgitation, moderate -09/27/2018  Past Medical History: Past Medical History:  Diagnosis Date  . Cancer [facility])    colon polyp  . Complication of anesthesia    difficult to wake up after surgery  . DJD (degenerative joint disease)    Dr. Thurston Hole end stages of DJD 2011  . Hard of hearing   . Urinary frequency   . UTI (lower urinary tract infection)     Past Surgical History: Past Surgical History:  Procedure Laterality Date  . ANKLE SURGERY Right   . COLONOSCOPY W/ BIOPSIES AND POLYPECTOMY    . ELBOW SURGERY Right   . EYE SURGERY    . HERNIA REPAIR    . JOINT REPLACEMENT    . KNEE SURGERY Right    x4  . TOTAL KNEE ARTHROPLASTY Right   . TOTAL KNEE ARTHROPLASTY Left 06/18/2015   Procedure: TOTAL KNEE ARTHROPLASTY;  Surgeon: Gean Birchwood, MD;  Location: [facility] OR;  Service: Orthopedics;  Laterality: Left;    Current Medications: Current Meds  Medication Sig  . acetaminophen (TYLENOL 8 HOUR) 650 MG CR tablet Take 650 mg by mouth every 8 (eight) hours as needed for pain.  Marland Kitchen doxycycline (VIBRA-TABS) 100 MG tablet Take 100  mg by mouth 2 (two) times daily.  . finasteride (PROSCAR) 5 MG tablet Take 5 mg by mouth daily.  Marland Kitchen OVER THE COUNTER MEDICATION Take 1 tablet by mouth daily. Instaflex advanced.  . Tamsulosin HCl (FLOMAX) 0.4 MG CAPS Take 0.8 mg by mouth daily.  . valACYclovir (VALTREX) 500 MG tablet Take 500 mg by mouth 2 (two) times daily as needed (Cold sore).   . vitamin B-12 (CYANOCOBALAMIN) 100 MCG tablet Take 100 mcg by mouth daily.     Allergies:    Patient has no known allergies.   Social History: Social History   Socioeconomic History  . Marital status: Married    Spouse name: Not on file  . Number of children: 4  . Years of education: Not on file  . Highest education level: Not on file  Occupational History  . 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 Smoking status: Never Smoker  . Smokeless tobacco: Never Used  Substance  and Sexual Activity  . Alcohol use: No  . Drug use: No  . Sexual activity: Not on file  Other Topics Concern  . Not on file  Social History Narrative  . Not on file   Social Determinants of Health   Financial Resource Strain: Not on file  Food Insecurity: Not on file  Transportation Needs: Not on file  Physical Activity: Not on file  Stress: Not on file  Social Connections: Not on file     Family History: The patient's family history includes Breast cancer in his sister and sister; Heart attack in his mother; Heart disease in his father.  ROS:   All other ROS reviewed and negative. Pertinent positives noted in the HPI.     EKGs/Labs/Other Studies Reviewed:   The following studies were personally reviewed by me today:  EKG:  EKG is  ordered today.  The ekg ordered today demonstrates sinus bradycardia, heart rate 54, left anterior fascicular block, no acute ischemic changes, single PAC, and was personally reviewed by me.   TTE 09/27/2018  1. The left ventricle has normal systolic function of 0000000. The cavity  size was normal. There is no increased left ventricular wall thickness.  Echo evidence of pseudonormalization in diastolic relaxation. Elevated  mean left atrial pressure.  2. The right ventricle has normal systolic function. The cavity was  normal in size. There is no increase in right ventricular wall thickness.  Right ventricular systolic pressure is mildly elevated with an estimated  pressure of 32.4 mmHg.  3. Left atrial size was mildly dilated.  4. The mitral valve is normal in structure. There is mild thickening.  Mitral valve regurgitation is mild to moderate by color flow Doppler. The  MR jet is centrally-directed.  5. The tricuspid valve is normal in structure.  6. The aortic valve is tricuspid. There is moderate thickening and  moderate calcification of the aortic valve, with mildly decreased cusp  excursion. The calculated aortic valve area is 1.46  cm, consistent with  mild-moderate stenosis.  7. The pulmonic valve was normal in structure.  8. No evidence of left ventricular regional wall motion abnormalities.  Recent Labs: No results found for requested labs within last 8760 hours.   Recent Lipid Panel No results found for: CHOL, TRIG, HDL, CHOLHDL, VLDL, LDLCALC, LDLDIRECT  Physical Exam:   VS:  BP 112/60 (BP Location: Left Arm, Patient Position: Sitting, Cuff Size: Normal)   Pulse (!) 54   Ht 6' (1.829 m)   Wt 186 lb (  84.4 kg)   BMI 25.23 kg/m    Wt Readings from Last 3 Encounters:  08/23/20 186 lb (84.4 kg)  09/21/18 190 lb (86.2 kg)  06/18/15 190 lb (86.2 kg)    General: Well nourished, well developed, in no acute distress Head: Atraumatic, normal size  Eyes: PEERLA, EOMI  Neck: Supple, no JVD Endocrine: No thryomegaly Cardiac: Normal S1, S2; RRR; 2 out of 6 systolic ejection murmur Lungs: Clear to auscultation bilaterally, no wheezing, rhonchi or rales  Abd: Soft, nontender, no hepatomegaly  Ext: No edema, pulses 2+ Musculoskeletal: No deformities, BUE and BLE strength normal and equal Skin: Warm and dry, no rashes   Neuro: Alert and oriented to person, place, time, and situation, CNII-XII grossly intact, no focal deficits  Psych: Normal mood and affect   ASSESSMENT:   Gerald Spears is a 80 y.o. male who presents for the following: 1. Nonrheumatic aortic valve stenosis   2. Nonrheumatic mitral valve regurgitation   3. SOB (shortness of breath)   4. PAC (premature atrial contraction)   5. Fatigue, unspecified type     PLAN:   1. Nonrheumatic aortic valve stenosis 2. Nonrheumatic mitral valve regurgitation -Mild aortic stenosis moderate MR.  Murmur consistent with mild AS today.  Early peaking.  Not the cause of his shortness of breath.  3. SOB (shortness of breath) 4. PAC (premature atrial contraction) -1 year of shortness of breath.  No chest pain or pressure.  Symptoms occur periodically.  Do  not occur every day.  EKG with single PAC.  EKG also with left anterior fascicular block.  No ischemic changes.  He reports no chest pain or pressure.  I have a low suspicion for obstructive CAD.  He does have a family history of heart disease.  I have recommended a exercise nuclear medicine stress test.  I also want him to get a calcium scoring.  Given his PAC he may be having arrhythmia episodes.  I would like to obtain a 7-day Zio patch.  He has no evidence of heart failure exam.  We will exclude this with a BNP.  EF normal on most recent echo.  I see no need to repeat this.  He also needs an updated TSH.  I see no need for chest x-ray.  Lungs are clear.  He is a never smoker.  His wife also would like a CRP checked.  Disposition: Return in about 4 months (around 12/21/2020).  Medication Adjustments/Labs and Tests Ordered: Current medicines are reviewed at length with the patient today.  Concerns regarding medicines are outlined above.  Orders Placed This Encounter  Procedures  . CT CARDIAC SCORING (SELF PAY ONLY)  . Brain natriuretic peptide  . TSH  . C-reactive protein  . Cardiac Stress Test: Informed Consent Details: Physician/Practitioner Attestation; Transcribe to consent form and obtain patient signature  . MYOCARDIAL PERFUSION IMAGING   No orders of the defined types were placed in this encounter.   Patient Instructions  Medication Instructions:  Your physician recommends that you continue on your current medications as directed. Please refer to the Current Medication list given to you today.  *If you need a refill on your cardiac medications before your next appointment, please call your pharmacy*  Lab Work: Your physician recommends that you return for lab work TODAY:   BNP  CRP  TSH  You will need a COVID-19  test prior to your procedure. This is a Drive Up Visit at [id number] [place], [place]  [address]. Someone will direct you to the appropriate testing line. Stay  in your car and someone will be with you shortly.  If you have labs (blood work) drawn today and your tests are completely normal, you will receive your results only by: Marland Kitchen MyChart Message (if you have MyChart) OR . A paper copy in the mail If you have any lab test that is abnormal or we need to change your treatment, we will call you to review the results.  Testing/Procedures: Your physician has requested that you have a Exercise lexiscan myoview. For further information please visit [email or web address]. Please follow instruction sheet, as given.  ZIO XT- Long Term Monitor Instructions   Your physician has requested you wear your ZIO patch monitor 7 days.   This is a single patch monitor.  [employer] supplies one patch monitor per enrollment.  Additional stickers are not available.   Please do not apply patch if you will be having a Nuclear Stress Test, Echocardiogram, Cardiac CT, MRI, or Chest Xray during the time frame you would be wearing the monitor. The patch cannot be worn during these tests.  You cannot remove and re-apply the ZIO XT patch monitor.   Your ZIO patch monitor will be sent USPS Priority mail from [facility] directly to your home address. The monitor may also be mailed to a PO BOX if home delivery is not available.   It may take 3-5 days to receive your monitor after you have been enrolled.   Once you have received you monitor, please review enclosed instructions.  Your monitor has already been registered assigning a specific monitor serial # to you.   Applying the monitor   Shave hair from upper left chest.   Hold abrader disc by orange tab.  Rub abrader in 40 strokes over left upper chest as indicated in your monitor instructions.   Clean area with 4 enclosed alcohol pads .  Use all pads to assure are is cleaned thoroughly.  Let dry.   Apply patch as indicated in monitor instructions.  Patch will be place under collarbone on left side of chest with arrow  pointing upward.   Rub patch adhesive wings for 2 minutes.Remove white label marked "1".  Remove white label marked "2".  Rub patch adhesive wings for 2 additional minutes.   While looking in a mirror, press and release button in center of patch.  A small green light will flash 3-4 times .  This will be your only indicator the monitor has been turned on.     Do not shower for the first 24 hours.  You may shower after the first 24 hours.   Press button if you feel a symptom. You will hear a small click.  Record Date, Time and Symptom in the Patient Log Book.   When you are ready to remove patch, follow instructions on last 2 pages of Patient Log Book.  Stick patch monitor onto last page of Patient Log Book.   Place Patient Log Book in [place] box.  Use locking tab on box and tape box closed securely.  The [employer] has [employer] on it.  Please place in mailbox as soon as possible.  Your physician should have your test results approximately 7 days after the monitor has been mailed back to [facility].   Call [facility] at [phone] if you have questions regarding your ZIO XT patch monitor.  Call them immediately if you see an orange light  blinking on your monitor.   If your monitor falls off in less than 4 days contact our Monitor department at [phone].  If your monitor becomes loose or falls off after 4 days call [employer] at [phone] for suggestions on securing your monitor.   Dr.O'Neal has ordered a CT coronary calcium score. This test is done at [address]. This is $99 out of pocket.   Coronary CalciumScan A coronary calcium scan is an imaging test used to look for deposits of calcium and other fatty materials (plaques) in the inner lining of the blood vessels of the heart (coronary arteries). These deposits of calcium and plaques can partly clog and narrow the coronary arteries without producing any symptoms or warning  signs. This puts a person at risk for a heart attack. This test can detect these deposits before symptoms develop. Tell a health care provider about:  Any allergies you have.  All medicines you are taking, including vitamins, herbs, eye drops, creams, and over-the-counter medicines.  Any problems you or family members have had with anesthetic medicines.  Any blood disorders you have.  Any surgeries you have had.  Any medical conditions you have.  Whether you are pregnant or may be pregnant. What are the risks? Generally, this is a safe procedure. However, problems may occur, including:  Harm to a pregnant woman and her unborn baby. This test involves the use of radiation. Radiation exposure can be dangerous to a pregnant woman and her unborn baby. If you are pregnant, you generally should not have this procedure done.  Slight increase in the risk of cancer. This is because of the radiation involved in the test. What happens before the procedure? No preparation is needed for this procedure. What happens during the procedure?  You will undress and remove any jewelry around your neck or chest.  You will put on a hospital gown.  Sticky electrodes will be placed on your chest. The electrodes will be connected to an electrocardiogram (ECG) machine to record a tracing of the electrical activity of your heart.  A CT scanner will take pictures of your heart. During this time, you will be asked to lie still and hold your breath for 2-3 seconds while a picture of your heart is being taken. The procedure may vary among health care providers and hospitals. What happens after the procedure?  You can get dressed.  You can return to your normal activities.  It is up to you to get the results of your test. Ask your health care provider, or the department that is doing the test, when your results will be ready. Summary  A coronary calcium scan is an imaging test used to look for deposits of  calcium and other fatty materials (plaques) in the inner lining of the blood vessels of the heart (coronary arteries).  Generally, this is a safe procedure. Tell your health care provider if you are pregnant or may be pregnant.  No preparation is needed for this procedure.  A CT scanner will take pictures of your heart.  You can return to your normal activities after the scan is done. This information is not intended to replace advice given to you by your health care provider. Make sure you discuss any questions you have with your health care provider. Document Released: 01/31/2008 Document Revised: 06/23/2016 Document Reviewed: 06/23/2016 Elsevier Interactive Patient Education  2017 [employer].    Follow-Up: At [facility], you and your health needs are our  priority.  As part of our continuing mission to provide you with exceptional heart care, we have created designated Provider Care Teams.  These Care Teams include your primary Cardiologist (physician) and Advanced Practice Providers (APPs -  Physician Assistants and Nurse Practitioners) who all work together to provide you with the care you need, when you need it.  We recommend signing up for the patient portal called "MyChart".  Sign up information is provided on this After Visit Summary.  MyChart is used to connect with patients for Virtual Visits (Telemedicine).  Patients are able to view lab/test results, encounter notes, upcoming appointments, etc.  Non-urgent messages can be sent to your provider as well.   To learn more about what you can do with MyChart, go to [email or web address].    Your next appointment:   4 month(s)  The format for your next appointment:   In Person  Provider:   Eleonore Chiquito, MD  Other Instructions      Time Spent with Patient: I have spent a total of 35 minutes with patient reviewing hospital notes, telemetry, EKGs, labs and examining the patient as well as establishing an assessment and  plan that was discussed with the patient.  > 50% of time was spent in direct patient care.  Signed, Addison Naegeli. Audie Box, [place]  [address], [place], [place] [address] [phone]  08/23/2020 10:34 AM

## 2020-08-23 ENCOUNTER — Ambulatory Visit (INDEPENDENT_AMBULATORY_CARE_PROVIDER_SITE_OTHER): Payer: Medicare Other | Admitting: Cardiovascular Disease

## 2020-08-23 ENCOUNTER — Encounter: Payer: Self-pay | Admitting: *Deleted

## 2020-08-23 ENCOUNTER — Encounter: Payer: Self-pay | Admitting: Cardiovascular Disease

## 2020-08-23 ENCOUNTER — Other Ambulatory Visit: Payer: Self-pay | Admitting: Cardiovascular Disease

## 2020-08-23 ENCOUNTER — Ambulatory Visit (INDEPENDENT_AMBULATORY_CARE_PROVIDER_SITE_OTHER): Payer: Medicare Other

## 2020-08-23 ENCOUNTER — Other Ambulatory Visit: Payer: Self-pay

## 2020-08-23 VITALS — BP 112/60 | HR 54 | Ht 72.0 in | Wt 186.0 lb

## 2020-08-23 DIAGNOSIS — R0602 Shortness of breath: Secondary | ICD-10-CM

## 2020-08-23 DIAGNOSIS — I350 Nonrheumatic aortic (valve) stenosis: Secondary | ICD-10-CM

## 2020-08-23 DIAGNOSIS — I340 Nonrheumatic mitral (valve) insufficiency: Secondary | ICD-10-CM

## 2020-08-23 DIAGNOSIS — R5383 Other fatigue: Secondary | ICD-10-CM

## 2020-08-23 DIAGNOSIS — I491 Atrial premature depolarization: Secondary | ICD-10-CM

## 2020-08-23 LAB — BRAIN NATRIURETIC PEPTIDE: BNP: 98 pg/mL (ref 0.0–100.0)

## 2020-08-23 LAB — C-REACTIVE PROTEIN: CRP: <1 mg/L (ref 0–10)

## 2020-08-23 LAB — TSH: TSH: 3.49 u[IU]/mL (ref 0.450–4.500)

## 2020-08-23 NOTE — Addendum Note (Signed)
Addended by: Sande Rives on: 08/23/2020 04:34 PM   Modules accepted: Orders

## 2020-08-23 NOTE — Addendum Note (Signed)
Addended by: Dorris Fetch on: 08/23/2020 04:00 PM   Modules accepted: Orders

## 2020-08-23 NOTE — Progress Notes (Signed)
Patient ID: Gerald Spears, male   DOB: 12-Feb-1941, 80 y.o.   MRN: [id number] Patient enrolled for [employer] to ship a 7 day ZIO XT long term holter monitor to his home.

## 2020-08-23 NOTE — Patient Instructions (Addendum)
Medication Instructions:  Your physician recommends that you continue on your current medications as directed. Please refer to the Current Medication list given to you today.  *If you need a refill on your cardiac medications before your next appointment, please call your pharmacy*  Lab Work: Your physician recommends that you return for lab work TODAY:   BNP  CRP  TSH  You will need a COVID-19  test prior to your procedure. This is a Drive Up Visit at [id number] [place], [place] [address]. Someone will direct you to the appropriate testing line. Stay in your car and someone will be with you shortly.  If you have labs (blood work) drawn today and your tests are completely normal, you will receive your results only by: Marland Kitchen MyChart Message (if you have MyChart) OR . A paper copy in the mail If you have any lab test that is abnormal or we need to change your treatment, we will call you to review the results.  Testing/Procedures: Your physician has requested that you have a Exercise lexiscan myoview. For further information please visit HugeFiesta.[email or web address]. Please follow instruction sheet, as given.  ZIO XT- Long Term Monitor Instructions   Your physician has requested you wear your ZIO patch monitor 7 days.   This is a single patch monitor.  [employer] supplies one patch monitor per enrollment.  Additional stickers are not available.   Please do not apply patch if you will be having a Nuclear Stress Test, Echocardiogram, Cardiac CT, MRI, or Chest Xray during the time frame you would be wearing the monitor. The patch cannot be worn during these tests.  You cannot remove and re-apply the ZIO XT patch monitor.   Your ZIO patch monitor will be sent USPS Priority mail from [facility] directly to your home address. The monitor may also be mailed to a PO BOX if home delivery is not available.   It may take 3-5 days to receive your monitor after you have been enrolled.   Once you  have received you monitor, please review enclosed instructions.  Your monitor has already been registered assigning a specific monitor serial # to you.   Applying the monitor   Shave hair from upper left chest.   Hold abrader disc by orange tab.  Rub abrader in 40 strokes over left upper chest as indicated in your monitor instructions.   Clean area with 4 enclosed alcohol pads .  Use all pads to assure are is cleaned thoroughly.  Let dry.   Apply patch as indicated in monitor instructions.  Patch will be place under collarbone on left side of chest with arrow pointing upward.   Rub patch adhesive wings for 2 minutes.Remove white label marked "1".  Remove white label marked "2".  Rub patch adhesive wings for 2 additional minutes.   While looking in a mirror, press and release button in center of patch.  A small green light will flash 3-4 times .  This will be your only indicator the monitor has been turned on.     Do not shower for the first 24 hours.  You may shower after the first 24 hours.   Press button if you feel a symptom. You will hear a small click.  Record Date, Time and Symptom in the Patient Log Book.   When you are ready to remove patch, follow instructions on last 2 pages of Patient Log Book.  Stick patch monitor onto last page of Patient Log Book.  Place Patient Log Book in Duck box.  Use locking tab on box and tape box closed securely.  The Orange and [employer] has [employer] on it.  Please place in mailbox as soon as possible.  Your physician should have your test results approximately 7 days after the monitor has been mailed back to [facility].   Call [facility] Customer Care at [phone] if you have questions regarding your ZIO XT patch monitor.  Call them immediately if you see an orange light blinking on your monitor.   If your monitor falls off in less than 4 days contact our Monitor department at [phone].  If your monitor becomes loose or falls  off after 4 days call [employer] at [phone] for suggestions on securing your monitor.   Dr.O'Neal has ordered a CT coronary calcium score. This test is done at [address]. This is $99 out of pocket.   Coronary CalciumScan A coronary calcium scan is an imaging test used to look for deposits of calcium and other fatty materials (plaques) in the inner lining of the blood vessels of the heart (coronary arteries). These deposits of calcium and plaques can partly clog and narrow the coronary arteries without producing any symptoms or warning signs. This puts a person at risk for a heart attack. This test can detect these deposits before symptoms develop. Tell a health care provider about:  Any allergies you have.  All medicines you are taking, including vitamins, herbs, eye drops, creams, and over-the-counter medicines.  Any problems you or family members have had with anesthetic medicines.  Any blood disorders you have.  Any surgeries you have had.  Any medical conditions you have.  Whether you are pregnant or may be pregnant. What are the risks? Generally, this is a safe procedure. However, problems may occur, including:  Harm to a pregnant woman and her unborn baby. This test involves the use of radiation. Radiation exposure can be dangerous to a pregnant woman and her unborn baby. If you are pregnant, you generally should not have this procedure done.  Slight increase in the risk of cancer. This is because of the radiation involved in the test. What happens before the procedure? No preparation is needed for this procedure. What happens during the procedure?  You will undress and remove any jewelry around your neck or chest.  You will put on a hospital gown.  Sticky electrodes will be placed on your chest. The electrodes will be connected to an electrocardiogram (ECG) machine to record a tracing of the electrical activity of your heart.  A CT scanner will  take pictures of your heart. During this time, you will be asked to lie still and hold your breath for 2-3 seconds while a picture of your heart is being taken. The procedure may vary among health care providers and hospitals. What happens after the procedure?  You can get dressed.  You can return to your normal activities.  It is up to you to get the results of your test. Ask your health care provider, or the department that is doing the test, when your results will be ready. Summary  A coronary calcium scan is an imaging test used to look for deposits of calcium and other fatty materials (plaques) in the inner lining of the blood vessels of the heart (coronary arteries).  Generally, this is a safe procedure. Tell your health care provider if you are pregnant or may be pregnant.  No preparation is  needed for this procedure.  A CT scanner will take pictures of your heart.  You can return to your normal activities after the scan is done. This information is not intended to replace advice given to you by your health care provider. Make sure you discuss any questions you have with your health care provider. Document Released: 01/31/2008 Document Revised: 06/23/2016 Document Reviewed: 06/23/2016 Elsevier Interactive Patient Education  2017 [employer].    Follow-Up: At [facility], you and your health needs are our priority.  As part of our continuing mission to provide you with exceptional heart care, we have created designated Provider Care Teams.  These Care Teams include your primary Cardiologist (physician) and Advanced Practice Providers (APPs -  Physician Assistants and Nurse Practitioners) who all work together to provide you with the care you need, when you need it.  We recommend signing up for the patient portal called "MyChart".  Sign up information is provided on this After Visit Summary.  MyChart is used to connect with patients for Virtual Visits (Telemedicine).  Patients  are able to view lab/test results, encounter notes, upcoming appointments, etc.  Non-urgent messages can be sent to your provider as well.   To learn more about what you can do with MyChart, go to [email or web address].    Your next appointment:   4 month(s)  The format for your next appointment:   In Person  Provider:   Lennie Odor, MD  Other Instructions

## 2020-08-24 ENCOUNTER — Telehealth (HOSPITAL_COMMUNITY): Payer: Self-pay | Admitting: *Deleted

## 2020-08-24 NOTE — Telephone Encounter (Signed)
Close encounter 

## 2020-08-25 ENCOUNTER — Other Ambulatory Visit (HOSPITAL_COMMUNITY)
Admission: RE | Admit: 2020-08-25 | Discharge: 2020-08-25 | Disposition: A | Discharge status: Home / Self Care | Payer: Medicare Other | Source: Ambulatory Visit | Attending: Cardiovascular Disease | Admitting: Cardiovascular Disease

## 2020-08-25 DIAGNOSIS — Z20822 Contact with and (suspected) exposure to covid-19: Secondary | ICD-10-CM | POA: Insufficient documentation

## 2020-08-25 DIAGNOSIS — Z01812 Encounter for preprocedural laboratory examination: Secondary | ICD-10-CM | POA: Diagnosis not present

## 2020-08-26 LAB — SARS CORONAVIRUS 2 (TAT 6-24 HRS): SARS Coronavirus 2: NEGATIVE

## 2020-08-29 ENCOUNTER — Other Ambulatory Visit: Payer: Self-pay

## 2020-08-29 ENCOUNTER — Ambulatory Visit (HOSPITAL_COMMUNITY)
Admission: RE | Admit: 2020-08-29 | Discharge: 2020-08-29 | Disposition: A | Discharge status: Home / Self Care | Payer: Medicare Other | Source: Ambulatory Visit | Attending: Cardiology | Admitting: Cardiology

## 2020-08-29 DIAGNOSIS — R0602 Shortness of breath: Secondary | ICD-10-CM | POA: Insufficient documentation

## 2020-08-29 LAB — MYOCARDIAL PERFUSION IMAGING
Estimated workload: 10.1 METS
Exercise duration (min): 9 min
Exercise duration (sec): 30 s
LV dias vol: 140 mL (ref 62–150)
LV sys vol: 78 mL
MPHR: 140 {beats}/min
Peak HR: 129 {beats}/min
Percent HR: 92 %
Rest HR: 50 {beats}/min
SDS: 3
SRS: 4
SSS: 7
TID: 1.03

## 2020-08-29 MED ORDER — TECHNETIUM TC 99M TETROFOSMIN IV KIT
9.9000 | PACK | Freq: Once | INTRAVENOUS | Status: AC | PRN
  Administered 2020-08-29: 9.9 via INTRAVENOUS
  Filled 2020-08-29: qty 10

## 2020-08-29 MED ORDER — TECHNETIUM TC 99M TETROFOSMIN IV KIT
28.7000 | PACK | Freq: Once | INTRAVENOUS | Status: AC | PRN
  Administered 2020-08-29: 28.7 via INTRAVENOUS
  Filled 2020-08-29: qty 29

## 2020-08-29 NOTE — Addendum Note (Signed)
Addended by: Venetia Maxon on: 08/29/2020 04:29 PM   Modules accepted: Orders

## 2020-08-30 ENCOUNTER — Ambulatory Visit (INDEPENDENT_AMBULATORY_CARE_PROVIDER_SITE_OTHER)
Admission: RE | Admit: 2020-08-30 | Discharge: 2020-08-30 | Disposition: A | Discharge status: Home / Self Care | Payer: Self-pay | Source: Ambulatory Visit | Attending: Cardiovascular Disease | Admitting: Cardiovascular Disease

## 2020-08-30 DIAGNOSIS — I350 Nonrheumatic aortic (valve) stenosis: Secondary | ICD-10-CM

## 2020-08-30 DIAGNOSIS — I340 Nonrheumatic mitral (valve) insufficiency: Secondary | ICD-10-CM

## 2020-08-30 DIAGNOSIS — R0602 Shortness of breath: Secondary | ICD-10-CM

## 2020-08-31 DIAGNOSIS — R0602 Shortness of breath: Secondary | ICD-10-CM | POA: Diagnosis not present

## 2020-08-31 DIAGNOSIS — I491 Atrial premature depolarization: Secondary | ICD-10-CM

## 2020-09-06 NOTE — H&P (View-Only) (Signed)
Cardiology Office Note:   Date:  09/07/2020  NAME:  Gerald Spears    MRN: [id number] DOB:  1940-12-29   PCP:  [name], MD  Cardiologist:  Evalina Field, MD   Referring MD: [name], MD   Chief Complaint  Patient presents with  . Follow-up    Follow-up   History of Present Illness:   NATHANIE Spears is a 80 y.o. male with a hx of mild AS who presents for follow-up of SOB.  He is still getting short of breath.  This occurs with heavy exertion.  Does not occur all the time.  We did go over his testing which shows a coronary calcium score that is elevated.  Stress test was also read for concern for apical ischemia.  We did discuss in the office that this could be a false positive.  However given his symptoms of shortness of breath or worsening I think proceeding with cardiac catheterization is warranted.  He is in agreement.  We did discuss the risks and benefits of the procedure in the office today.  He is okay to proceed.  Symptoms of shortness of breath or not improving.  Denies any chest pain or pressure.  No palpitations.  A monitor was ordered at her last visit to exclude arrhythmia.  Male back today.  Problem List 1. Aortic stenosis -mild 09/27/2018 -Vmax 2.7 m/s, MG 16 mmHG, AVA 1.46 2. Mitral regurgitation, moderate -09/27/2018 3. CAD -CAC score 682 (62nd percentile) -Apical ischemia 08/29/2020 4. HLD -T chol 180, LDL 106, HDL 61, TG 71   Past Medical History: Past Medical History:  Diagnosis Date  . Cancer [facility])    colon polyp  . Complication of anesthesia    difficult to wake up after surgery  . DJD (degenerative joint disease)    Dr. Noemi Chapel end stages of DJD 2011  . Hard of hearing   . Urinary frequency   . UTI (lower urinary tract infection)     Past Surgical History: Past Surgical History:  Procedure Laterality Date  . ANKLE SURGERY Right   . COLONOSCOPY W/ BIOPSIES AND POLYPECTOMY    . ELBOW SURGERY Right   . EYE SURGERY    . HERNIA  REPAIR    . JOINT REPLACEMENT    . KNEE SURGERY Right    x4  . TOTAL KNEE ARTHROPLASTY Right   . TOTAL KNEE ARTHROPLASTY Left 06/18/2015   Procedure: TOTAL KNEE ARTHROPLASTY;  Surgeon: Frederik Pear, MD;  Location: [facility];  Service: Orthopedics;  Laterality: Left;    Current Medications: Current Meds  Medication Sig  . acetaminophen (TYLENOL) 650 MG CR tablet Take 650 mg by mouth every 8 (eight) hours as needed for pain.  Marland Kitchen aspirin EC 81 MG tablet Take 1 tablet (81 mg total) by mouth daily. Swallow whole.  Marland Kitchen doxycycline (VIBRA-TABS) 100 MG tablet Take 100 mg by mouth 2 (two) times daily.  . finasteride (PROSCAR) 5 MG tablet Take 5 mg by mouth daily.  Marland Kitchen OVER THE COUNTER MEDICATION Take 1 tablet by mouth daily. Instaflex advanced.  . rosuvastatin (CRESTOR) 20 MG tablet Take 1 tablet (20 mg total) by mouth daily.  . Tamsulosin HCl (FLOMAX) 0.4 MG CAPS Take 0.8 mg by mouth daily.  . valACYclovir (VALTREX) 500 MG tablet Take 500 mg by mouth 2 (two) times daily as needed (Cold sore).   . vitamin B-12 (CYANOCOBALAMIN) 100 MCG tablet Take 100 mcg by mouth daily.     Allergies:  Patient has no known allergies.   Social History: Social History   Socioeconomic History  . Marital status: Married    Spouse name: Not on file  . Number of children: 4  . Years of education: Not on file  . Highest education level: Not on file  Occupational History  . 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 Smoking status: Never Smoker  . Smokeless tobacco: Never Used  Substance and Sexual Activity  . Alcohol use: No  . Drug use: No  . Sexual activity: Not on file  Other Topics Concern  . Not on file  Social History Narrative  . Not on file   Social Determinants of Health   Financial Resource Strain: Not on file  Food Insecurity: Not on file  Transportation Needs: Not on file  Physical Activity: Not on file  Stress: Not on file  Social Connections: Not on file    Family History: The  patient's family history includes Breast cancer in his sister and sister; Heart attack in his mother; Heart disease in his father.  ROS:   All other ROS reviewed and negative. Pertinent positives noted in the HPI.     EKGs/Labs/Other Studies Reviewed:   The following studies were personally reviewed by me today:  TTE 09/27/2018 1. The left ventricle has normal systolic function of 02-72%. The cavity  size was normal. There is no increased left ventricular wall thickness.  Echo evidence of pseudonormalization in diastolic relaxation. Elevated  mean left atrial pressure.  2. The right ventricle has normal systolic function. The cavity was  normal in size. There is no increase in right ventricular wall thickness.  Right ventricular systolic pressure is mildly elevated with an estimated  pressure of 32.4 mmHg.  3. Left atrial size was mildly dilated.  4. The mitral valve is normal in structure. There is mild thickening.  Mitral valve regurgitation is mild to moderate by color flow Doppler. The  MR jet is centrally-directed.  5. The tricuspid valve is normal in structure.  6. The aortic valve is tricuspid. There is moderate thickening and  moderate calcification of the aortic valve, with mildly decreased cusp  excursion. The calculated aortic valve area is 1.46 cm, consistent with  mild-moderate stenosis.  7. The pulmonic valve was normal in structure.  8. No evidence of left ventricular regional wall motion abnormalities.   CT CAC scoring 08/30/2020  Coronary calcium score of 682. This was 62nd percentile for age and sex matched controls.  NM Stress 08/29/2020 Exercise time: 9:30 min Workload: 10.1 METS, above average exercise capacity Test stopped due to: fatigue, SOB BP response: normal HR response: normal Rhythm: sinus rhythm with PACs at baseline. SR with stress. Sinus rhythm with PACs, PVCs, and one 5 beat run of SVT in recovery.  ECG: no ischemia on stress  ECG  Perfusion images show small partially reversible perfusion defect in the apical cap (segment 17) and lateral apex. Moderate perfusion defect with stress at segment 17 partially reversible to mild at rest. Mild perfusion defect in lateral apex returns to normal at rest.   Stress EF calculates to 44%, however visually appears 50%. ECG gating may have been impacted by ectopy in recovery. Consider echo to confirm EF.   Findings overall low-intermediate risk, given possible reduced EF and small area of ischemia at the apex.    Recent Labs: 08/23/2020: BNP 98.0; TSH 3.490   Recent Lipid Panel No results found for: CHOL, TRIG, HDL, CHOLHDL, VLDL, LDLCALC, LDLDIRECT  Physical Exam:   VS:  BP 101/65 (BP Location: Left Arm, Patient Position: Sitting)   Pulse (!) 52   Ht 5\' 11"  (1.803 m)   Wt 187 lb (84.8 kg)   SpO2 98%   BMI 26.08 kg/m    Wt Readings from Last 3 Encounters:  09/07/20 187 lb (84.8 kg)  08/29/20 186 lb (84.4 kg)  08/23/20 186 lb (84.4 kg)    General: Well nourished, well developed, in no acute distress Head: Atraumatic, normal size  Eyes: PEERLA, EOMI  Neck: Supple, no JVD Endocrine: No thryomegaly Cardiac: Normal S1, S2; RRR; 2 out of 6 systolic ejection murmur Lungs: Clear to auscultation bilaterally, no wheezing, rhonchi or rales  Abd: Soft, nontender, no hepatomegaly  Ext: No edema, pulses 2+ Musculoskeletal: No deformities, BUE and BLE strength normal and equal Skin: Warm and dry, no rashes   Neuro: Alert and oriented to person, place, time, and situation, CNII-XII grossly intact, no focal deficits  Psych: Normal mood and affect   ASSESSMENT:   WOODARD DANZA is a 80 y.o. male who presents for the following: 1. Abnormal nuclear stress test   2. SOB (shortness of breath)   3. Coronary artery disease involving native coronary artery of native heart without angina pectoris   4. Agatston coronary artery calcium score greater than 400   5. Mixed  hyperlipidemia   6. Nonrheumatic aortic valve stenosis     PLAN:   1. Abnormal nuclear stress test 2. SOB (shortness of breath) 3. Coronary artery disease involving native coronary artery of native heart without angina pectoris 4. Agatston coronary artery calcium score greater than 400 -CAC score 682 (62nd percentile) -Apical ischemia 08/29/2020 -He has had progressively worsening shortness of breath.  Stress test was abnormal.  Concerning for apical ischemia.  Given the fact that he has heavy coronary calcifications I think a left heart catheterization is warranted.  Risk and benefits were discussed in the office today.  He is okay to proceed. -No chest pain but progressively worsening shortness of breath.  He will start an aspirin and statin.  I see no need for a beta-blocker at this time.  5. Mixed hyperlipidemia -Start Crestor 20 mg a day.  6. Nonrheumatic aortic valve stenosis -Mild aortic stenosis.  Repeat echocardiogram in 3 to 5 years.  Shared Decision Making/Informed Consent The risks [stroke (1 in 1000), death (1 in 1000), kidney failure [usually temporary] (1 in 500), bleeding (1 in 200), allergic reaction [possibly serious] (1 in 200)], benefits (diagnostic support and management of coronary artery disease) and alternatives of a cardiac catheterization were discussed in detail with Mr. Aroche and he is willing to proceed.  Disposition: Return in about 2 weeks (around 09/21/2020).  Medication Adjustments/Labs and Tests Ordered: Current medicines are reviewed at length with the patient today.  Concerns regarding medicines are outlined above.  Orders Placed This Encounter  Procedures  . Basic metabolic panel  . CBC   Meds ordered this encounter  Medications  . rosuvastatin (CRESTOR) 20 MG tablet    Sig: Take 1 tablet (20 mg total) by mouth daily.    Dispense:  90 tablet    Refill:  3  . aspirin EC 81 MG tablet    Sig: Take 1 tablet (81 mg total) by mouth daily. Swallow  whole.    Dispense:  90 tablet    Refill:  3    Patient Instructions  Medication Instructions:  Start Aspirin 81 mg daily  Start Zetia 20  mg daily  *If you need a refill on your cardiac medications before your next appointment, please call your pharmacy*   Lab Work: CBC, BMET today COVID TESTING: Monday 01/24 at 1:20 PM ([place], [place])  If you have labs (blood work) drawn today and your tests are completely normal, you will receive your results only by: Marland Kitchen MyChart Message (if you have MyChart) OR . A paper copy in the mail If you have any lab test that is abnormal or we need to change your treatment, we will call you to review the results.   Testing/Procedures:  Your physician has requested that you have a cardiac catheterization. Cardiac catheterization is used to diagnose and/or treat various heart conditions. Doctors may recommend this procedure for a number of different reasons. The most common reason is to evaluate chest pain. Chest pain can be a symptom of coronary artery disease (CAD), and cardiac catheterization can show whether plaque is narrowing or blocking your heart's arteries. This procedure is also used to evaluate the valves, as well as measure the blood flow and oxygen levels in different parts of your heart. For further information please visit HugeFiesta.[email or web address]. Please follow instruction sheet, as given.     Follow-Up: At [facility], you and your health needs are our priority.  As part of our continuing mission to provide you with exceptional heart care, we have created designated Provider Care Teams.  These Care Teams include your primary Cardiologist (physician) and Advanced Practice Providers (APPs -  Physician Assistants and Nurse Practitioners) who all work together to provide you with the care you need, when you need it.  We recommend signing up for the patient portal called "MyChart".  Sign up information is provided on this After Visit  Summary.  MyChart is used to connect with patients for Virtual Visits (Telemedicine).  Patients are able to view lab/test results, encounter notes, upcoming appointments, etc.  Non-urgent messages can be sent to your provider as well.   To learn more about what you can do with MyChart, go to [email or web address].    Your next appointment:   2 week(s)  The format for your next appointment:   In Person  Provider:   Eleonore Chiquito, MD   Other Instructions    [place] [address] Dept: [phone] Loc: [facility]  09/07/2020  You are scheduled for a Cardiac Catheterization on Wednesday, January 26 with Dr. Sherren Mocha.  1. Please arrive at the [facility] (Main Entrance A) at [facility]: [address] [place], [place] [address] at 8:00 AM (This time is two hours before your procedure to ensure your preparation). Free valet parking service is available.   Special note: Every effort is made to have your procedure done on time. Please understand that emergencies sometimes delay scheduled procedures.  2. Diet: Do not eat solid foods after midnight.  The patient may have clear liquids until 5am upon the day of the procedure.  3. Labs: You will need to have blood drawn today- CBC, BMET   4. Medication instructions in preparation for your procedure:   Contrast Allergy: No  On the morning of your procedure, take your Aspirin and any morning medicines NOT listed above.  You may use sips of water.  5. Plan for one night stay--bring personal belongings. 6. Bring a current list of your medications and current insurance cards. 7. You MUST  have a responsible person to drive you home. 8. Someone MUST be with you the first 24 hours after you arrive home or your discharge will be delayed. 9. Please wear clothes that are easy to get on and off  and wear slip-on shoes.  Thank you for allowing [place] to care for you!   -- [facility]      Time Spent with Patient: I have spent a total of 35 minutes with patient reviewing hospital notes, telemetry, EKGs, labs and examining the patient as well as establishing an assessment and plan that was discussed with the patient.  > 50% of time was spent in direct patient care.  Signed, Addison Naegeli. Audie Box, [place]  [address], [place], [place] [address] [phone]  09/07/2020 3:41 PM

## 2020-09-06 NOTE — Progress Notes (Signed)
Cardiology Office Note:   Date:  09/07/2020  NAME:  Gerald Spears    MRN: [id number] DOB:  1940-12-29   PCP:  [name], MD  Cardiologist:  Evalina Field, MD   Referring MD: [name], MD   Chief Complaint  Patient presents with  . Follow-up    Follow-up   History of Present Illness:   NATHANIE Spears is a 80 y.o. male with a hx of mild AS who presents for follow-up of SOB.  He is still getting short of breath.  This occurs with heavy exertion.  Does not occur all the time.  We did go over his testing which shows a coronary calcium score that is elevated.  Stress test was also read for concern for apical ischemia.  We did discuss in the office that this could be a false positive.  However given his symptoms of shortness of breath or worsening I think proceeding with cardiac catheterization is warranted.  He is in agreement.  We did discuss the risks and benefits of the procedure in the office today.  He is okay to proceed.  Symptoms of shortness of breath or not improving.  Denies any chest pain or pressure.  No palpitations.  A monitor was ordered at her last visit to exclude arrhythmia.  Male back today.  Problem List 1. Aortic stenosis -mild 09/27/2018 -Vmax 2.7 m/s, MG 16 mmHG, AVA 1.46 2. Mitral regurgitation, moderate -09/27/2018 3. CAD -CAC score 682 (62nd percentile) -Apical ischemia 08/29/2020 4. HLD -T chol 180, LDL 106, HDL 61, TG 71   Past Medical History: Past Medical History:  Diagnosis Date  . Cancer [facility])    colon polyp  . Complication of anesthesia    difficult to wake up after surgery  . DJD (degenerative joint disease)    Dr. Noemi Chapel end stages of DJD 2011  . Hard of hearing   . Urinary frequency   . UTI (lower urinary tract infection)     Past Surgical History: Past Surgical History:  Procedure Laterality Date  . ANKLE SURGERY Right   . COLONOSCOPY W/ BIOPSIES AND POLYPECTOMY    . ELBOW SURGERY Right   . EYE SURGERY    . HERNIA  REPAIR    . JOINT REPLACEMENT    . KNEE SURGERY Right    x4  . TOTAL KNEE ARTHROPLASTY Right   . TOTAL KNEE ARTHROPLASTY Left 06/18/2015   Procedure: TOTAL KNEE ARTHROPLASTY;  Surgeon: Frederik Pear, MD;  Location: [facility];  Service: Orthopedics;  Laterality: Left;    Current Medications: Current Meds  Medication Sig  . acetaminophen (TYLENOL) 650 MG CR tablet Take 650 mg by mouth every 8 (eight) hours as needed for pain.  Marland Kitchen aspirin EC 81 MG tablet Take 1 tablet (81 mg total) by mouth daily. Swallow whole.  Marland Kitchen doxycycline (VIBRA-TABS) 100 MG tablet Take 100 mg by mouth 2 (two) times daily.  . finasteride (PROSCAR) 5 MG tablet Take 5 mg by mouth daily.  Marland Kitchen OVER THE COUNTER MEDICATION Take 1 tablet by mouth daily. Instaflex advanced.  . rosuvastatin (CRESTOR) 20 MG tablet Take 1 tablet (20 mg total) by mouth daily.  . Tamsulosin HCl (FLOMAX) 0.4 MG CAPS Take 0.8 mg by mouth daily.  . valACYclovir (VALTREX) 500 MG tablet Take 500 mg by mouth 2 (two) times daily as needed (Cold sore).   . vitamin B-12 (CYANOCOBALAMIN) 100 MCG tablet Take 100 mcg by mouth daily.     Allergies:  Patient has no known allergies.   Social History: Social History   Socioeconomic History  . Marital status: Married    Spouse name: Not on file  . Number of children: 4  . Years of education: Not on file  . Highest education level: Not on file  Occupational History  . 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 Smoking status: Never Smoker  . Smokeless tobacco: Never Used  Substance and Sexual Activity  . Alcohol use: No  . Drug use: No  . Sexual activity: Not on file  Other Topics Concern  . Not on file  Social History Narrative  . Not on file   Social Determinants of Health   Financial Resource Strain: Not on file  Food Insecurity: Not on file  Transportation Needs: Not on file  Physical Activity: Not on file  Stress: Not on file  Social Connections: Not on file    Family History: The  patient's family history includes Breast cancer in his sister and sister; Heart attack in his mother; Heart disease in his father.  ROS:   All other ROS reviewed and negative. Pertinent positives noted in the HPI.     EKGs/Labs/Other Studies Reviewed:   The following studies were personally reviewed by me today:  TTE 09/27/2018 1. The left ventricle has normal systolic function of 02-72%. The cavity  size was normal. There is no increased left ventricular wall thickness.  Echo evidence of pseudonormalization in diastolic relaxation. Elevated  mean left atrial pressure.  2. The right ventricle has normal systolic function. The cavity was  normal in size. There is no increase in right ventricular wall thickness.  Right ventricular systolic pressure is mildly elevated with an estimated  pressure of 32.4 mmHg.  3. Left atrial size was mildly dilated.  4. The mitral valve is normal in structure. There is mild thickening.  Mitral valve regurgitation is mild to moderate by color flow Doppler. The  MR jet is centrally-directed.  5. The tricuspid valve is normal in structure.  6. The aortic valve is tricuspid. There is moderate thickening and  moderate calcification of the aortic valve, with mildly decreased cusp  excursion. The calculated aortic valve area is 1.46 cm, consistent with  mild-moderate stenosis.  7. The pulmonic valve was normal in structure.  8. No evidence of left ventricular regional wall motion abnormalities.   CT CAC scoring 08/30/2020  Coronary calcium score of 682. This was 62nd percentile for age and sex matched controls.  NM Stress 08/29/2020 Exercise time: 9:30 min Workload: 10.1 METS, above average exercise capacity Test stopped due to: fatigue, SOB BP response: normal HR response: normal Rhythm: sinus rhythm with PACs at baseline. SR with stress. Sinus rhythm with PACs, PVCs, and one 5 beat run of SVT in recovery.  ECG: no ischemia on stress  ECG  Perfusion images show small partially reversible perfusion defect in the apical cap (segment 17) and lateral apex. Moderate perfusion defect with stress at segment 17 partially reversible to mild at rest. Mild perfusion defect in lateral apex returns to normal at rest.   Stress EF calculates to 44%, however visually appears 50%. ECG gating may have been impacted by ectopy in recovery. Consider echo to confirm EF.   Findings overall low-intermediate risk, given possible reduced EF and small area of ischemia at the apex.    Recent Labs: 08/23/2020: BNP 98.0; TSH 3.490   Recent Lipid Panel No results found for: CHOL, TRIG, HDL, CHOLHDL, VLDL, LDLCALC, LDLDIRECT  Physical Exam:   VS:  BP 101/65 (BP Location: Left Arm, Patient Position: Sitting)   Pulse (!) 52   Ht 5\' 11"  (1.803 m)   Wt 187 lb (84.8 kg)   SpO2 98%   BMI 26.08 kg/m    Wt Readings from Last 3 Encounters:  09/07/20 187 lb (84.8 kg)  08/29/20 186 lb (84.4 kg)  08/23/20 186 lb (84.4 kg)    General: Well nourished, well developed, in no acute distress Head: Atraumatic, normal size  Eyes: PEERLA, EOMI  Neck: Supple, no JVD Endocrine: No thryomegaly Cardiac: Normal S1, S2; RRR; 2 out of 6 systolic ejection murmur Lungs: Clear to auscultation bilaterally, no wheezing, rhonchi or rales  Abd: Soft, nontender, no hepatomegaly  Ext: No edema, pulses 2+ Musculoskeletal: No deformities, BUE and BLE strength normal and equal Skin: Warm and dry, no rashes   Neuro: Alert and oriented to person, place, time, and situation, CNII-XII grossly intact, no focal deficits  Psych: Normal mood and affect   ASSESSMENT:   Gerald Spears is a 80 y.o. male who presents for the following: 1. Abnormal nuclear stress test   2. SOB (shortness of breath)   3. Coronary artery disease involving native coronary artery of native heart without angina pectoris   4. Agatston coronary artery calcium score greater than 400   5. Mixed  hyperlipidemia   6. Nonrheumatic aortic valve stenosis     PLAN:   1. Abnormal nuclear stress test 2. SOB (shortness of breath) 3. Coronary artery disease involving native coronary artery of native heart without angina pectoris 4. Agatston coronary artery calcium score greater than 400 -CAC score 682 (62nd percentile) -Apical ischemia 08/29/2020 -He has had progressively worsening shortness of breath.  Stress test was abnormal.  Concerning for apical ischemia.  Given the fact that he has heavy coronary calcifications I think a left heart catheterization is warranted.  Risk and benefits were discussed in the office today.  He is okay to proceed. -No chest pain but progressively worsening shortness of breath.  He will start an aspirin and statin.  I see no need for a beta-blocker at this time.  5. Mixed hyperlipidemia -Start Crestor 20 mg a day.  6. Nonrheumatic aortic valve stenosis -Mild aortic stenosis.  Repeat echocardiogram in 3 to 5 years.  Shared Decision Making/Informed Consent The risks [stroke (1 in 1000), death (1 in 1000), kidney failure [usually temporary] (1 in 500), bleeding (1 in 200), allergic reaction [possibly serious] (1 in 200)], benefits (diagnostic support and management of coronary artery disease) and alternatives of a cardiac catheterization were discussed in detail with Mr. Bailin and he is willing to proceed.  Disposition: Return in about 2 weeks (around 09/21/2020).  Medication Adjustments/Labs and Tests Ordered: Current medicines are reviewed at length with the patient today.  Concerns regarding medicines are outlined above.  Orders Placed This Encounter  Procedures  . Basic metabolic panel  . CBC   Meds ordered this encounter  Medications  . rosuvastatin (CRESTOR) 20 MG tablet    Sig: Take 1 tablet (20 mg total) by mouth daily.    Dispense:  90 tablet    Refill:  3  . aspirin EC 81 MG tablet    Sig: Take 1 tablet (81 mg total) by mouth daily. Swallow  whole.    Dispense:  90 tablet    Refill:  3    Patient Instructions  Medication Instructions:  Start Aspirin 81 mg daily  Start Zetia 20  mg daily  *If you need a refill on your cardiac medications before your next appointment, please call your pharmacy*   Lab Work: CBC, BMET today COVID TESTING: Monday 01/24 at 1:20 PM ([place], [place])  If you have labs (blood work) drawn today and your tests are completely normal, you will receive your results only by: Marland Kitchen MyChart Message (if you have MyChart) OR . A paper copy in the mail If you have any lab test that is abnormal or we need to change your treatment, we will call you to review the results.   Testing/Procedures:  Your physician has requested that you have a cardiac catheterization. Cardiac catheterization is used to diagnose and/or treat various heart conditions. Doctors may recommend this procedure for a number of different reasons. The most common reason is to evaluate chest pain. Chest pain can be a symptom of coronary artery disease (CAD), and cardiac catheterization can show whether plaque is narrowing or blocking your heart's arteries. This procedure is also used to evaluate the valves, as well as measure the blood flow and oxygen levels in different parts of your heart. For further information please visit HugeFiesta.[email or web address]. Please follow instruction sheet, as given.     Follow-Up: At [facility], you and your health needs are our priority.  As part of our continuing mission to provide you with exceptional heart care, we have created designated Provider Care Teams.  These Care Teams include your primary Cardiologist (physician) and Advanced Practice Providers (APPs -  Physician Assistants and Nurse Practitioners) who all work together to provide you with the care you need, when you need it.  We recommend signing up for the patient portal called "MyChart".  Sign up information is provided on this After Visit  Summary.  MyChart is used to connect with patients for Virtual Visits (Telemedicine).  Patients are able to view lab/test results, encounter notes, upcoming appointments, etc.  Non-urgent messages can be sent to your provider as well.   To learn more about what you can do with MyChart, go to [email or web address].    Your next appointment:   2 week(s)  The format for your next appointment:   In Person  Provider:   Eleonore Chiquito, MD   Other Instructions    [place] [address] Dept: [phone] Loc: [place]  09/07/2020  You are scheduled for a Cardiac Catheterization on Wednesday, January 26 with Dr. Sherren Mocha.  1. Please arrive at the [facility] (Main Entrance A) at [facility]: [address] [place], [place] [address] at 8:00 AM (This time is two hours before your procedure to ensure your preparation). Free valet parking service is available.   Special note: Every effort is made to have your procedure done on time. Please understand that emergencies sometimes delay scheduled procedures.  2. Diet: Do not eat solid foods after midnight.  The patient may have clear liquids until 5am upon the day of the procedure.  3. Labs: You will need to have blood drawn today- CBC, BMET   4. Medication instructions in preparation for your procedure:   Contrast Allergy: No  On the morning of your procedure, take your Aspirin and any morning medicines NOT listed above.  You may use sips of water.  5. Plan for one night stay--bring personal belongings. 6. Bring a current list of your medications and current insurance cards. 7. You MUST  have a responsible person to drive you home. 8. Someone MUST be with you the first 24 hours after you arrive home or your discharge will be delayed. 9. Please wear clothes that are easy to get on and off  and wear slip-on shoes.  Thank you for allowing [place] to care for you!   -- [facility]      Time Spent with Patient: I have spent a total of 35 minutes with patient reviewing hospital notes, telemetry, EKGs, labs and examining the patient as well as establishing an assessment and plan that was discussed with the patient.  > 50% of time was spent in direct patient care.  Signed, Addison Naegeli. Audie Box, [place]  [address] [place], [place] [address] [phone]  09/07/2020 3:41 PM

## 2020-09-07 ENCOUNTER — Encounter: Payer: Self-pay | Admitting: Cardiovascular Disease

## 2020-09-07 ENCOUNTER — Other Ambulatory Visit: Payer: Self-pay

## 2020-09-07 ENCOUNTER — Ambulatory Visit (INDEPENDENT_AMBULATORY_CARE_PROVIDER_SITE_OTHER): Payer: Medicare Other | Admitting: Cardiovascular Disease

## 2020-09-07 VITALS — BP 101/65 | HR 52 | Ht 71.0 in | Wt 187.0 lb

## 2020-09-07 DIAGNOSIS — I2510 Atherosclerotic heart disease of native coronary artery without angina pectoris: Secondary | ICD-10-CM

## 2020-09-07 DIAGNOSIS — R0602 Shortness of breath: Secondary | ICD-10-CM

## 2020-09-07 DIAGNOSIS — I350 Nonrheumatic aortic (valve) stenosis: Secondary | ICD-10-CM | POA: Diagnosis not present

## 2020-09-07 DIAGNOSIS — R9439 Abnormal result of other cardiovascular function study: Secondary | ICD-10-CM | POA: Diagnosis not present

## 2020-09-07 DIAGNOSIS — E782 Mixed hyperlipidemia: Secondary | ICD-10-CM

## 2020-09-07 DIAGNOSIS — R931 Abnormal findings on diagnostic imaging of heart and coronary circulation: Secondary | ICD-10-CM | POA: Diagnosis not present

## 2020-09-07 MED ORDER — ROSUVASTATIN CALCIUM 20 MG PO TABS: 20.0000 mg | ORAL_TABLET | Freq: Every day | ORAL | 3 refills | Status: DC

## 2020-09-07 MED ORDER — ASPIRIN EC 81 MG PO TBEC: 81.0000 mg | DELAYED_RELEASE_TABLET | Freq: Every day | ORAL | 3 refills | Status: DC

## 2020-09-07 NOTE — Patient Instructions (Addendum)
Medication Instructions:  Start Aspirin 81 mg daily  Start Zetia 20 mg daily  *If you need a refill on your cardiac medications before your next appointment, please call your pharmacy*   Lab Work: CBC, BMET today COVID TESTING: Monday 01/24 at 1:20 PM ([place], [place])  If you have labs (blood work) drawn today and your tests are completely normal, you will receive your results only by: Marland Kitchen MyChart Message (if you have MyChart) OR . A paper copy in the mail If you have any lab test that is abnormal or we need to change your treatment, we will call you to review the results.   Testing/Procedures:  Your physician has requested that you have a cardiac catheterization. Cardiac catheterization is used to diagnose and/or treat various heart conditions. Doctors may recommend this procedure for a number of different reasons. The most common reason is to evaluate chest pain. Chest pain can be a symptom of coronary artery disease (CAD), and cardiac catheterization can show whether plaque is narrowing or blocking your heart's arteries. This procedure is also used to evaluate the valves, as well as measure the blood flow and oxygen levels in different parts of your heart. For further information please visit HugeFiesta.[email or web address]. Please follow instruction sheet, as given.     Follow-Up: At [facility], you and your health needs are our priority.  As part of our continuing mission to provide you with exceptional heart care, we have created designated Provider Care Teams.  These Care Teams include your primary Cardiologist (physician) and Advanced Practice Providers (APPs -  Physician Assistants and Nurse Practitioners) who all work together to provide you with the care you need, when you need it.  We recommend signing up for the patient portal called "MyChart".  Sign up information is provided on this After Visit Summary.  MyChart is used to connect with patients for Virtual Visits  (Telemedicine).  Patients are able to view lab/test results, encounter notes, upcoming appointments, etc.  Non-urgent messages can be sent to your provider as well.   To learn more about what you can do with MyChart, go to [email or web address].    Your next appointment:   2 week(s)  The format for your next appointment:   In Person  Provider:   Eleonore Chiquito, MD   Other Instructions    [place] [address] Dept: [phone] Loc: [place]  09/07/2020  You are scheduled for a Cardiac Catheterization on Wednesday, January 26 with Dr. Sherren Mocha.  1. Please arrive at the [facility] (Main Entrance A) at [facility]: [address] [place], [place] [address] at 8:00 AM (This time is two hours before your procedure to ensure your preparation). Free valet parking service is available.   Special note: Every effort is made to have your procedure done on time. Please understand that emergencies sometimes delay scheduled procedures.  2. Diet: Do not eat solid foods after midnight.  The patient may have clear liquids until 5am upon the day of the procedure.  3. Labs: You will need to have blood drawn today- CBC, BMET   4. Medication instructions in preparation for your procedure:   Contrast Allergy: No  On the morning of your procedure, take your Aspirin and any morning medicines NOT listed above.  You may use sips of water.  5. Plan for one night stay--bring personal belongings. 6. Bring a  current list of your medications and current insurance cards. 7. You MUST have a responsible person to drive you home. 8. Someone MUST be with you the first 24 hours after you arrive home or your discharge will be delayed. 9. Please wear clothes that are easy to get on and off and wear slip-on shoes.  Thank you for allowing [place] to care for you!    -- [place] [facility]

## 2020-09-08 LAB — CBC
Hematocrit: 40.2 % (ref 37.5–51.0)
Hemoglobin: 13.9 g/dL (ref 13.0–17.7)
MCH: 29.4 pg (ref 26.6–33.0)
MCHC: 34.6 g/dL (ref 31.5–35.7)
MCV: 85 fL (ref 79–97)
Platelets: 159 10*3/uL (ref 150–450)
RBC: 4.72 x10E6/uL (ref 4.14–5.80)
RDW: 12.2 % (ref 11.6–15.4)
WBC: 5.1 10*3/uL (ref 3.4–10.8)

## 2020-09-08 LAB — BASIC METABOLIC PANEL
BUN/Creatinine Ratio: 22 (ref 10–24)
BUN: 21 mg/dL (ref 8–27)
CO2: 23 mmol/L (ref 20–29)
Calcium: 9.8 mg/dL (ref 8.6–10.2)
Chloride: 104 mmol/L (ref 96–106)
Creatinine, Ser: 0.96 mg/dL (ref 0.76–1.27)
GFR calc Af Amer: 86 mL/min/{1.73_m2} (ref >59)
GFR calc non Af Amer: 74 mL/min/{1.73_m2} (ref >59)
Glucose: 90 mg/dL (ref 65–99)
Potassium: 4.6 mmol/L (ref 3.5–5.2)
Sodium: 140 mmol/L (ref 134–144)

## 2020-09-10 ENCOUNTER — Other Ambulatory Visit (HOSPITAL_COMMUNITY)
Admission: RE | Admit: 2020-09-10 | Discharge: 2020-09-10 | Disposition: A | Discharge status: Home / Self Care | Payer: Medicare Other | Source: Ambulatory Visit | Attending: Cardiovascular Disease | Admitting: Cardiovascular Disease

## 2020-09-10 DIAGNOSIS — Z20822 Contact with and (suspected) exposure to covid-19: Secondary | ICD-10-CM | POA: Diagnosis not present

## 2020-09-10 DIAGNOSIS — Z01812 Encounter for preprocedural laboratory examination: Secondary | ICD-10-CM | POA: Insufficient documentation

## 2020-09-10 LAB — SARS CORONAVIRUS 2 (TAT 6-24 HRS): SARS Coronavirus 2: NEGATIVE

## 2020-09-11 ENCOUNTER — Telehealth: Payer: Self-pay | Admitting: *Deleted

## 2020-09-11 NOTE — Telephone Encounter (Signed)
Pt contacted pre-catheterization scheduled at [facility] for: Wednesday September 12, 2020 10 AM Verified arrival time and place: [facility]) at: 8 AM   No solid food after midnight prior to cath, clear liquids until 5 AM day of procedure.   AM meds can be  taken pre-cath with sips of water including: ASA 81 mg   Confirmed patient has responsible adult to drive home post procedure and be with patient first 24 hours after arriving home: yes  You are allowed ONE visitor in the waiting room during the time you are at the hospital for your procedure. Both you and your visitor must wear a mask once you enter the hospital.   Reviewed procedure/mask/visitor instructions reviewed with patient.

## 2020-09-12 ENCOUNTER — Encounter (HOSPITAL_COMMUNITY)
Admission: RE | Disposition: A | Discharge status: Home / Self Care | Payer: Self-pay | Source: Home / Self Care | Attending: Cardiovascular Disease

## 2020-09-12 ENCOUNTER — Other Ambulatory Visit: Payer: Self-pay

## 2020-09-12 ENCOUNTER — Ambulatory Visit (HOSPITAL_COMMUNITY)
Admission: RE | Admit: 2020-09-12 | Discharge: 2020-09-12 | Disposition: A | Discharge status: Home / Self Care | Payer: Medicare Other | Attending: Cardiovascular Disease | Admitting: Cardiovascular Disease

## 2020-09-12 DIAGNOSIS — R9439 Abnormal result of other cardiovascular function study: Secondary | ICD-10-CM

## 2020-09-12 DIAGNOSIS — Z79899 Other long term (current) drug therapy: Secondary | ICD-10-CM | POA: Diagnosis not present

## 2020-09-12 DIAGNOSIS — Z7982 Long term (current) use of aspirin: Secondary | ICD-10-CM | POA: Insufficient documentation

## 2020-09-12 DIAGNOSIS — E782 Mixed hyperlipidemia: Secondary | ICD-10-CM | POA: Diagnosis not present

## 2020-09-12 DIAGNOSIS — I2510 Atherosclerotic heart disease of native coronary artery without angina pectoris: Secondary | ICD-10-CM | POA: Diagnosis not present

## 2020-09-12 DIAGNOSIS — R0602 Shortness of breath: Secondary | ICD-10-CM | POA: Insufficient documentation

## 2020-09-12 DIAGNOSIS — I080 Rheumatic disorders of both mitral and aortic valves: Secondary | ICD-10-CM | POA: Diagnosis not present

## 2020-09-12 DIAGNOSIS — R931 Abnormal findings on diagnostic imaging of heart and coronary circulation: Secondary | ICD-10-CM | POA: Diagnosis present

## 2020-09-12 HISTORY — PX: LEFT HEART CATH AND CORONARY ANGIOGRAPHY: CATH118249

## 2020-09-12 SURGERY — LEFT HEART CATH AND CORONARY ANGIOGRAPHY
Anesthesia: LOCAL

## 2020-09-12 MED ORDER — HEPARIN (PORCINE) IN NACL 1000-0.9 UT/500ML-% IV SOLN
INTRAVENOUS | Status: DC | PRN
  Administered 2020-09-12 (×2): 500 mL

## 2020-09-12 MED ORDER — MIDAZOLAM HCL 2 MG/2ML IJ SOLN
INTRAMUSCULAR | Status: DC | PRN
  Administered 2020-09-12: 1 mg via INTRAVENOUS

## 2020-09-12 MED ORDER — VERAPAMIL HCL 2.5 MG/ML IV SOLN
INTRAVENOUS | Status: DC | PRN
  Administered 2020-09-12: 10 mL via INTRA_ARTERIAL

## 2020-09-12 MED ORDER — ASPIRIN 81 MG PO CHEW: 81.0000 mg | CHEWABLE_TABLET | ORAL | Status: DC

## 2020-09-12 MED ORDER — SODIUM CHLORIDE 0.9 % WEIGHT BASED INFUSION
3.0000 mL/kg/h | INTRAVENOUS | Status: AC
  Administered 2020-09-12: 3 mL/kg/h via INTRAVENOUS

## 2020-09-12 MED ORDER — SODIUM CHLORIDE 0.9% FLUSH: 3.0000 mL | INTRAVENOUS | Status: DC | PRN

## 2020-09-12 MED ORDER — ACETAMINOPHEN 325 MG PO TABS: 650.0000 mg | ORAL_TABLET | ORAL | Status: DC | PRN

## 2020-09-12 MED ORDER — FENTANYL CITRATE (PF) 100 MCG/2ML IJ SOLN
INTRAMUSCULAR | Status: DC | PRN
  Administered 2020-09-12: 25 ug via INTRAVENOUS

## 2020-09-12 MED ORDER — LIDOCAINE HCL (PF) 1 % IJ SOLN
INTRAMUSCULAR | Status: DC | PRN
  Administered 2020-09-12: 2 mL

## 2020-09-12 MED ORDER — HYDRALAZINE HCL 20 MG/ML IJ SOLN: 10.0000 mg | INTRAMUSCULAR | Status: DC | PRN

## 2020-09-12 MED ORDER — LIDOCAINE HCL (PF) 1 % IJ SOLN
INTRAMUSCULAR | Status: AC
  Filled 2020-09-12: qty 30

## 2020-09-12 MED ORDER — SODIUM CHLORIDE 0.9 % WEIGHT BASED INFUSION: 1.0000 mL/kg/h | INTRAVENOUS | Status: DC

## 2020-09-12 MED ORDER — LABETALOL HCL 5 MG/ML IV SOLN: 10.0000 mg | INTRAVENOUS | Status: DC | PRN

## 2020-09-12 MED ORDER — MIDAZOLAM HCL 2 MG/2ML IJ SOLN
INTRAMUSCULAR | Status: AC
  Filled 2020-09-12: qty 2

## 2020-09-12 MED ORDER — SODIUM CHLORIDE 0.9 % IV SOLN: 250.0000 mL | INTRAVENOUS | Status: DC | PRN

## 2020-09-12 MED ORDER — VERAPAMIL HCL 2.5 MG/ML IV SOLN
INTRAVENOUS | Status: AC
  Filled 2020-09-12: qty 2

## 2020-09-12 MED ORDER — FENTANYL CITRATE (PF) 100 MCG/2ML IJ SOLN
INTRAMUSCULAR | Status: AC
  Filled 2020-09-12: qty 2

## 2020-09-12 MED ORDER — ONDANSETRON HCL 4 MG/2ML IJ SOLN: 4.0000 mg | Freq: Four times a day (QID) | INTRAMUSCULAR | Status: DC | PRN

## 2020-09-12 MED ORDER — HEPARIN SODIUM (PORCINE) 1000 UNIT/ML IJ SOLN
INTRAMUSCULAR | Status: DC | PRN
  Administered 2020-09-12: 4000 [IU] via INTRAVENOUS

## 2020-09-12 MED ORDER — IOHEXOL 350 MG/ML SOLN
INTRAVENOUS | Status: DC | PRN
  Administered 2020-09-12: 45 mL

## 2020-09-12 MED ORDER — SODIUM CHLORIDE 0.9% FLUSH: 3.0000 mL | Freq: Two times a day (BID) | INTRAVENOUS | Status: DC

## 2020-09-12 MED ORDER — HEPARIN SODIUM (PORCINE) 1000 UNIT/ML IJ SOLN
INTRAMUSCULAR | Status: AC
  Filled 2020-09-12: qty 1

## 2020-09-12 SURGICAL SUPPLY — 11 items
BAG SNAP BAND KOVER 36X36 (MISCELLANEOUS) ×1 IMPLANT
CATH 5FR JL3.5 JR4 ANG PIG MP (CATHETERS) ×1 IMPLANT
COVER DOME SNAP 22 D (MISCELLANEOUS) ×1 IMPLANT
DEVICE RAD COMP TR BAND LRG (VASCULAR PRODUCTS) ×1 IMPLANT
GLIDESHEATH SLEND SS 6F .021 (SHEATH) ×1 IMPLANT
GUIDEWIRE INQWIRE 1.5J.035X260 (WIRE) IMPLANT
INQWIRE 1.5J .035X260CM (WIRE) ×2
KIT HEART LEFT (KITS) ×2 IMPLANT
PACK CARDIAC CATHETERIZATION (CUSTOM PROCEDURE TRAY) ×2 IMPLANT
TRANSDUCER W/STOPCOCK (MISCELLANEOUS) ×2 IMPLANT
TUBING CIL FLEX 10 FLL-RA (TUBING) ×2 IMPLANT

## 2020-09-12 NOTE — Progress Notes (Signed)
Patient was given discharge instructions. He verbalized understanding. 

## 2020-09-12 NOTE — Interval H&P Note (Signed)
Cath Lab Visit (complete for each Cath Lab visit)  Clinical Evaluation Leading to the Procedure:   ACS: No.  Non-ACS:    Anginal Classification: CCS II  Anti-ischemic medical therapy: No Therapy  Non-Invasive Test Results: Low-risk stress test findings: cardiac mortality <1%/year  Prior CABG: No previous CABG      History and Physical Interval Note:  09/12/2020 12:49 PM  Gerald Spears  has presented today for surgery, with the diagnosis of abnormal stress test.  The various methods of treatment have been discussed with the patient and family. After consideration of risks, benefits and other options for treatment, the patient has consented to  Procedure(s): LEFT HEART CATH AND CORONARY ANGIOGRAPHY (N/A) as a surgical intervention.  The patient's history has been reviewed, patient examined, no change in status, stable for surgery.  I have reviewed the patient's chart and labs.  Questions were answered to the patient's satisfaction.     Sherren Mocha

## 2020-09-12 NOTE — Discharge Instructions (Signed)
Drink plenty of fluid for 48 hours and keep wrist elevated at heart level for 24 hours  Radial Site Care   This sheet gives you information about how to care for yourself after your procedure. Your health care provider may also give you more specific instructions. If you have problems or questions, contact your health care provider. What can I expect after the procedure? After the procedure, it is common to have:  Bruising and tenderness at the catheter insertion area. Follow these instructions at home: Medicines  Take over-the-counter and prescription medicines only as told by your health care provider. Insertion site care 1. Follow instructions from your health care provider about how to take care of your insertion site. Make sure you: ? Wash your hands with soap and water before you change your bandage (dressing). If soap and water are not available, use hand sanitizer. ? remove your dressing as told by your health care provider. In 24 hours 2. Check your insertion site every day for signs of infection. Check for: ? Redness, swelling, or pain. ? Fluid or blood. ? Pus or a bad smell. ? Warmth. 3. Do not take baths, swim, or use a hot tub until your health care provider approves. 4. You may shower 24-48 hours after the procedure, or as directed by your health care provider. ? Remove the dressing and gently wash the site with plain soap and water. ? Pat the area dry with a clean towel. ? Do not rub the site. That could cause bleeding. 5. Do not apply powder or lotion to the site. Activity   1. For 24 hours after the procedure, or as directed by your health care provider: ? Do not flex or bend the affected arm. ? Do not push or pull heavy objects with the affected arm. ? Do not drive yourself home from the hospital or clinic. You may drive 24 hours after the procedure unless your health care provider tells you not to. ? Do not operate machinery or power tools. 2. Do not lift  anything that is heavier than 10 lb (4.5 kg), or the limit that you are told, until your health care provider says that it is safe. For 4 days 3. Ask your health care provider when it is okay to: ? Return to work or school. ? Resume usual physical activities or sports. ? Resume sexual activity. General instructions  If the catheter site starts to bleed, raise your arm and put firm pressure on the site. If the bleeding does not stop, get help right away. This is a medical emergency.  If you went home on the same day as your procedure, a responsible adult should be with you for the first 24 hours after you arrive home.  Keep all follow-up visits as told by your health care provider. This is important. Contact a health care provider if:  You have a fever.  You have redness, swelling, or yellow drainage around your insertion site. Get help right away if:  You have unusual pain at the radial site.  The catheter insertion area swells very fast.  The insertion area is bleeding, and the bleeding does not stop when you hold steady pressure on the area.  Your arm or hand becomes pale, cool, tingly, or numb. These symptoms may represent a serious problem that is an emergency. Do not wait to see if the symptoms will go away. Get medical help right away. Call your local emergency services (911 in the [place] Do not   drive yourself to the hospital. Summary  After the procedure, it is common to have bruising and tenderness at the site.  Follow instructions from your health care provider about how to take care of your radial site wound. Check the wound every day for signs of infection.  Do not lift anything that is heavier than 10 lb (4.5 kg), or the limit that you are told, until your health care provider says that it is safe. This information is not intended to replace advice given to you by your health care provider. Make sure you discuss any questions you have with your health care  provider. Document Revised: 09/09/2017 Document Reviewed: 09/09/2017 Elsevier Patient Education  2020 [employer]  

## 2020-09-13 ENCOUNTER — Encounter (HOSPITAL_COMMUNITY): Payer: Self-pay | Admitting: Cardiovascular Disease

## 2020-09-13 DIAGNOSIS — R0602 Shortness of breath: Secondary | ICD-10-CM | POA: Diagnosis not present

## 2020-09-13 DIAGNOSIS — I491 Atrial premature depolarization: Secondary | ICD-10-CM | POA: Diagnosis not present

## 2020-09-18 NOTE — Progress Notes (Signed)
Cardiology Office Note:   Date:  09/20/2020  NAME:  NIXXON FARIA    MRN: [id number] DOB:  [birth year]   PCP:  [name], MD  Cardiologist:  Evalina Field, MD  Electrophysiologist:  None   Referring MD: [name], MD   Chief Complaint  Patient presents with  . Coronary Artery Disease    History of Present Illness:   Gerald Spears is a 79 y.o. male with a hx of CAD, mild AS, MR, PACs, who presents for follow-up. LHC showed 80% stenosis in second diagonal and PCI deferred. Recent monitor with 5.8% PAC burden.  He reports he is doing well since his heart catheterization.  He is relieved to know that he has no significant blockages that require stents.  He is having no chest pain.  He is still getting short of breath but he reports he feels confident to be more active.  No palpitations reported.  He is not feeling any of his PACs.  His blood pressure is 117/62.  He was started on aspirin 81 mg daily.  He is also on Crestor 20 mg a day.  Tolerating this well.  Most recent LDL cholesterol 106.  I suspect he will get to goal on 20 mg of Crestor.  He denies any significant chest pain symptoms.  EKG in office demonstrates sinus rhythm with a single PAC.  Problem List 1. Aortic stenosis -mild 09/27/2018 -Vmax 2.7 m/s, MG 16 mmHG, AVA 1.46 2. Mitral regurgitation, moderate -09/27/2018 3. CAD -CAC score 682 (62nd percentile) -Apical ischemia 08/29/2020 -LHC 09/12/2020: D2 80% 4. HLD -T chol 180, LDL 106, HDL 61, TG 71 5. PACs -5.8% burden -brief ectopic atrial tachycardia   Past Medical History: Past Medical History:  Diagnosis Date  . Cancer [facility])    colon polyp  . Complication of anesthesia    difficult to wake up after surgery  . Coronary artery disease   . DJD (degenerative joint disease)    Dr. Noemi Chapel end stages of DJD 2011  . Hard of hearing   . Urinary frequency   . UTI (lower urinary tract infection)     Past Surgical History: Past Surgical History:   Procedure Laterality Date  . ANKLE SURGERY Right   . COLONOSCOPY W/ BIOPSIES AND POLYPECTOMY    . ELBOW SURGERY Right   . EYE SURGERY    . HERNIA REPAIR    . JOINT REPLACEMENT    . KNEE SURGERY Right    x4  . LEFT HEART CATH AND CORONARY ANGIOGRAPHY N/A 09/12/2020   Procedure: LEFT HEART CATH AND CORONARY ANGIOGRAPHY;  Surgeon: Sherren Mocha, MD;  Location: [facility];  Service: Cardiovascular;  Laterality: N/A;  . TOTAL KNEE ARTHROPLASTY Right   . TOTAL KNEE ARTHROPLASTY Left 06/18/2015   Procedure: TOTAL KNEE ARTHROPLASTY;  Surgeon: Frederik Pear, MD;  Location: [facility];  Service: Orthopedics;  Laterality: Left;    Current Medications: Current Meds  Medication Sig  . acetaminophen (TYLENOL) 650 MG CR tablet Take 650 mg by mouth at bedtime.  Marland Kitchen aspirin EC 81 MG tablet Take 1 tablet (81 mg total) by mouth daily. Swallow whole.  Marland Kitchen CANNABIDIOL PO Take 25 mg by mouth daily as needed (pain.). CBD softgel  . Cholecalciferol (VITAMIN D3) 50 MCG (2000 UT) TABS Take 2,000 Units by mouth daily.  Marland Kitchen doxycycline (VIBRA-TABS) 100 MG tablet Take 100 mg by mouth at bedtime.  . finasteride (PROSCAR) 5 MG tablet Take 5 mg by mouth daily  at 12 noon.  . rosuvastatin (CRESTOR) 20 MG tablet Take 1 tablet (20 mg total) by mouth daily.  . Tamsulosin HCl (FLOMAX) 0.4 MG CAPS Take 0.4 mg by mouth daily.  . valACYclovir (VALTREX) 500 MG tablet Take 500 mg by mouth 2 (two) times daily as needed (Cold sore).   . vitamin B-12 (CYANOCOBALAMIN) 100 MCG tablet Take 100 mcg by mouth every other day.     Allergies:    Patient has no known allergies.   Social History: Social History   Socioeconomic History  . Marital status: Married    Spouse name: Not on file  . Number of children: 4  . Years of education: Not on file  . Highest education level: Not on file  Occupational History  . 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 Smoking status: Never Smoker  . Smokeless tobacco: Never Used   Substance and Sexual Activity  . Alcohol use: No  . Drug use: No  . Sexual activity: Not on file  Other Topics Concern  . Not on file  Social History Narrative  . Not on file   Social Determinants of Health   Financial Resource Strain: Not on file  Food Insecurity: Not on file  Transportation Needs: Not on file  Physical Activity: Not on file  Stress: Not on file  Social Connections: Not on file     Family History: The patient's family history includes Breast cancer in his sister and sister; Heart attack in his mother; Heart disease in his father.  ROS:   All other ROS reviewed and negative. Pertinent positives noted in the HPI.     EKGs/Labs/Other Studies Reviewed:   The following studies were personally reviewed by me today:  EKG:  EKG is ordered today.  The ekg ordered today demonstrates sinus bradycardia heart rate 52, no acute ischemic changes, no evidence of infarction, and was personally reviewed by me.   LHC 09/12/2020  1. Left dominant coronary anatomy 2. Patent left main 3. Patent LAD with no significant stenosis, but severe second diagonal branch stenosis 4. Moderate OM stenosis, no tight stenosis in LCx distribution 5. Patent, nondominant RCA 6. Normal LVEDP  Recommend: medical therapy, branch vessel CAD in patient without angina. Low risk nuclear stress test but nuclear defect fits coronary stenosis in second diagonal. PCI technically feasible if patient fails med Rx.   Recent Labs: 08/23/2020: BNP 98.0; TSH 3.490 09/07/2020: BUN 21; Creatinine, Ser 0.96; Hemoglobin 13.9; Platelets 159; Potassium 4.6; Sodium 140   Recent Lipid Panel No results found for: CHOL, TRIG, HDL, CHOLHDL, VLDL, LDLCALC, LDLDIRECT  Physical Exam:   VS:  BP 117/62   Pulse (!) 50   Ht 5\' 11"  (1.803 m)   Wt 187 lb 3.2 oz (84.9 kg)   SpO2 97%   BMI 26.11 kg/m    Wt Readings from Last 3 Encounters:  09/20/20 187 lb 3.2 oz (84.9 kg)  09/12/20 180 lb (81.6 kg)  09/07/20 187 lb  (84.8 kg)    General: Well nourished, well developed, in no acute distress Head: Atraumatic, normal size  Eyes: PEERLA, EOMI  Neck: Supple, no JVD Endocrine: No thryomegaly Cardiac: Normal S1, S2; RRR; no murmurs, rubs, or gallops Lungs: Clear to auscultation bilaterally, no wheezing, rhonchi or rales  Abd: Soft, nontender, no hepatomegaly  Ext: No edema, pulses 2+ Musculoskeletal: No deformities, BUE and BLE strength normal and equal Skin: Warm and dry, no rashes   Neuro: Alert and oriented to person, place, time,  and situation, CNII-XII grossly intact, no focal deficits  Psych: Normal mood and affect   ASSESSMENT:   Gerald Spears is a 80 y.o. male who presents for the following: 1. Coronary artery disease involving native coronary artery of native heart without angina pectoris   2. Mixed hyperlipidemia   3. PAC (premature atrial contraction)   4. Atrial tachycardia ([place])   5. SOB (shortness of breath)   6. Aortic valve stenosis, etiology of cardiac valve disease unspecified   7. Nonrheumatic mitral valve regurgitation     PLAN:   1. Coronary artery disease involving native coronary artery of native heart without angina pectoris 2. Mixed hyperlipidemia -Abnormal stress test.  Shortness of breath.  Left heart catheterization pursued.  Obstructive disease in the second diagonal branch.  Medical management was opted for as he had no symptoms of angina.  This does not explain his shortness of breath.  He will continue aspirin 81 mg a day.  He also will continue Crestor 20 mg daily.  Most recent LDL 106.  Goal LDL less than 70.  I think you will get there on Crestor.  We will review labs yearly with him.  3. PAC (premature atrial contraction) 4. Atrial tachycardia ([place]) -Brief atrial tachycardia cardia episodes.  5.8% PAC burden.  No symptoms from this.  No need for treatment at this time.  He will keep an eye on things.  5. SOB (shortness of breath) -He was having shortness of  breath.  Left heart cath pursued.  Single-vessel CAD in a small diagonal branch.  Does not explain shortness of breath.  Medical management as above.  6. Aortic valve stenosis, etiology of cardiac valve disease unspecified -Mild aortic stenosis.  Follow-up echocardiogram in 3 to 5 years.  He will see me yearly.  7. Nonrheumatic mitral valve regurgitation -Mild to moderate.  Periodic echocardiogram as above.  Disposition: Return in about 1 year (around 09/20/2021).  Medication Adjustments/Labs and Tests Ordered: Current medicines are reviewed at length with the patient today.  Concerns regarding medicines are outlined above.  Orders Placed This Encounter  Procedures  . EKG 12-Lead   No orders of the defined types were placed in this encounter.   Patient Instructions  Medication Instructions:  The current medical regimen is effective;  continue present plan and medications.  *If you need a refill on your cardiac medications before your next appointment, please call your pharmacy*   Follow-Up: At [facility], you and your health needs are our priority.  As part of our continuing mission to provide you with exceptional heart care, we have created designated Provider Care Teams.  These Care Teams include your primary Cardiologist (physician) and Advanced Practice Providers (APPs -  Physician Assistants and Nurse Practitioners) who all work together to provide you with the care you need, when you need it.  We recommend signing up for the patient portal called "MyChart".  Sign up information is provided on this After Visit Summary.  MyChart is used to connect with patients for Virtual Visits (Telemedicine).  Patients are able to view lab/test results, encounter notes, upcoming appointments, etc.  Non-urgent messages can be sent to your provider as well.   To learn more about what you can do with MyChart, go to [email or web address].    Your next appointment:   12 month(s)  The format  for your next appointment:   In Person  Provider:   Eleonore Chiquito, MD       Time Spent with Patient:  I have spent a total of 25 minutes with patient reviewing hospital notes, telemetry, EKGs, labs and examining the patient as well as establishing an assessment and plan that was discussed with the patient.  > 50% of time was spent in direct patient care.  Signed, Addison Naegeli. Audie Box, [place]  [address], [place], [place] [address] [phone]  09/20/2020 8:40 AM

## 2020-09-20 ENCOUNTER — Ambulatory Visit (INDEPENDENT_AMBULATORY_CARE_PROVIDER_SITE_OTHER): Payer: Medicare Other | Admitting: Cardiovascular Disease

## 2020-09-20 ENCOUNTER — Other Ambulatory Visit: Payer: Self-pay

## 2020-09-20 ENCOUNTER — Encounter: Payer: Self-pay | Admitting: Cardiovascular Disease

## 2020-09-20 VITALS — BP 117/62 | HR 50 | Ht 71.0 in | Wt 187.2 lb

## 2020-09-20 DIAGNOSIS — I340 Nonrheumatic mitral (valve) insufficiency: Secondary | ICD-10-CM

## 2020-09-20 DIAGNOSIS — E782 Mixed hyperlipidemia: Secondary | ICD-10-CM

## 2020-09-20 DIAGNOSIS — I2510 Atherosclerotic heart disease of native coronary artery without angina pectoris: Secondary | ICD-10-CM | POA: Diagnosis not present

## 2020-09-20 DIAGNOSIS — I350 Nonrheumatic aortic (valve) stenosis: Secondary | ICD-10-CM

## 2020-09-20 DIAGNOSIS — I471 Supraventricular tachycardia: Secondary | ICD-10-CM

## 2020-09-20 DIAGNOSIS — I491 Atrial premature depolarization: Secondary | ICD-10-CM

## 2020-09-20 DIAGNOSIS — R0602 Shortness of breath: Secondary | ICD-10-CM | POA: Diagnosis not present

## 2020-09-20 NOTE — Patient Instructions (Signed)

## 2020-09-28 DIAGNOSIS — L814 Other melanin hyperpigmentation: Secondary | ICD-10-CM | POA: Diagnosis not present

## 2020-09-28 DIAGNOSIS — D1801 Hemangioma of skin and subcutaneous tissue: Secondary | ICD-10-CM | POA: Diagnosis not present

## 2020-09-28 DIAGNOSIS — L570 Actinic keratosis: Secondary | ICD-10-CM | POA: Diagnosis not present

## 2020-09-28 DIAGNOSIS — Z86006 Personal history of melanoma in-situ: Secondary | ICD-10-CM | POA: Diagnosis not present

## 2020-10-02 DIAGNOSIS — H903 Sensorineural hearing loss, bilateral: Secondary | ICD-10-CM | POA: Diagnosis not present

## 2020-10-15 MED ORDER — ATORVASTATIN CALCIUM 10 MG PO TABS: 10.0000 mg | ORAL_TABLET | Freq: Every day | ORAL | 3 refills | Status: DC

## 2020-10-26 DIAGNOSIS — N401 Enlarged prostate with lower urinary tract symptoms: Secondary | ICD-10-CM | POA: Diagnosis not present

## 2020-10-26 DIAGNOSIS — R3915 Urgency of urination: Secondary | ICD-10-CM | POA: Diagnosis not present

## 2020-10-26 DIAGNOSIS — R310 Gross hematuria: Secondary | ICD-10-CM | POA: Diagnosis not present

## 2020-11-22 DIAGNOSIS — L570 Actinic keratosis: Secondary | ICD-10-CM | POA: Diagnosis not present

## 2020-12-21 ENCOUNTER — Ambulatory Visit: Payer: Medicare Other | Admitting: Cardiovascular Disease

## 2021-01-08 DIAGNOSIS — Z96651 Presence of right artificial knee joint: Secondary | ICD-10-CM | POA: Diagnosis not present

## 2021-01-08 DIAGNOSIS — M25561 Pain in right knee: Secondary | ICD-10-CM | POA: Diagnosis not present

## 2021-01-17 DIAGNOSIS — G8929 Other chronic pain: Secondary | ICD-10-CM | POA: Diagnosis not present

## 2021-01-17 DIAGNOSIS — Z96652 Presence of left artificial knee joint: Secondary | ICD-10-CM | POA: Diagnosis not present

## 2021-01-17 DIAGNOSIS — M25562 Pain in left knee: Secondary | ICD-10-CM | POA: Diagnosis not present

## 2021-01-17 DIAGNOSIS — T84018A Broken internal joint prosthesis, other site, initial encounter: Secondary | ICD-10-CM | POA: Diagnosis not present

## 2021-01-25 ENCOUNTER — Other Ambulatory Visit: Payer: Self-pay | Admitting: Orthopedic Surgery

## 2021-01-25 ENCOUNTER — Telehealth (INDEPENDENT_AMBULATORY_CARE_PROVIDER_SITE_OTHER): Payer: Medicare Other | Admitting: Cardiovascular Disease

## 2021-01-25 DIAGNOSIS — M25561 Pain in right knee: Secondary | ICD-10-CM | POA: Diagnosis not present

## 2021-01-25 DIAGNOSIS — Z471 Aftercare following joint replacement surgery: Secondary | ICD-10-CM | POA: Diagnosis not present

## 2021-01-25 DIAGNOSIS — E782 Mixed hyperlipidemia: Secondary | ICD-10-CM

## 2021-01-25 DIAGNOSIS — Z01818 Encounter for other preprocedural examination: Secondary | ICD-10-CM

## 2021-01-25 DIAGNOSIS — Z96651 Presence of right artificial knee joint: Secondary | ICD-10-CM | POA: Diagnosis not present

## 2021-01-25 NOTE — Telephone Encounter (Signed)
   Primary Cardiologist: Evalina Field, MD  Chart reviewed as part of pre-operative protocol coverage. Given past medical history and time since last visit, based on ACC/AHA guidelines, Gerald Spears would be at acceptable risk for the planned procedure without further cardiovascular testing.   His RCRI is a class III risk, 6.6% risk of major cardiac event.  He is able to complete greater than 4 METS of physical activity.  Patient was advised that if he develops new symptoms prior to surgery to contact our office to arrange a follow-up appointment.  He verbalized understanding.  I will route this recommendation to the requesting party via Epic fax function and remove from pre-op pool.  Please call with questions.  Jossie Ng. Diann Bangerter NP-C    01/25/2021, 9:21 AM [place] [address] Office [phone] Fax [phone]

## 2021-01-25 NOTE — Telephone Encounter (Signed)
   [facility] Pre-operative Risk Assessment    Patient Name: Gerald Spears  DOB: Jul 10, 1941  MRN: [id number]   HEARTCARE STAFF: - Please ensure there is not already an duplicate clearance open for this procedure. - Under Visit Info/Reason for Call, type in Other and utilize the format Clearance MM/DD/YY or Clearance TBD. Do not use dashes or single digits. - If request is for dental extraction, please clarify the # of teeth to be extracted. - If the patient is currently at the dentist's office, call Pre-Op APP to address. If the patient is not currently in the dentist office, please route to the Pre-Op pool  Request for surgical clearance:  What type of surgery is being performed? Vision of the left knee replacement    When is this surgery scheduled? TBA    What type of clearance is required (medical clearance vs. Pharmacy clearance to hold med vs. Both)? Medical and Pharmacy   Are there any medications that need to be held prior to surgery and how long? If he is on a blood thinner yes    Practice name and name of physician performing surgery? [facility] / Dr. Frederik Pear    What is the office phone number? [phone]   7.   What is the office fax number? [phone]  8.   Anesthesia type (None, local, MAC, general) ? Spinal    Kamira J Martinique 01/25/2021, 8:45 AM  _________________________________________________________________   (provider comments below)

## 2021-01-28 DIAGNOSIS — H2513 Age-related nuclear cataract, bilateral: Secondary | ICD-10-CM | POA: Diagnosis not present

## 2021-01-28 DIAGNOSIS — D3132 Benign neoplasm of left choroid: Secondary | ICD-10-CM | POA: Diagnosis not present

## 2021-01-28 NOTE — Progress Notes (Addendum)
COVID Vaccine Completed: x3 Date COVID Vaccine completed: 11/03/19, 11/24/19, 08/01/20 Has received booster: yes x1 COVID vaccine manufacturer: [employer]      Date of COVID positive in last 90 days: N/A  PCP - [name], MD Cardiologist - Geralynn Rile, MD  Chest x-ray - 02/04/21 Epic EKG - 09/20/20 Epic Stress Test - 08/29/20 Epic ECHO - 09/27/18 Epic Cardiac Cath - 09/12/20 Epic Zio monitor- 08/23/20-09/16/20 Epic Pacemaker/ICD device last checked: Spinal Cord Stimulator: CT Cardiac Score - 08-30-20 Epic  Cardiac Clearance in note dated 01/25/21 by Coletta Memos, NP-C  Sleep Study - N/A CPAP -   Fasting Blood Sugar - N/A Checks Blood Sugar _____ times a day  Blood Thinner Instructions: Stop 1 week before Aspirin Instructions:  ASA 81 mg  Last Dose:  Activity level: Can go up a flight of stairs and perform activities of daily living without stopping and without symptoms of chest pain or shortness of breath.    Anesthesia review: aortic stenosis, mitral valve regurgitation, SOB, PACs,   Patient denies shortness of breath, fever, cough and chest pain at PAT appointment   Patient verbalized understanding of instructions that were given to them at the PAT appointment. Patient was also instructed that they will need to review over the PAT instructions again at home before surgery.

## 2021-01-28 NOTE — Patient Instructions (Addendum)
DUE TO COVID-19 ONLY ONE VISITOR IS ALLOWED TO COME WITH YOU AND STAY IN THE WAITING ROOM ONLY DURING PRE OP AND PROCEDURE.   **NO VISITORS ARE ALLOWED IN THE SHORT STAY AREA OR RECOVERY ROOM!!**  IF YOU WILL BE ADMITTED INTO THE HOSPITAL YOU ARE ALLOWED ONLY TWO SUPPORT PEOPLE DURING VISITATION HOURS ONLY (10AM -8PM)   The support person(s) may change daily. The support person(s) must pass our screening, gel in and out, and wear a mask at all times, including in the patient's room. Patients must also wear a mask when staff or their support person are in the room.  No visitors under the age of 84. Any visitor under the age of 60 must be accompanied by an adult.    COVID SWAB TESTING MUST BE COMPLETED ON:  02/07/21 @ 9:00 AM   [address] [place], [place] [address]   You are not required to quarantine, however you are required to wear a well-fitted mask when you are out and around people not in your household.  Hand Hygiene often Do NOT share personal items Notify your provider if you are in close contact with someone who has COVID or you develop fever 100.4 or greater, new onset of sneezing, cough, sore throat, shortness of breath or body aches.       Your procedure is scheduled on: 02/11/21   Report to [facility] Main  Entrance    Report to admitting at 6:20 AM   Call this number if you have problems the morning of surgery [phone]   Do not eat food :After Midnight.   May have liquids until 5:50  AM day of surgery  CLEAR LIQUID DIET  Foods Allowed                                                                     Foods Excluded  Water, Black Coffee and tea, regular and decaf               liquids that you cannot  Plain Jell-O in any flavor  (No red)                                     see through such as: Fruit ices (not with fruit pulp)                                             milk, soups, orange juice              Iced Popsicles (No red)                                                  All solid food                                   Apple juices  Sports drinks like Gatorade (No red) Lightly seasoned clear broth or consume(fat free) Sugar, honey syrup     Complete one Ensure drink the morning of surgery at 5:50 AM the day of surgery.     The day of surgery:  Drink ONE (1) Pre-Surgery Clear Ensure by 7:30 AM the morning of surgery. Drink in one sitting. Do not sip.  This drink was given to you during your hospital  pre-op appointment visit. Nothing else to drink after completing the  Pre-Surgery Clear Ensure.          If you have questions, please contact your surgeon's office.     Oral Hygiene is also important to reduce your risk of infection.                                    Remember - BRUSH YOUR TEETH THE MORNING OF SURGERY WITH YOUR REGULAR TOOTHPASTE   Take these medicines the morning of surgery with A SIP OF WATER: Atorvastatin, Finasteride, Tamsulosin.                               You may not have any metal on your body including jewelry, and body piercing             Do not wear lotions, powders, cologne, or deodorant              Men may shave face and neck.   Do not bring valuables to the hospital. [place].   Contacts, dentures or bridgework may not be worn into surgery.   Bring small overnight bag day of surgery.  Special Instructions: Bring a copy of your healthcare power of attorney and living will documents         the day of surgery if you haven't scanned them in before.              Please read over the following fact sheets you were given: IF YOU HAVE QUESTIONS ABOUT YOUR PRE OP INSTRUCTIONS PLEASE CALL [phone]    - Preparing for Surgery Before surgery, you can play an important role.  Because skin is not sterile, your skin needs to be as free of germs as possible.  You can reduce the number of germs on your skin by washing with CHG  (chlorahexidine gluconate) soap before surgery.  CHG is an antiseptic cleaner which kills germs and bonds with the skin to continue killing germs even after washing. Please DO NOT use if you have an allergy to CHG or antibacterial soaps.  If your skin becomes reddened/irritated stop using the CHG and inform your nurse when you arrive at Short Stay. Do not shave (including legs and underarms) for at least 48 hours prior to the first CHG shower.  You may shave your face/neck.  Please follow these instructions carefully:  1.  Shower with CHG Soap the night before surgery and the  morning of surgery.  2.  If you choose to wash your hair, wash your hair first as usual with your normal  shampoo.  3.  After you shampoo, rinse your hair and body thoroughly to remove the shampoo.  4.  Use CHG as you would any other liquid soap.  You can apply chg directly to the skin and wash.  Gently with a scrungie or clean washcloth.  5.  Apply the CHG Soap to your body ONLY FROM THE NECK DOWN.   Do   not use on face/ open                           Wound or open sores. Avoid contact with eyes, ears mouth and   genitals (private parts).                       Wash face,  Genitals (private parts) with your normal soap.             6.  Wash thoroughly, paying special attention to the area where your    surgery  will be performed.  7.  Thoroughly rinse your body with warm water from the neck down.  8.  DO NOT shower/wash with your normal soap after using and rinsing off the CHG Soap.                9.  Pat yourself dry with a clean towel.            10.  Wear clean pajamas.            11.  Place clean sheets on your bed the night of your first shower and do not  sleep with pets. Day of Surgery : Do not apply any lotions/deodorants the morning of surgery.  Please wear clean clothes to the hospital/surgery center.  FAILURE TO FOLLOW THESE INSTRUCTIONS MAY RESULT IN THE CANCELLATION OF YOUR  SURGERY  PATIENT SIGNATURE_________________________________  NURSE SIGNATURE__________________________________  ________________________________________________________________________   Gerald Spears  An incentive spirometer is a tool that can help keep your lungs clear and active. This tool measures how well you are filling your lungs with each breath. Taking long deep breaths may help reverse or decrease the chance of developing breathing (pulmonary) problems (especially infection) following: A long period of time when you are unable to move or be active. BEFORE THE PROCEDURE  If the spirometer includes an indicator to show your best effort, your nurse or respiratory therapist will set it to a desired goal. If possible, sit up straight or lean slightly forward. Try not to slouch. Hold the incentive spirometer in an upright position. INSTRUCTIONS FOR USE  Sit on the edge of your bed if possible, or sit up as far as you can in bed or on a chair. Hold the incentive spirometer in an upright position. Breathe out normally. Place the mouthpiece in your mouth and seal your lips tightly around it. Breathe in slowly and as deeply as possible, raising the piston or the ball toward the top of the column. Hold your breath for 3-5 seconds or for as long as possible. Allow the piston or ball to fall to the bottom of the column. Remove the mouthpiece from your mouth and breathe out normally. Rest for a few seconds and repeat Steps 1 through 7 at least 10 times every 1-2 hours when you are awake. Take your time and take a few normal breaths between deep breaths. The spirometer may include an indicator to show your best effort. Use the indicator as a goal to work toward during each repetition. After each set of 10 deep breaths, practice coughing to be sure your  lungs are clear. If you have an incision (the cut made at the time of surgery), support your incision when coughing by placing a pillow or  rolled up towels firmly against it. Once you are able to get out of bed, walk around indoors and cough well. You may stop using the incentive spirometer when instructed by your caregiver.  RISKS AND COMPLICATIONS Take your time so you do not get dizzy or light-headed. If you are in pain, you may need to take or ask for pain medication before doing incentive spirometry. It is harder to take a deep breath if you are having pain. AFTER USE Rest and breathe slowly and easily. It can be helpful to keep track of a log of your progress. Your caregiver can provide you with a simple table to help with this. If you are using the spirometer at home, follow these instructions: [place] IF:  You are having difficultly using the spirometer. You have trouble using the spirometer as often as instructed. Your pain medication is not giving enough relief while using the spirometer. You develop fever of 100.5 F (38.1 C) or higher. SEEK IMMEDIATE MEDICAL CARE IF:  You cough up bloody sputum that had not been present before. You develop fever of 102 F (38.9 C) or greater. You develop worsening pain at or near the incision site. MAKE SURE YOU:  Understand these instructions. Will watch your condition. Will get help right away if you are not doing well or get worse. Document Released: 12/15/2006 Document Revised: 10/27/2011 Document Reviewed: 02/15/2007 Exit[employer] Patient Information 2014 [employer], [place].   ________________________________________________________________________    WHAT IS A BLOOD TRANSFUSION? Blood Transfusion Information  A transfusion is the replacement of blood or some of its parts. Blood is made up of multiple cells which provide different functions. Red blood cells carry oxygen and are used for blood loss replacement. White blood cells fight against infection. Platelets control bleeding. Plasma helps clot blood. Other blood products are available for specialized needs,  such as hemophilia or other clotting disorders. BEFORE THE TRANSFUSION  Who gives blood for transfusions?  Healthy volunteers who are fully evaluated to make sure their blood is safe. This is blood bank blood. Transfusion therapy is the safest it has ever been in the practice of medicine. Before blood is taken from a donor, a complete history is taken to make sure that person has no history of diseases nor engages in risky social behavior (examples are intravenous drug use or sexual activity with multiple partners). The donor's travel history is screened to minimize risk of transmitting infections, such as malaria. The donated blood is tested for signs of infectious diseases, such as HIV and hepatitis. The blood is then tested to be sure it is compatible with you in order to minimize the chance of a transfusion reaction. If you or a relative donates blood, this is often done in anticipation of surgery and is not appropriate for emergency situations. It takes many days to process the donated blood. RISKS AND COMPLICATIONS Although transfusion therapy is very safe and saves many lives, the main dangers of transfusion include:  Getting an infectious disease. Developing a transfusion reaction. This is an allergic reaction to something in the blood you were given. Every precaution is taken to prevent this. The decision to have a blood transfusion has been considered carefully by your caregiver before blood is given. Blood is not given unless the benefits outweigh the risks. AFTER THE TRANSFUSION Right after receiving a  blood transfusion, you will usually feel much better and more energetic. This is especially true if your red blood cells have gotten low (anemic). The transfusion raises the level of the red blood cells which carry oxygen, and this usually causes an energy increase. The nurse administering the transfusion will monitor you carefully for complications. HOME CARE INSTRUCTIONS  No special  instructions are needed after a transfusion. You may find your energy is better. Speak with your caregiver about any limitations on activity for underlying diseases you may have. SEEK MEDICAL CARE IF:  Your condition is not improving after your transfusion. You develop redness or irritation at the intravenous (IV) site. SEEK IMMEDIATE MEDICAL CARE IF:  Any of the following symptoms occur over the next 12 hours: Shaking chills. You have a temperature by mouth above 102 F (38.9 C), not controlled by medicine. Chest, back, or muscle pain. People around you feel you are not acting correctly or are confused. Shortness of breath or difficulty breathing. Dizziness and fainting. You get a rash or develop hives. You have a decrease in urine output. Your urine turns a dark color or changes to pink, red, or brown. Any of the following symptoms occur over the next 10 days: You have a temperature by mouth above 102 F (38.9 C), not controlled by medicine. Shortness of breath. Weakness after normal activity. The white part of the eye turns yellow (jaundice). You have a decrease in the amount of urine or are urinating less often. Your urine turns a dark color or changes to pink, red, or brown. Document Released: 08/01/2000 Document Revised: 10/27/2011 Document Reviewed: 03/20/2008 [facility] Patient Information 2014 [place], [place].  _______________________________________________________________________

## 2021-01-29 ENCOUNTER — Other Ambulatory Visit: Payer: Self-pay | Admitting: Orthopedic Surgery

## 2021-02-01 DIAGNOSIS — M1711 Unilateral primary osteoarthritis, right knee: Secondary | ICD-10-CM | POA: Diagnosis not present

## 2021-02-04 ENCOUNTER — Other Ambulatory Visit: Payer: Self-pay

## 2021-02-04 ENCOUNTER — Encounter (HOSPITAL_COMMUNITY)
Admission: RE | Admit: 2021-02-04 | Discharge: 2021-02-04 | Disposition: A | Discharge status: Home / Self Care | Payer: Medicare Other | Source: Ambulatory Visit | Attending: Orthopedic Surgery | Admitting: Orthopedic Surgery

## 2021-02-04 ENCOUNTER — Encounter (HOSPITAL_COMMUNITY): Payer: Self-pay

## 2021-02-04 ENCOUNTER — Ambulatory Visit (HOSPITAL_COMMUNITY)
Admission: RE | Admit: 2021-02-04 | Discharge: 2021-02-04 | Disposition: A | Discharge status: Home / Self Care | Payer: Medicare Other | Source: Ambulatory Visit | Attending: Orthopedic Surgery | Admitting: Orthopedic Surgery

## 2021-02-04 DIAGNOSIS — Z01818 Encounter for other preprocedural examination: Secondary | ICD-10-CM

## 2021-02-04 DIAGNOSIS — Z79899 Other long term (current) drug therapy: Secondary | ICD-10-CM | POA: Diagnosis not present

## 2021-02-04 DIAGNOSIS — I350 Nonrheumatic aortic (valve) stenosis: Secondary | ICD-10-CM | POA: Diagnosis not present

## 2021-02-04 DIAGNOSIS — Z7982 Long term (current) use of aspirin: Secondary | ICD-10-CM | POA: Diagnosis not present

## 2021-02-04 DIAGNOSIS — Q791 Other congenital malformations of diaphragm: Secondary | ICD-10-CM | POA: Diagnosis not present

## 2021-02-04 DIAGNOSIS — M47814 Spondylosis without myelopathy or radiculopathy, thoracic region: Secondary | ICD-10-CM | POA: Diagnosis not present

## 2021-02-04 DIAGNOSIS — Z85038 Personal history of other malignant neoplasm of large intestine: Secondary | ICD-10-CM | POA: Diagnosis not present

## 2021-02-04 LAB — BASIC METABOLIC PANEL
Anion gap: 7 (ref 5–15)
BUN: 19 mg/dL (ref 8–23)
CO2: 27 mmol/L (ref 22–32)
Calcium: 9.6 mg/dL (ref 8.9–10.3)
Chloride: 106 mmol/L (ref 98–111)
Creatinine, Ser: 0.96 mg/dL (ref 0.61–1.24)
GFR, Estimated: >60 mL/min (ref >60)
Glucose, Bld: 102 mg/dL — ABNORMAL HIGH (ref 70–99)
Potassium: 5.2 mmol/L — ABNORMAL HIGH (ref 3.5–5.1)
Sodium: 140 mmol/L (ref 135–145)

## 2021-02-04 LAB — URINALYSIS, ROUTINE W REFLEX MICROSCOPIC
Bilirubin Urine: NEGATIVE
Glucose, UA: NEGATIVE mg/dL
Hgb urine dipstick: NEGATIVE
Ketones, ur: NEGATIVE mg/dL
Leukocytes,Ua: NEGATIVE
Nitrite: NEGATIVE
Protein, ur: NEGATIVE mg/dL
Specific Gravity, Urine: 1.005 (ref 1.005–1.030)
pH: 6 (ref 5.0–8.0)

## 2021-02-04 LAB — CBC WITH DIFFERENTIAL/PLATELET
Abs Immature Granulocytes: 0.02 10*3/uL (ref 0.00–0.07)
Basophils Absolute: 0.1 10*3/uL (ref 0.0–0.1)
Basophils Relative: 1 %
Eosinophils Absolute: 0.3 10*3/uL (ref 0.0–0.5)
Eosinophils Relative: 5 %
HCT: 44.7 % (ref 39.0–52.0)
Hemoglobin: 15 g/dL (ref 13.0–17.0)
Immature Granulocytes: 0 %
Lymphocytes Relative: 21 %
Lymphs Abs: 1.1 10*3/uL (ref 0.7–4.0)
MCH: 30.7 pg (ref 26.0–34.0)
MCHC: 33.6 g/dL (ref 30.0–36.0)
MCV: 91.4 fL (ref 80.0–100.0)
Monocytes Absolute: 0.4 10*3/uL (ref 0.1–1.0)
Monocytes Relative: 7 %
Neutro Abs: 3.5 10*3/uL (ref 1.7–7.7)
Neutrophils Relative %: 66 %
Platelets: 144 10*3/uL — ABNORMAL LOW (ref 150–400)
RBC: 4.89 MIL/uL (ref 4.22–5.81)
RDW: 12.2 % (ref 11.5–15.5)
WBC: 5.3 10*3/uL (ref 4.0–10.5)
nRBC: 0 % (ref 0.0–0.2)

## 2021-02-04 LAB — PROTIME-INR
INR: 1 (ref 0.8–1.2)
Prothrombin Time: 13.5 seconds (ref 11.4–15.2)

## 2021-02-04 LAB — SURGICAL PCR SCREEN
MRSA, PCR: NEGATIVE
Staphylococcus aureus: NEGATIVE

## 2021-02-04 LAB — APTT: aPTT: 31 seconds (ref 24–36)

## 2021-02-04 NOTE — Progress Notes (Signed)
Left voicemail with Wells Guiles at Dr. Damita Dunnings office regarding site of surgery. Pt currently scheduled for left knee revision. But pt states it is supposed to be a right knee revision at PAT visit today.

## 2021-02-05 ENCOUNTER — Other Ambulatory Visit: Payer: Self-pay | Admitting: Orthopedic Surgery

## 2021-02-06 DIAGNOSIS — T84032A Mechanical loosening of internal right knee prosthetic joint, initial encounter: Secondary | ICD-10-CM | POA: Diagnosis present

## 2021-02-06 NOTE — H&P (Signed)
TOTAL KNEE REVISION ADMISSION H&P  Patient is being admitted for right revision total knee arthroplasty.  Subjective:  Chief Complaint:right knee pain.  HPI: Gerald Spears, 80 y.o. male, has a history of pain and functional disability in the right knee(s) due to failed previous arthroplasty and patient has failed non-surgical conservative treatments for greater than 12 weeks to include NSAID's and/or analgesics, flexibility and strengthening excercises, use of assistive devices, and activity modification. The indications for the revision of the total knee arthroplasty are loosening of one or more components. Onset of symptoms was gradual starting 2 years ago with gradually worsening course since that time.  Prior procedures on the right knee(s) include arthroplasty.  Patient currently rates pain in the right knee(s) at 10 out of 10 with activity. There is worsening of pain with activity and weight bearing, pain that interferes with activities of daily living, pain with passive range of motion, and joint swelling.  Patient has evidence of prosthetic loosening by imaging studies. This condition presents safety issues increasing the risk of falls.  There is no current active infection.  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 Noemi Chapel end stages of DJD 2011   Hard of hearing    Urinary frequency    UTI (lower urinary tract infection)     Past Surgical  History:  Procedure Laterality Date   ANKLE SURGERY Right    COLONOSCOPY W/ BIOPSIES AND POLYPECTOMY     ELBOW SURGERY Right    EYE SURGERY     HERNIA REPAIR     JOINT REPLACEMENT     KNEE SURGERY Right    x4   LEFT HEART CATH AND CORONARY ANGIOGRAPHY N/A 09/12/2020   Procedure: LEFT HEART CATH AND CORONARY ANGIOGRAPHY;  Surgeon: Sherren Mocha, MD;  Location: [facility];  Service: Cardiovascular;  Laterality: N/A;   MULTIPLE TOOTH EXTRACTIONS     TOTAL KNEE ARTHROPLASTY Right    TOTAL KNEE ARTHROPLASTY Left 06/18/2015   Procedure: TOTAL KNEE ARTHROPLASTY;  Surgeon: Frederik Pear, MD;  Location: [facility];  Service: Orthopedics;  Laterality: Left;    No current facility-administered medications for this encounter.   Current Outpatient Medications  Medication Sig Dispense Refill Last Dose   acetaminophen (TYLENOL) 650 MG CR tablet Take 650 mg by mouth every 8 (eight) hours as needed for pain.      amoxicillin (AMOXIL) 500 MG capsule Take 2,000 mg by mouth See admin instructions. Take 2000 mg 1 hour prior to dental work      aspirin EC 81 MG tablet Take 1 tablet (81 mg total) by mouth daily. Swallow whole. 90 tablet 3    atorvastatin (LIPITOR) 10 MG tablet Take 1 tablet (10 mg total) by mouth daily. (Patient taking differently: Take 10 mg by mouth every other day.) 90 tablet 3    CANNABIDIOL PO Take 25 mg by mouth daily. CBD softgel  Cholecalciferol (VITAMIN D3) 50 MCG (2000 UT) TABS Take 2,000 Units by mouth daily.      doxycycline (VIBRA-TABS) 100 MG tablet Take 100 mg by mouth at bedtime.  11    finasteride (PROSCAR) 5 MG tablet Take 5 mg by mouth daily at 12 noon.      OVER THE COUNTER MEDICATION Take 25 mg by mouth at bedtime. CBD oil liquid      OVER THE COUNTER MEDICATION Apply 1 application topically daily as needed (pain). CBD salve      Tamsulosin HCl (FLOMAX) 0.4 MG CAPS Take 0.4 mg by mouth 2 (two) times daily.      valACYclovir (VALTREX) 500 MG tablet Take 500 mg by  mouth 2 (two) times daily as needed (Cold sore).   2    vitamin B-12 (CYANOCOBALAMIN) 1000 MCG tablet Take 1,000 mcg by mouth every other day.      No Known Allergies  Social History   Tobacco Use   Smoking status: Never   Smokeless tobacco: Never  Substance Use Topics   Alcohol use: No    Family History  Problem Relation Age of Onset   Heart disease Father    Heart attack Mother        age 22   Breast cancer Sister    Breast cancer Sister       Review of Systems  Constitutional: Negative.   HENT:  Positive for hearing loss.   Eyes: Negative.   Respiratory: Negative.    Cardiovascular: Negative.   Gastrointestinal: Negative.   Endocrine: Negative.   Genitourinary: Negative.   Musculoskeletal:  Positive for arthralgias.  Allergic/Immunologic: Negative.   Neurological:  Positive for dizziness.  Hematological: Negative.   Psychiatric/Behavioral: Negative.      Objective:  Physical Exam Constitutional:      Appearance: Normal appearance. He is normal weight.  HENT:     Head: Normocephalic and atraumatic.     Nose: Nose normal.     Mouth/Throat:     Mouth: Mucous membranes are moist.     Pharynx: Oropharynx is clear.  Eyes:     Pupils: Pupils are equal, round, and reactive to light.  Cardiovascular:     Pulses: Normal pulses.  Pulmonary:     Effort: Pulmonary effort is normal.  Musculoskeletal:        General: Swelling and tenderness present.     Cervical back: Normal range of motion and neck supple.     Comments: Physical exam is unchanged from last month active flexion is to 90.  Collateral ligaments have 1+ laxity with a good endpoint and previous radiographs showed some subsidence of the femoral component with thinning of the collateral ligament bone especially over the MCL on the femoral side.  Skin:    General: Skin is warm and dry.  Neurological:     General: No focal deficit present.     Mental Status: He is alert and oriented to person, place, and  time. Mental status is at baseline.  Psychiatric:        Mood and Affect: Mood normal.        Behavior: Behavior normal.        Thought Content: Thought content normal.        Judgment: Judgment normal.    Vital signs in last 24 hours:    Labs:  Estimated body mass index is 24.74 kg/m as calculated from the following:   Height as of 02/04/21: 5\' 11"  (1.803 m).  Weight as of 02/04/21: 80.5 kg.  Imaging Review Plain radiographs demonstrate bilateral AP weightbearing, bilateral Rosenberg, lateral and sunrise views of the right knee are taken and reviewed in office today.  Patient does have subsidence of his right femoral implant.    Assessment/Plan:  End stage arthritis, right knee(s) with failed previous arthroplasty.   The patient history, physical examination, clinical judgment of the provider and imaging studies are consistent with end stage degenerative joint disease of the right knee(s), previous total knee arthroplasty. Revision total knee arthroplasty is deemed medically necessary. The treatment options including medical management, injection therapy, arthroscopy and revision arthroplasty were discussed at length. The risks and benefits of revision total knee arthroplasty were presented and reviewed. The risks due to aseptic loosening, infection, stiffness, patella tracking problems, thromboembolic complications and other imponderables were discussed. The patient acknowledged the explanation, agreed to proceed with the plan and consent was signed. Patient is being admitted for inpatient treatment for surgery, pain control, PT, OT, prophylactic antibiotics, VTE prophylaxis, progressive ambulation and ADL's and discharge planning.The patient is planning to be discharged home with home health services

## 2021-02-06 NOTE — Anesthesia Preprocedure Evaluation (Addendum)
Anesthesia Evaluation  Patient identified by MRN, date of birth, ID band Patient awake    Reviewed: Allergy & Precautions, NPO status , Patient's Chart, lab work & pertinent test results  History of Anesthesia Complications Negative for: history of anesthetic complications  Airway Mallampati: I  TM Distance: >3 FB Neck ROM: Full    Dental  (+) Edentulous Upper, Missing, Dental Advisory Given,    Pulmonary neg pulmonary ROS,    Pulmonary exam normal breath sounds clear to auscultation       Cardiovascular + CAD (non-obstructive)  Normal cardiovascular exam+ Valvular Problems/Murmurs (mild-mod AS) AS  Rhythm:Regular Rate:Normal  Cath 08/2020: 1. Left dominant coronary anatomy 2. Patent left main 3. Patent LAD with no significant stenosis, but severe second diagonal branch stenosis 4. Moderate OM stenosis, no tight stenosis in LCx distribution 5. Patent, nondominant RCA 6. Normal LVEDP  Recommend: medical therapy, branch vessel CAD in patient without angina. Low risk nuclear stress test but nuclear defect fits coronary stenosis in second diagonal. PCI technically feasible if patient fails med Rx.   Stress Test 08/29/2020 ? Nuclear stress EF: 44%. ? Blood pressure demonstrated a normal response to exercise. ? There was no ST segment deviation noted during stress.  Exercise time: 9:30 min Workload: 10.1 METS, above average exercise capacity Test stopped due to: fatigue, SOB BP response: normal HR response: normal Rhythm: sinus rhythm with PACs at baseline. SR with stress. Sinus rhythm with PACs, PVCs, and one 5 beat run of SVT in recovery. ECG: no ischemia on stress ECG  Perfusion images show small partially reversible perfusion defect in the apical cap (segment 17) and lateral apex. Moderate perfusion defect with stress at segment 17 partially reversible to mild at rest. Mild perfusion defect in lateral apex returns to  normal at rest.  Stress EF calculates to 44%, however visually appears 50%. ECG gating may have been impacted by ectopy in recovery. Consider echo to confirm EF.  Findings overall low-intermediate risk, given possible reduced EF and small area of ischemia at the apex.  Echo 09/27/2018 1. The left ventricle has normal systolic function of 53-97%. The cavity  size was normal. There is no increased left ventricular wall thickness.  Echo evidence of pseudonormalization in diastolic relaxation. Elevated  mean left atrial pressure.  2. The right ventricle has normal systolic function. The cavity was  normal in size. There is no increase in right ventricular wall thickness.  Right ventricular systolic pressure is mildly elevated with an estimated  pressure of 32.4 mmHg.  3. Left atrial size was mildly dilated.  4. The mitral valve is normal in structure. There is mild thickening.  Mitral valve regurgitation is mild to moderate by color flow Doppler. The  MR jet is centrally-directed.  5. The tricuspid valve is normal in structure.  6. The aortic valve is tricuspid. There is moderate thickening and  moderate calcification of the aortic valve, with mildly decreased cusp  excursion. The calculated aortic valve area is 1.46 cm, consistent with  mild-moderate stenosis.  7. The pulmonic valve was normal in structure.  8. No evidence of left ventricular regional wall motion abnormalities.    Neuro/Psych negative neurological ROS  negative psych ROS   GI/Hepatic negative GI ROS, Neg liver ROS,   Endo/Other  negative endocrine ROS  Renal/GU negative Renal ROS  negative genitourinary   Musculoskeletal  (+) Arthritis , Osteoarthritis,  Loose revision right knee   Abdominal   Peds negative pediatric ROS (+)  Hematology negative  hematology ROS (+)   Anesthesia Other Findings   Reproductive/Obstetrics negative OB ROS                                                               Anesthesia Evaluation  Patient identified by MRN, date of birth, ID band Patient awake    Reviewed: Allergy & Precautions, NPO status   History of Anesthesia Complications (+) PROLONGED EMERGENCE and history of anesthetic complications  Airway Mallampati: II  TM Distance: >3 FB Neck ROM: Full    Dental  (+) Upper Dentures, Dental Advisory Given   Pulmonary neg pulmonary ROS,    Pulmonary exam normal        Cardiovascular negative cardio ROS Normal cardiovascular exam     Neuro/Psych negative neurological ROS  negative psych ROS   GI/Hepatic negative GI ROS, Neg liver ROS,   Endo/Other  negative endocrine ROS  Renal/GU negative Renal ROS     Musculoskeletal  (+) Arthritis ,   Abdominal   Peds  Hematology   Anesthesia Other Findings   Reproductive/Obstetrics                            Anesthesia Physical Anesthesia Plan  ASA: II  Anesthesia Plan: MAC and Spinal   Post-op Pain Management:    Induction:   Airway Management Planned: Simple Face Mask  Additional Equipment:   Intra-op Plan:   Post-operative Plan:   Informed Consent: I have reviewed the patients History and Physical, chart, labs and discussed the procedure including the risks, benefits and alternatives for the proposed anesthesia with the patient or authorized representative who has indicated his/her understanding and acceptance.   Dental advisory given  Plan Discussed with:   Anesthesia Plan Comments:        Anesthesia Quick Evaluation  Anesthesia Physical Anesthesia Plan  ASA: 3  Anesthesia Plan: Spinal, MAC and Regional   Post-op Pain Management:  Regional for Post-op pain   Induction:   PONV Risk Score and Plan: 2 and Propofol infusion and TIVA  Airway Management Planned: Natural Airway and Nasal Cannula  Additional Equipment: None  Intra-op Plan:   Post-operative Plan:   Informed Consent: I  have reviewed the patients History and Physical, chart, labs and discussed the procedure including the risks, benefits and alternatives for the proposed anesthesia with the patient or authorized representative who has indicated his/her understanding and acceptance.       Plan Discussed with: CRNA  Anesthesia Plan Comments: (Mild-mod AS, maintain MAP>65)       Anesthesia Quick Evaluation

## 2021-02-06 NOTE — Progress Notes (Signed)
Anesthesia Chart Review   Case: [id number] Date/Time: 02/11/21 7169   Procedure: RIGHT TOTAL KNEE REVISION (Left: Knee)   Anesthesia type: Spinal   Pre-op diagnosis: LOOSE REVISION RIGHT KNEE REPLACEMENT   Location: [facility] 07 / [facility]   Surgeons: Frederik Pear, MD       DISCUSSION:79 y.o. never smoker with h/o CAD, mild AS (aortic valve area is 1.46 cm, AV Mean Grad: 16.4 mmHg), loose right knee replacement scheduled for above procedure 02/11/2021 with Dr. Frederik Pear.   Recent cardiac cath 09/12/2020 with aortic mean gradient 22.53mmHg.   Per cardiology preoperative evaluation 01/25/2021, "Chart reviewed as part of pre-operative protocol coverage. Given past medical history and time since last visit, based on ACC/AHA guidelines, LEELYND MALDONADO would be at acceptable risk for the planned procedure without further cardiovascular testing.    His RCRI is a class III risk, 6.6% risk of major cardiac event.  He is able to complete greater than 4 METS of physical activity."  Anticipate pt can proceed with planned procedure barring acute status change.   VS: Pulse (!) 53   Temp 36.6 C   Resp 16   Ht 5\' 11"  (1.803 m)   Wt 80.5 kg   SpO2 100%   BMI 24.74 kg/m   PROVIDERS: [name], MD is PCP   O'Neal, Cassie Freer, MD is Cardiologist  LABS: Labs reviewed: Acceptable for surgery. (all labs ordered are listed, but only abnormal results are displayed)  Labs Reviewed  CBC WITH DIFFERENTIAL/PLATELET - Abnormal; Notable for the following components:      Result Value   Platelets 144 (*)    All other components within normal limits  BASIC METABOLIC PANEL - Abnormal; Notable for the following components:   Potassium 5.2 (*)    Glucose, Bld 102 (*)    All other components within normal limits  URINALYSIS, ROUTINE W REFLEX MICROSCOPIC - Abnormal; Notable for the following components:   Color, Urine STRAW (*)    All other components within normal limits  SURGICAL PCR SCREEN   PROTIME-INR  APTT  TYPE AND SCREEN     IMAGES:   EKG: 09/20/2020 Rate 52 bpm  Sinus bradycardia with marked sinus arrhythmia LAD  CV: Stress Test 08/29/2020 Nuclear stress EF: 44%. Blood pressure demonstrated a normal response to exercise. There was no ST segment deviation noted during stress.   Exercise time: 9:30 min Workload: 10.1 METS, above average exercise capacity Test stopped due to: fatigue, SOB BP response: normal HR response: normal Rhythm: sinus rhythm with PACs at baseline. SR with stress. Sinus rhythm with PACs, PVCs, and one 5 beat run of SVT in recovery. ECG: no ischemia on stress ECG   Perfusion images show small partially reversible perfusion defect in the apical cap (segment 17) and lateral apex. Moderate perfusion defect with stress at segment 17 partially reversible to mild at rest. Mild perfusion defect in lateral apex returns to normal at rest.   Stress EF calculates to 44%, however visually appears 50%. ECG gating may have been impacted by ectopy in recovery. Consider echo to confirm EF.   Findings overall low-intermediate risk, given possible reduced EF and small area of ischemia at the apex.  Cardiac Cath 09/12/2018 1. Left dominant coronary anatomy 2. Patent left main 3. Patent LAD with no significant stenosis, but severe second diagonal branch stenosis 4. Moderate OM stenosis, no tight stenosis in LCx distribution 5. Patent, nondominant RCA 6. Normal LVEDP   Recommend: medical therapy, branch vessel  CAD in patient without angina. Low risk nuclear stress test but nuclear defect fits coronary stenosis in second diagonal. PCI technically feasible if patient fails med Rx.  Echo 09/27/2018 1. The left ventricle has normal systolic function of 67-67%. The cavity  size was normal. There is no increased left ventricular wall thickness.  Echo evidence of pseudonormalization in diastolic relaxation. Elevated  mean left atrial pressure.   2. The right  ventricle has normal systolic function. The cavity was  normal in size. There is no increase in right ventricular wall thickness.  Right ventricular systolic pressure is mildly elevated with an estimated  pressure of 32.4 mmHg.   3. Left atrial size was mildly dilated.   4. The mitral valve is normal in structure. There is mild thickening.  Mitral valve regurgitation is mild to moderate by color flow Doppler. The  MR jet is centrally-directed.   5. The tricuspid valve is normal in structure.   6. The aortic valve is tricuspid. There is moderate thickening and  moderate calcification of the aortic valve, with mildly decreased cusp  excursion. The calculated aortic valve area is 1.46 cm, consistent with  mild-moderate stenosis.   7. The pulmonic valve was normal in structure.   8. No evidence of left ventricular regional wall motion abnormalities.  Past Medical History:  Diagnosis Date   Cancer [facility])    colon polyp   Complication of anesthesia    difficult to wake up after surgery   Coronary artery disease    DJD (degenerative joint disease)    Dr. Noemi Chapel end stages of DJD 2011   Hard of hearing    Urinary frequency    UTI (lower urinary tract infection)     Past Surgical History:  Procedure Laterality Date   ANKLE SURGERY Right    COLONOSCOPY W/ BIOPSIES AND POLYPECTOMY     ELBOW SURGERY Right    EYE SURGERY     HERNIA REPAIR     JOINT REPLACEMENT     KNEE SURGERY Right    x4   LEFT HEART CATH AND CORONARY ANGIOGRAPHY N/A 09/12/2020   Procedure: LEFT HEART CATH AND CORONARY ANGIOGRAPHY;  Surgeon: Sherren Mocha, MD;  Location: [place] [facility];  Service: Cardiovascular;  Laterality: N/A;   MULTIPLE TOOTH EXTRACTIONS     TOTAL KNEE ARTHROPLASTY Right    TOTAL KNEE ARTHROPLASTY Left 06/18/2015   Procedure: TOTAL KNEE ARTHROPLASTY;  Surgeon: Frederik Pear, MD;  Location: [place];  Service: Orthopedics;  Laterality: Left;    MEDICATIONS:  acetaminophen (TYLENOL) 650 MG CR  tablet   amoxicillin (AMOXIL) 500 MG capsule   aspirin EC 81 MG tablet   atorvastatin (LIPITOR) 10 MG tablet   CANNABIDIOL PO   Cholecalciferol (VITAMIN D3) 50 MCG (2000 UT) TABS   doxycycline (VIBRA-TABS) 100 MG tablet   finasteride (PROSCAR) 5 MG tablet   OVER THE COUNTER MEDICATION   OVER THE COUNTER MEDICATION   Tamsulosin HCl (FLOMAX) 0.4 MG CAPS   valACYclovir (VALTREX) 500 MG tablet   vitamin B-12 (CYANOCOBALAMIN) 1000 MCG tablet   No current facility-administered medications for this encounter.     Gerald Felix, PA-C [facility]Surgical [facility] [phone]

## 2021-02-07 ENCOUNTER — Other Ambulatory Visit (HOSPITAL_COMMUNITY)
Admission: RE | Admit: 2021-02-07 | Discharge: 2021-02-07 | Disposition: A | Discharge status: Home / Self Care | Payer: Medicare Other | Source: Ambulatory Visit | Attending: Orthopedic Surgery | Admitting: Orthopedic Surgery

## 2021-02-07 DIAGNOSIS — Z01812 Encounter for preprocedural laboratory examination: Secondary | ICD-10-CM | POA: Diagnosis not present

## 2021-02-07 DIAGNOSIS — Z20822 Contact with and (suspected) exposure to covid-19: Secondary | ICD-10-CM | POA: Insufficient documentation

## 2021-02-07 LAB — SARS CORONAVIRUS 2 (TAT 6-24 HRS): SARS Coronavirus 2: NEGATIVE

## 2021-02-10 MED ORDER — BUPIVACAINE LIPOSOME 1.3 % IJ SUSP
20.0000 mL | Freq: Once | INTRAMUSCULAR | Status: DC
  Filled 2021-02-10: qty 20

## 2021-02-10 MED ORDER — TRANEXAMIC ACID 1000 MG/10ML IV SOLN
2000.0000 mg | INTRAVENOUS | Status: DC
  Filled 2021-02-10: qty 20

## 2021-02-11 ENCOUNTER — Ambulatory Visit (HOSPITAL_COMMUNITY)
Admission: RE | Admit: 2021-02-11 | Discharge: 2021-02-11 | Disposition: A | Discharge status: Home / Self Care | Payer: Medicare Other | Attending: Orthopedic Surgery | Admitting: Orthopedic Surgery

## 2021-02-11 ENCOUNTER — Inpatient Hospital Stay (HOSPITAL_COMMUNITY): Payer: Medicare Other

## 2021-02-11 ENCOUNTER — Inpatient Hospital Stay (HOSPITAL_COMMUNITY): Payer: Medicare Other | Admitting: Certified Registered"

## 2021-02-11 ENCOUNTER — Encounter (HOSPITAL_COMMUNITY): Payer: Self-pay | Admitting: Orthopedic Surgery

## 2021-02-11 ENCOUNTER — Encounter (HOSPITAL_COMMUNITY)
Admission: RE | Disposition: A | Discharge status: Home / Self Care | Payer: Self-pay | Source: Home / Self Care | Attending: Orthopedic Surgery

## 2021-02-11 ENCOUNTER — Inpatient Hospital Stay (HOSPITAL_COMMUNITY): Payer: Medicare Other | Admitting: Physician Assistant

## 2021-02-11 DIAGNOSIS — T84032A Mechanical loosening of internal right knee prosthetic joint, initial encounter: Secondary | ICD-10-CM | POA: Diagnosis present

## 2021-02-11 DIAGNOSIS — L570 Actinic keratosis: Secondary | ICD-10-CM | POA: Diagnosis not present

## 2021-02-11 DIAGNOSIS — G8918 Other acute postprocedural pain: Secondary | ICD-10-CM | POA: Diagnosis not present

## 2021-02-11 DIAGNOSIS — Z9889 Other specified postprocedural states: Secondary | ICD-10-CM | POA: Diagnosis not present

## 2021-02-11 DIAGNOSIS — M6281 Muscle weakness (generalized): Secondary | ICD-10-CM | POA: Diagnosis not present

## 2021-02-11 DIAGNOSIS — R262 Difficulty in walking, not elsewhere classified: Secondary | ICD-10-CM | POA: Diagnosis not present

## 2021-02-11 DIAGNOSIS — Z7982 Long term (current) use of aspirin: Secondary | ICD-10-CM | POA: Insufficient documentation

## 2021-02-11 DIAGNOSIS — Z96651 Presence of right artificial knee joint: Secondary | ICD-10-CM | POA: Diagnosis not present

## 2021-02-11 DIAGNOSIS — Y9389 Activity, other specified: Secondary | ICD-10-CM | POA: Insufficient documentation

## 2021-02-11 DIAGNOSIS — Z8249 Family history of ischemic heart disease and other diseases of the circulatory system: Secondary | ICD-10-CM | POA: Insufficient documentation

## 2021-02-11 DIAGNOSIS — I340 Nonrheumatic mitral (valve) insufficiency: Secondary | ICD-10-CM | POA: Insufficient documentation

## 2021-02-11 DIAGNOSIS — I080 Rheumatic disorders of both mitral and aortic valves: Secondary | ICD-10-CM | POA: Diagnosis not present

## 2021-02-11 DIAGNOSIS — X58XXXA Exposure to other specified factors, initial encounter: Secondary | ICD-10-CM | POA: Insufficient documentation

## 2021-02-11 DIAGNOSIS — Z419 Encounter for procedure for purposes other than remedying health state, unspecified: Secondary | ICD-10-CM

## 2021-02-11 DIAGNOSIS — T84022A Instability of internal right knee prosthesis, initial encounter: Secondary | ICD-10-CM | POA: Insufficient documentation

## 2021-02-11 DIAGNOSIS — Z79899 Other long term (current) drug therapy: Secondary | ICD-10-CM | POA: Diagnosis not present

## 2021-02-11 DIAGNOSIS — I2510 Atherosclerotic heart disease of native coronary artery without angina pectoris: Secondary | ICD-10-CM | POA: Insufficient documentation

## 2021-02-11 DIAGNOSIS — M9689 Other intraoperative and postprocedural complications and disorders of the musculoskeletal system: Secondary | ICD-10-CM | POA: Diagnosis present

## 2021-02-11 HISTORY — PX: TOTAL KNEE REVISION: SHX996

## 2021-02-11 LAB — TYPE AND SCREEN
ABO/RH(D): O POS
Antibody Screen: NEGATIVE

## 2021-02-11 SURGERY — TOTAL KNEE REVISION
Anesthesia: Monitor Anesthesia Care | Site: Knee | Laterality: Right

## 2021-02-11 MED ORDER — LACTATED RINGERS IV SOLN: INTRAVENOUS | Status: DC

## 2021-02-11 MED ORDER — PROPOFOL 10 MG/ML IV BOLUS
INTRAVENOUS | Status: DC | PRN
  Administered 2021-02-11: 30 mg via INTRAVENOUS

## 2021-02-11 MED ORDER — STERILE WATER FOR IRRIGATION IR SOLN
Status: DC | PRN
  Administered 2021-02-11: 1000 mL

## 2021-02-11 MED ORDER — ROPIVACAINE HCL 5 MG/ML IJ SOLN
INTRAMUSCULAR | Status: DC | PRN
  Administered 2021-02-11: 30 mL via PERINEURAL

## 2021-02-11 MED ORDER — ORAL CARE MOUTH RINSE
15.0000 mL | Freq: Once | OROMUCOSAL | Status: AC
Start: 2021-02-11 — End: 2021-02-11
  Administered 2021-02-11: 15 mL via OROMUCOSAL

## 2021-02-11 MED ORDER — LACTATED RINGERS IV BOLUS: 250.0000 mL | Freq: Once | INTRAVENOUS | Status: DC

## 2021-02-11 MED ORDER — SODIUM CHLORIDE (PF) 0.9 % IJ SOLN
INTRAMUSCULAR | Status: DC | PRN
  Administered 2021-02-11: 50 mL via INTRAVENOUS

## 2021-02-11 MED ORDER — FENTANYL CITRATE (PF) 100 MCG/2ML IJ SOLN
50.0000 ug | INTRAMUSCULAR | Status: AC
Start: 2021-02-11 — End: 2021-02-11
  Administered 2021-02-11: 50 ug via INTRAVENOUS
  Filled 2021-02-11: qty 2

## 2021-02-11 MED ORDER — BUPIVACAINE-EPINEPHRINE (PF) 0.25% -1:200000 IJ SOLN
INTRAMUSCULAR | Status: AC
  Filled 2021-02-11: qty 30

## 2021-02-11 MED ORDER — METHOCARBAMOL 500 MG PO TABS: 500.0000 mg | ORAL_TABLET | Freq: Four times a day (QID) | ORAL | Status: DC | PRN

## 2021-02-11 MED ORDER — EPHEDRINE SULFATE-NACL 50-0.9 MG/10ML-% IV SOSY
PREFILLED_SYRINGE | INTRAVENOUS | Status: DC | PRN
  Administered 2021-02-11: 10 mg via INTRAVENOUS

## 2021-02-11 MED ORDER — CHLORHEXIDINE GLUCONATE 0.12 % MT SOLN: 15.0000 mL | Freq: Once | OROMUCOSAL | Status: AC

## 2021-02-11 MED ORDER — OXYCODONE HCL 5 MG PO TABS
5.0000 mg | ORAL_TABLET | Freq: Once | ORAL | Status: DC | PRN
Start: 2021-02-11 — End: 2021-02-11

## 2021-02-11 MED ORDER — ASPIRIN EC 81 MG PO TBEC: 81.0000 mg | DELAYED_RELEASE_TABLET | Freq: Two times a day (BID) | ORAL | 0 refills | Status: DC

## 2021-02-11 MED ORDER — OXYCODONE-ACETAMINOPHEN 5-325 MG PO TABS: 1.0000 | ORAL_TABLET | ORAL | 0 refills | Status: DC | PRN

## 2021-02-11 MED ORDER — DEXAMETHASONE SODIUM PHOSPHATE 10 MG/ML IJ SOLN
INTRAMUSCULAR | Status: DC | PRN
  Administered 2021-02-11: 8 mg via INTRAVENOUS

## 2021-02-11 MED ORDER — POVIDONE-IODINE 10 % EX SWAB
2.0000 "application " | Freq: Once | CUTANEOUS | Status: AC
  Administered 2021-02-11: 2 via TOPICAL

## 2021-02-11 MED ORDER — TIZANIDINE HCL 2 MG PO TABS: 2.0000 mg | ORAL_TABLET | Freq: Four times a day (QID) | ORAL | 0 refills | Status: DC | PRN

## 2021-02-11 MED ORDER — DEXAMETHASONE SODIUM PHOSPHATE 10 MG/ML IJ SOLN
INTRAMUSCULAR | Status: DC | PRN
  Administered 2021-02-11: 10 mg

## 2021-02-11 MED ORDER — SODIUM CHLORIDE 0.9% IV SOLUTION
INTRAVENOUS | Status: DC | PRN
  Administered 2021-02-11: 1000 mL

## 2021-02-11 MED ORDER — SODIUM CHLORIDE 0.9 % IV SOLN
INTRAVENOUS | Status: DC | PRN
  Administered 2021-02-11: 30 ug/min via INTRAVENOUS

## 2021-02-11 MED ORDER — BUPIVACAINE-EPINEPHRINE 0.25% -1:200000 IJ SOLN
INTRAMUSCULAR | Status: DC | PRN
  Administered 2021-02-11: 20 mL

## 2021-02-11 MED ORDER — TRANEXAMIC ACID-NACL 1000-0.7 MG/100ML-% IV SOLN: 1000.0000 mg | Freq: Once | INTRAVENOUS | Status: DC

## 2021-02-11 MED ORDER — OXYCODONE HCL 5 MG/5ML PO SOLN: 5.0000 mg | Freq: Once | ORAL | Status: DC | PRN

## 2021-02-11 MED ORDER — TRANEXAMIC ACID-NACL 1000-0.7 MG/100ML-% IV SOLN
1000.0000 mg | INTRAVENOUS | Status: AC
  Administered 2021-02-11: 1000 mg via INTRAVENOUS
  Filled 2021-02-11: qty 100

## 2021-02-11 MED ORDER — BUPIVACAINE IN DEXTROSE 0.75-8.25 % IT SOLN
INTRATHECAL | Status: DC | PRN
  Administered 2021-02-11: 2 mL via INTRATHECAL

## 2021-02-11 MED ORDER — PROPOFOL 10 MG/ML IV BOLUS
INTRAVENOUS | Status: AC
  Filled 2021-02-11: qty 20

## 2021-02-11 MED ORDER — ONDANSETRON HCL 4 MG/2ML IJ SOLN: 4.0000 mg | Freq: Once | INTRAMUSCULAR | Status: DC | PRN

## 2021-02-11 MED ORDER — ONDANSETRON HCL 4 MG/2ML IJ SOLN
INTRAMUSCULAR | Status: AC
  Filled 2021-02-11: qty 2

## 2021-02-11 MED ORDER — PROPOFOL 1000 MG/100ML IV EMUL
INTRAVENOUS | Status: AC
  Filled 2021-02-11: qty 100

## 2021-02-11 MED ORDER — MIDAZOLAM HCL 2 MG/2ML IJ SOLN
1.0000 mg | INTRAMUSCULAR | Status: DC
  Filled 2021-02-11: qty 2

## 2021-02-11 MED ORDER — PROPOFOL 500 MG/50ML IV EMUL
INTRAVENOUS | Status: AC
  Filled 2021-02-11: qty 50

## 2021-02-11 MED ORDER — 0.9 % SODIUM CHLORIDE (POUR BTL) OPTIME
TOPICAL | Status: DC | PRN
  Administered 2021-02-11: 1000 mL

## 2021-02-11 MED ORDER — LACTATED RINGERS IV BOLUS
500.0000 mL | Freq: Once | INTRAVENOUS | Status: AC
  Administered 2021-02-11: 500 mL via INTRAVENOUS

## 2021-02-11 MED ORDER — FENTANYL CITRATE (PF) 100 MCG/2ML IJ SOLN: 25.0000 ug | INTRAMUSCULAR | Status: DC | PRN

## 2021-02-11 MED ORDER — PHENYLEPHRINE HCL (PRESSORS) 10 MG/ML IV SOLN
INTRAVENOUS | Status: AC
  Filled 2021-02-11: qty 1

## 2021-02-11 MED ORDER — TRANEXAMIC ACID 1000 MG/10ML IV SOLN
INTRAVENOUS | Status: DC | PRN
  Administered 2021-02-11: 2000 mg via TOPICAL

## 2021-02-11 MED ORDER — PROPOFOL 500 MG/50ML IV EMUL
INTRAVENOUS | Status: DC | PRN
  Administered 2021-02-11: 80 ug/kg/min via INTRAVENOUS

## 2021-02-11 MED ORDER — ONDANSETRON HCL 4 MG/2ML IJ SOLN
INTRAMUSCULAR | Status: DC | PRN
  Administered 2021-02-11: 4 mg via INTRAVENOUS

## 2021-02-11 MED ORDER — CEFAZOLIN SODIUM-DEXTROSE 2-4 GM/100ML-% IV SOLN
2.0000 g | INTRAVENOUS | Status: AC
  Administered 2021-02-11: 2 g via INTRAVENOUS
  Filled 2021-02-11: qty 100

## 2021-02-11 MED ORDER — METHOCARBAMOL 500 MG IVPB - SIMPLE MED: 500.0000 mg | Freq: Four times a day (QID) | INTRAVENOUS | Status: DC | PRN

## 2021-02-11 MED ORDER — DEXAMETHASONE SODIUM PHOSPHATE 10 MG/ML IJ SOLN
INTRAMUSCULAR | Status: AC
  Filled 2021-02-11: qty 1

## 2021-02-11 MED ORDER — BUPIVACAINE LIPOSOME 1.3 % IJ SUSP
INTRAMUSCULAR | Status: DC | PRN
  Administered 2021-02-11: 20 mL

## 2021-02-11 MED ORDER — LIDOCAINE HCL (CARDIAC) PF 100 MG/5ML IV SOSY: PREFILLED_SYRINGE | INTRAVENOUS | Status: DC | PRN

## 2021-02-11 SURGICAL SUPPLY — 62 items
ADAPTER BOLT FEMORAL +2/-2 (Knees) ×1 IMPLANT
ADPR FEM +2/-2 OFST BOLT (Knees) ×1 IMPLANT
ADPR FEM 5D STRL KN PFC SGM (Orthopedic Implant) ×1 IMPLANT
BAG DECANTER FOR FLEXI CONT (MISCELLANEOUS) ×2 IMPLANT
BAG SPEC THK2 15X12 ZIP CLS (MISCELLANEOUS) ×1
BAG ZIPLOCK 12X15 (MISCELLANEOUS) ×2 IMPLANT
BLADE OSCILLATING/SAGITTAL (BLADE) ×4
BLADE SAG 18X100X1.27 (BLADE) ×2 IMPLANT
BLADE SAW SGTL 11.0X1.19X90.0M (BLADE) ×2 IMPLANT
BLADE SAW SGTL 81X20 HD (BLADE) ×2 IMPLANT
BLADE SURG SZ10 CARB STEEL (BLADE) ×4 IMPLANT
BLADE SW THK.38XMED LNG THN (BLADE) ×2 IMPLANT
BNDG CMPR MED 10X6 ELC LF (GAUZE/BANDAGES/DRESSINGS) ×1
BNDG ELASTIC 6X10 VLCR STRL LF (GAUZE/BANDAGES/DRESSINGS) ×3 IMPLANT
BOWL SMART MIX CTS (DISPOSABLE) ×2 IMPLANT
BRUSH FEMORAL CANAL (MISCELLANEOUS) ×2 IMPLANT
BUR OVAL CARBIDE 4.0 (BURR) ×1 IMPLANT
CEMENT HV SMART SET (Cement) ×5 IMPLANT
COVER SURGICAL LIGHT HANDLE (MISCELLANEOUS) ×2 IMPLANT
COVER WAND RF STERILE (DRAPES) IMPLANT
CUFF TOURN SGL QUICK 34 (TOURNIQUET CUFF) ×2
CUFF TRNQT CYL 34X4.125X (TOURNIQUET CUFF) ×1 IMPLANT
DECANTER SPIKE VIAL GLASS SM (MISCELLANEOUS) ×6 IMPLANT
DRAPE ORTHO SPLIT 77X108 STRL (DRAPES) ×4
DRAPE SURG ORHT 6 SPLT 77X108 (DRAPES) ×2 IMPLANT
DRAPE U-SHAPE 47X51 STRL (DRAPES) ×2 IMPLANT
DRSG AQUACEL AG ADV 3.5X14 (GAUZE/BANDAGES/DRESSINGS) ×2 IMPLANT
DURAPREP 26ML APPLICATOR (WOUND CARE) ×2 IMPLANT
ELECT REM PT RETURN 15FT ADLT (MISCELLANEOUS) ×2 IMPLANT
FEM TC3 PFC SIGMA SZ5 (Orthopedic Implant) ×2 IMPLANT
FEMORAL ADAPTER (Orthopedic Implant) ×1 IMPLANT
FEMORAL TC3 PFC SIGMA SZ5 (Orthopedic Implant) IMPLANT
GLOVE SRG 8 PF TXTR STRL LF DI (GLOVE) ×1 IMPLANT
GLOVE SURG ENC MOIS LTX SZ7.5 (GLOVE) ×2 IMPLANT
GLOVE SURG ENC MOIS LTX SZ8.5 (GLOVE) ×2 IMPLANT
GLOVE SURG UNDER POLY LF SZ8 (GLOVE) ×2
GLOVE SURG UNDER POLY LF SZ9 (GLOVE) ×2 IMPLANT
GOWN STRL REUS W/TWL XL LVL3 (GOWN DISPOSABLE) ×4 IMPLANT
HANDPIECE INTERPULSE COAX TIP (DISPOSABLE) ×2
HOOD PEEL AWAY FLYTE STAYCOOL (MISCELLANEOUS) ×6 IMPLANT
INSERT TIB TC3 RP SZ5 10 (Knees) ×1 IMPLANT
KIT TURNOVER KIT A (KITS) ×2 IMPLANT
NDL HYPO 21X1.5 SAFETY (NEEDLE) ×2 IMPLANT
NEEDLE HYPO 21X1.5 SAFETY (NEEDLE) ×4 IMPLANT
NS IRRIG 1000ML POUR BTL (IV SOLUTION) ×2 IMPLANT
PACK TOTAL KNEE CUSTOM (KITS) ×2 IMPLANT
PENCIL SMOKE EVACUATOR (MISCELLANEOUS) ×1 IMPLANT
PROTECTOR NERVE ULNAR (MISCELLANEOUS) ×2 IMPLANT
SET HNDPC FAN SPRY TIP SCT (DISPOSABLE) ×1 IMPLANT
SLEEVE UNIV FEM DIST PRO SZ 31 (Sleeve) ×1 IMPLANT
STAPLER VISISTAT 35W (STAPLE) IMPLANT
STEM UNIVERSAL FLUTED 150X14MM (Shell) ×1 IMPLANT
SUT VIC AB 1 CTX 36 (SUTURE) ×2
SUT VIC AB 1 CTX36XBRD ANBCTR (SUTURE) ×1 IMPLANT
SUT VIC AB 3-0 CT1 27 (SUTURE) ×2
SUT VIC AB 3-0 CT1 TAPERPNT 27 (SUTURE) ×1 IMPLANT
SWAB COLLECTION DEVICE MRSA (MISCELLANEOUS) IMPLANT
SWAB CULTURE ESWAB REG 1ML (MISCELLANEOUS) IMPLANT
SYR CONTROL 10ML LL (SYRINGE) ×4 IMPLANT
TRAY FOLEY MTR SLVR 16FR STAT (SET/KITS/TRAYS/PACK) ×2 IMPLANT
WATER STERILE IRR 1000ML POUR (IV SOLUTION) ×4 IMPLANT
WRAP KNEE MAXI GEL POST OP (GAUZE/BANDAGES/DRESSINGS) ×2 IMPLANT

## 2021-02-11 NOTE — Transfer of Care (Signed)
Immediate Anesthesia Transfer of Care Note  Patient: Gerald Spears  Procedure(s) Performed: RIGHT TOTAL KNEE REVISION (Right: Knee)  Patient Location: PACU  Anesthesia Type:Spinal  Level of Consciousness: awake  Airway & Oxygen Therapy: Patient Spontanous Breathing and Patient connected to face mask oxygen  Post-op Assessment: Report given to RN and Post -op Vital signs reviewed and stable  Post vital signs: Reviewed and stable  Last Vitals:  Vitals Value Taken Time  BP 140/78   Temp    Pulse 68 02/11/21 1348  Resp 18 02/11/21 1348  SpO2 99 % 02/11/21 1348  Vitals shown include unvalidated device data.  Last Pain:  Vitals:   02/11/21 0739  TempSrc: Oral         Complications: No notable events documented.

## 2021-02-11 NOTE — Anesthesia Procedure Notes (Signed)
Anesthesia Regional Block: Adductor canal block   Pre-Anesthetic Checklist: , timeout performed,  Correct Patient, Correct Site, Correct Laterality,  Correct Procedure, Correct Position, site marked,  Risks and benefits discussed,  Surgical consent,  Pre-op evaluation,  At surgeon's request and post-op pain management  Laterality: Right  Prep: Maximum Sterile Barrier Precautions used, chloraprep       Needles:  Injection technique: Single-shot  Needle Type: Echogenic Stimulator Needle     Needle Length: 9cm  Needle Gauge: 22     Additional Needles:   Procedures:,,,, ultrasound used (permanent image in chart),,    Narrative:  Start time: 02/11/2021 8:25 AM End time: 02/11/2021 8:30 AM Injection made incrementally with aspirations every 5 mL.  Performed by: Personally  Anesthesiologist: Pervis Hocking, DO  Additional Notes: Monitors applied. No increased pain on injection. No increased resistance to injection. Injection made in 5cc increments. Good needle visualization. Patient tolerated procedure well.

## 2021-02-11 NOTE — Discharge Instructions (Addendum)

## 2021-02-11 NOTE — Op Note (Signed)
PATIENT ID:      Gerald Spears  MRN:     [id number] DOB/AGE:    81-Oct-1942 / 81 y.o.       OPERATIVE REPORT   DATE OF PROCEDURE:  02/11/2021      PREOPERATIVE DIAGNOSIS:   LOOSE REVISION RIGHT KNEE REPLACEMENT      Estimated body mass index is 24.74 kg/m as calculated from the following:   Height as of 02/04/21: 5\' 11"  (1.803 m).   Weight as of 02/04/21: 80.5 kg.                                                       POSTOPERATIVE DIAGNOSIS:   LOOSE REVISION RIGHT KNEE REPLACEMENT                                                                       PROCEDURE: Revision right total knee arthroplasty with removal of loose DePuy TC 3 stemmed femoral component and revision to a new #5 right TC 3 femoral component adapter translated 2 mm anteriorly on the femur 31 mm sleeve 14 mm x 115 mm fluted stem cemented.  The patellar and tibial components were in good condition.    SURGEON: [name]  ASSISTANT:   Kerry Hough. [employer]   (Present and scrubbed throughout the case, critical for assistance with exposure, retraction, instrumentation, and closure.)        ANESTHESIA: Spinal, 20cc Exparel, 50cc 0.25% Marcaine EBL: 400 cc FLUID REPLACEMENT: 1800 cc crystaloid TOURNIQUET: Not used DRAINS: None TRANEXAMIC ACID: 1gm IV, 2gm topical COMPLICATIONS:  None         INDICATIONS FOR PROCEDURE: 80 year old gentleman who had a primary total knee arthroplasty done in the year 2011.  It became infected with staph epidermidis requiring removal of implants and placement of spacer in June 2012 and revision to a stemmed DePuy TC 3 femoral component and MBT tibial component in the year 2012.  Patient did well until a few years ago when he started having onset of pain and laxity in the right total knee.  Plain radiographs show that the femoral component was becoming loose and migrating proximally.  The tibial and patellar components were in good condition.  Prior to surgery the femoral component had migrated  about 1 cm.  He had no fevers or chills or effusions.  His inflammatory markers were negative.  To decrease pain and increase function he desires elective revision of loose components right total knee.  Risks and benefits of surgery have been discussed, questions answered.   DESCRIPTION OF PROCEDURE: The patient identified by armband, received  IV antibiotics, in the holding area at [facility]. Patient taken to the operating room, appropriate anesthetic monitors were attached, and spinal anesthesia was  induced. IV Tranexamic acid was given.Tourniquet applied high to the operative thigh. Lateral post and foot positioner applied to the table, the lower extremity was then prepped and draped in usual sterile fashion from the toes to the tourniquet. Time-out procedure was performed. The skin and subcutaneous tissue along the incision was  injected with 20 cc of a mixture of Exparel and Marcaine solution, using a 20-gauge by 1-1/2 inch needle. We began the operation, with the knee flexed 65 degrees, by making the anterior midline incision starting at handbreadth above the patella going over the patella 1 cm medial to and 4 cm distal to the tibial tubercle. Small bleeders in the skin and the subcutaneous tissue identified and cauterized. Transverse retinaculum was incised and reflected medially and a medial parapatellar arthrotomy was accomplished.  Large amounts of scar tissue from the peripatellar region medially and laterally along the gutters were removed allowing [place] to regain flexion and allowing [place] to sublux the patella laterally.  The joint fluid was noted to have a normal appearance.  The tibial and patellar components were noted to not be loose to testing.  We continue to remove posterior scar tissue allowing [place] to externally rotate the tibia.  The tibial bearing was removed after removing the MBT fixation pin and using an osteotome to cut through the polyethylene stem.  This further enhanced our  exposure allowing [place] to hyperflexed the knee and remove more posterior scar tissue.  The femoral component which was loose was then removed using a mallet.  Fibrous membrane was removed from the distal femur and from the canal where the stem was.  The medial and lateral epicondyles were noted to actually be in relatively good condition with no loosening.  We then actually reamed the femur to the appropriate depth for a sleeve with a 115 mm stem.  C arm was used to make sure that the reamer was in the center of the canal up near the hip.  We are able to ream up to 14 mm.  Proximally were reamed to 15 mm followed by the conical reaming for the 31 mm sleeve broach.  We then broached to the appropriate depth to get the joint line back to the level of the medial and lateral condyles of her remaining.  A trial was assembled with a 5 right femoral component with the stem translated 2 mm anteriorly, 31 mm trial sleeve, 14 mm x 115 mm stem.  This was inserted inserted into the femur and the rotation was adjusted using the screwdriver and the adjustment screw.  10 mm trial bearing was inserted the knee was taken to full extension and flexed to 90 degrees without difficulty and good tension was noted.  At this point the trial components were removed all bony surfaces were WaterPik cleaned and dried with suction and sponges.  The real implant with a 5 right TC 3 femoral component, stem module translated anteriorly 2 mm, 31 mm sleeve, 14 mm x 115 mm fluted stem was assembled at the back table.  A triple batch of DePuy HV cement was then mixed and applied to the distal femur and along the distal aspect of the 31 mm sleeve.  It was also applied to the exposed bony surfaces of the femur and the implant was then hammered into place.  Excess cement was removed. The knee was held at 30 flexion with compression, while the cement cured. The wound was irrigated out with normal saline solution pulse lavage. The rest of the Exparel was  injected into the parapatellar arthrotomy, subcutaneous tissues, and periosteal tissues. The parapatellar arthrotomy was closed with running #1 Vicryl suture. The subcutaneous tissue with 0 and 2-0 undyed Vicryl suture, and the skin with running 3-0 SQ vicryl. An Aquacil and Ace wrap were applied. The patient was taken  to recovery room without difficulty.   [name] 02/11/2021, 1:17 PM

## 2021-02-11 NOTE — Anesthesia Procedure Notes (Signed)
Spinal  Patient location during procedure: OR Start time: 02/11/2021 10:29 AM End time: 02/11/2021 10:40 AM Reason for block: surgical anesthesia Staffing Performed: anesthesiologist  Anesthesiologist: Pervis Hocking, DO Preanesthetic Checklist Completed: patient identified, IV checked, risks and benefits discussed, surgical consent, monitors and equipment checked, pre-op evaluation and timeout performed Spinal Block Patient position: sitting Prep: DuraPrep and site prepped and draped Patient monitoring: cardiac monitor, continuous pulse ox and blood pressure Approach: midline Location: L3-4 Injection technique: single-shot Needle Needle type: Pencan  Needle gauge: 24 G Needle length: 9 cm Assessment Sensory level: T6 Events: CSF return and second provider Additional Notes Functioning IV was confirmed and monitors were applied. Sterile prep and drape, including hand hygiene and sterile gloves were used. The patient was positioned and the spine was prepped. The skin was anesthetized with lidocaine.  Free flow of clear CSF was obtained prior to injecting local anesthetic into the CSF.  The spinal needle aspirated freely following injection.  The needle was carefully withdrawn.  The patient tolerated the procedure well.

## 2021-02-11 NOTE — Anesthesia Procedure Notes (Signed)
Procedure Name: MAC Date/Time: 02/11/2021 10:31 AM Performed by: Niel Hummer, CRNA Pre-anesthesia Checklist: Emergency Drugs available, Suction available, Patient being monitored and Patient identified Oxygen Delivery Method: Simple face mask

## 2021-02-11 NOTE — Interval H&P Note (Signed)
History and Physical Interval Note:  02/11/2021 8:57 AM  Gerald Spears  has presented today for surgery, with the diagnosis of LOOSE REVISION RIGHT KNEE REPLACEMENT.  The various methods of treatment have been discussed with the patient and family. After consideration of risks, benefits and other options for treatment, the patient has consented to  Procedure(s): RIGHT TOTAL KNEE REVISION (Right) as a surgical intervention.  The patient's history has been reviewed, patient examined, no change in status, stable for surgery.  I have reviewed the patient's chart and labs.  Questions were answered to the patient's satisfaction.     [name]

## 2021-02-11 NOTE — Progress Notes (Signed)
AssistedDr. Beth Finucane with right, ultrasound guided, adductor canal block. Side rails up, monitors on throughout procedure. See vital signs in flow sheet. Tolerated Procedure well.  

## 2021-02-11 NOTE — Progress Notes (Signed)
Physical Therapy Evaluation Patient Details Name: Gerald Spears MRN: [id number] DOB: 02-Apr-1941 Today's Date: 02/11/2021   Clinical Impression  Gerald Spears is a 80 y.o. male POD 0 s/p Rt TKR. Patient reports independence with mobility at baseline. Patient is now limited by functional impairments (see PT problem list below) and requires min guard/supervision for transfers and gait with RW. Patient was able to ambulate 100 feet with RW and min guard/supervision and cues for safe walker management. Patient educated on safe sequencing for stair mobility and verbalized safe guarding position for people assisting with mobility. Patient instructed in exercises to facilitate ROM and circulation. Patient will benefit from continued skilled PT interventions to address impairments and progress towards PLOF. Patient has met mobility goals at adequate level for discharge home; will continue to follow if pt continues acute stay to progress towards Mod I goals.       02/11/21 1500  PT Visit Information  Last PT Received On 02/11/21  Assistance Needed +1  History of Present Illness Patient is 80 y.o. male s/p Rt TKR on 02/11/21 with PMH significant for CAD, HOH, colon cancer, Bil TKA.  Precautions  Precautions Fall  Restrictions  Weight Bearing Restrictions No  Other Position/Activity Restrictions WBAT  Home Living  Family/patient expects to be discharged to: Private residence  Living Arrangements Spouse/significant other  Available Help at Discharge Family  Type of [place] to enter  Entrance Stairs-Number of Steps 5  Entrance Stairs-Rails Left  [place] Two level;Laundry or work area in basement;Full bath on main level;Able to live on main level with bedroom/bathroom  Print production planner Handicapped height  Bathroom Accessibility Yes  Home Equipment Grab bars - tub/shower;BSC;Cane - single point  Prior Function  Level of  Independence Independent  Communication  Communication No difficulties  Pain Assessment  Pain Assessment 0-10  Pain Score 1  Pain Location Rt knee  Pain Descriptors / Indicators Aching;Discomfort (heavy)  Pain Intervention(s) Limited activity within patient's tolerance;Monitored during session;Repositioned  Cognition  Arousal/Alertness Awake/alert  Behavior During Therapy WFL for tasks assessed/performed  Overall Cognitive Status Within Functional Limits for tasks assessed  Upper Extremity Assessment  Upper Extremity Assessment Overall WFL for tasks assessed  Lower Extremity Assessment  Lower Extremity Assessment RLE deficits/detail  RLE Deficits / Details good quad activation, no extensor lag with SLR  RLE Sensation WNL  RLE Coordination WNL  Cervical / Trunk Assessment  Cervical / Trunk Assessment Normal  Bed Mobility  Overal bed mobility Needs Assistance  Bed Mobility Supine to Sit  Supine to sit Min guard;Supervision;HOB elevated  General bed mobility comments pt takign extra time to sit up to EOB, no assist required.  Transfers  Overall transfer level Needs assistance  Equipment used Rolling walker (2 wheeled)  Transfers Sit to/from Stand  Sit to Stand Min guard;Supervision  General transfer comment cues for technique/hand placement, no assist rewuired to rise or steady.  Ambulation/Gait  Ambulation/Gait assistance Min guard;Supervision  Gait Distance (Feet) 100 Feet  Assistive device Rolling walker (2 wheeled)  Gait Pattern/deviations Step-to pattern;Decreased stride length;Decreased weight shift to right  General Gait Details cues for step to pattern and proximity to RW, no overt LOB or buckling at Rt knee observed. pt progressed to supervision level with RW.  Gait velocity decr  Stairs Yes  Stairs assistance Min guard;Supervision  Stair Management One rail Left;Step to pattern;Forwards;With cane  Number of Stairs 4  General stair comments cues for  step sequencing  with SPC, "up wtih good, down with bad" and for cane placement. no LOB noted.  Balance  Overall balance assessment Mild deficits observed, not formally tested  Exercises  Exercises Total Joint  Total Joint Exercises  Ankle Circles/Pumps AROM;Both;Seated;20 reps  [employer] Both;10 reps;Seated;Right  Short Arc Quad AROM;Right;5 reps;Seated  Heel Slides AROM;Right;5 reps;Seated  Hip ABduction/ADduction AROM;Right;5 reps;Seated  PT - End of Session  Equipment Utilized During Treatment Gait belt  Activity Tolerance Patient tolerated treatment well  Patient left in chair;with call bell/phone within reach  Nurse Communication Mobility status  PT Assessment  PT Recommendation/Assessment Patient needs continued PT services  PT Visit Diagnosis Muscle weakness (generalized) (M62.81);Difficulty in walking, not elsewhere classified (R26.2)  PT Problem List Decreased strength;Decreased range of motion;Decreased activity tolerance;Decreased balance;Decreased mobility;Decreased knowledge of use of DME;Decreased knowledge of precautions;Decreased safety awareness  PT Plan  PT Frequency (ACUTE ONLY) 7X/week  PT Treatment/Interventions (ACUTE ONLY) DME instruction;Stair training;Gait training;Functional mobility training;Therapeutic activities;Therapeutic exercise;Balance training;Neuromuscular re-education;Patient/family education  AM-PAC PT "6 Clicks" Mobility Outcome Measure (Version 2)  Help needed turning from your back to your side while in a flat bed without using bedrails? 3  Help needed moving from lying on your back to sitting on the side of a flat bed without using bedrails? 3  Help needed moving to and from a bed to a chair (including a wheelchair)? 3  Help needed standing up from a chair using your arms (e.g., wheelchair or bedside chair)? 3  Help needed to walk in hospital room? 3  Help needed climbing 3-5 steps with a railing?  3  6 Click Score 18  Consider Recommendation of Discharge To:  Home with [facility]  PT Recommendation  Follow Up Recommendations Home health PT;Follow surgeon's recommendation for DC plan and follow-up therapies  PT equipment Rolling walker with 5" wheels (in PACU)  Individuals Consulted  Consulted and Agree with Results and Recommendations Patient  Acute Rehab PT Goals  Patient Stated Goal get home today  PT Goal Formulation With patient  Time For Goal Achievement 02/18/21  Potential to Achieve Goals Good  PT Time Calculation  PT Start Time (ACUTE ONLY) 1545  PT Stop Time (ACUTE ONLY) 1616  PT Time Calculation (min) (ACUTE ONLY) 31 min  PT General Charges  $$ ACUTE PT VISIT 1 Visit  PT Evaluation  $PT Eval Low Complexity 1 Low  PT Treatments  $Gait Training 8-22 mins  Written Expression  Dominant Hand Right     Verner Mould, DPT Acute Rehabilitation Services Office [phone] Pager [phone]   Jacques Navy 02/11/2021, 8:18 PM

## 2021-02-11 NOTE — Anesthesia Postprocedure Evaluation (Signed)
Anesthesia Post Note  Patient: Gerald Spears  Procedure(s) Performed: RIGHT TOTAL KNEE REVISION (Right: Knee)     Patient location during evaluation: PACU Anesthesia Type: Regional Level of consciousness: oriented and awake and alert Pain management: pain level controlled Vital Signs Assessment: post-procedure vital signs reviewed and stable Respiratory status: spontaneous breathing, respiratory function stable and nonlabored ventilation Cardiovascular status: blood pressure returned to baseline and stable Postop Assessment: no headache, no backache, no apparent nausea or vomiting and spinal receding Anesthetic complications: no   No notable events documented.  Last Vitals:  Vitals:   02/11/21 1400 02/11/21 1415  BP: 124/78 (!) 131/93  Pulse: 69 71  Resp: 19 19  Temp:    SpO2: 100% 100%    Last Pain:  Vitals:   02/11/21 1415  TempSrc:   PainSc: 0-No pain                 Lidia Collum

## 2021-02-12 ENCOUNTER — Encounter (HOSPITAL_COMMUNITY): Payer: Self-pay | Admitting: Orthopedic Surgery

## 2021-02-12 DIAGNOSIS — Z471 Aftercare following joint replacement surgery: Secondary | ICD-10-CM | POA: Diagnosis not present

## 2021-02-12 DIAGNOSIS — Z96651 Presence of right artificial knee joint: Secondary | ICD-10-CM | POA: Diagnosis not present

## 2021-02-13 DIAGNOSIS — T84032D Mechanical loosening of internal right knee prosthetic joint, subsequent encounter: Secondary | ICD-10-CM | POA: Diagnosis not present

## 2021-02-13 DIAGNOSIS — Z85038 Personal history of other malignant neoplasm of large intestine: Secondary | ICD-10-CM | POA: Diagnosis not present

## 2021-02-13 DIAGNOSIS — I2510 Atherosclerotic heart disease of native coronary artery without angina pectoris: Secondary | ICD-10-CM | POA: Diagnosis not present

## 2021-02-13 DIAGNOSIS — I080 Rheumatic disorders of both mitral and aortic valves: Secondary | ICD-10-CM | POA: Diagnosis not present

## 2021-02-13 DIAGNOSIS — K5900 Constipation, unspecified: Secondary | ICD-10-CM | POA: Diagnosis not present

## 2021-02-13 DIAGNOSIS — R3915 Urgency of urination: Secondary | ICD-10-CM | POA: Diagnosis not present

## 2021-02-13 DIAGNOSIS — L570 Actinic keratosis: Secondary | ICD-10-CM | POA: Diagnosis not present

## 2021-02-13 DIAGNOSIS — R350 Frequency of micturition: Secondary | ICD-10-CM | POA: Diagnosis not present

## 2021-02-13 DIAGNOSIS — H9190 Unspecified hearing loss, unspecified ear: Secondary | ICD-10-CM | POA: Diagnosis not present

## 2021-02-15 DIAGNOSIS — I080 Rheumatic disorders of both mitral and aortic valves: Secondary | ICD-10-CM | POA: Diagnosis not present

## 2021-02-15 DIAGNOSIS — R3915 Urgency of urination: Secondary | ICD-10-CM | POA: Diagnosis not present

## 2021-02-15 DIAGNOSIS — K5900 Constipation, unspecified: Secondary | ICD-10-CM | POA: Diagnosis not present

## 2021-02-15 DIAGNOSIS — R350 Frequency of micturition: Secondary | ICD-10-CM | POA: Diagnosis not present

## 2021-02-15 DIAGNOSIS — I2510 Atherosclerotic heart disease of native coronary artery without angina pectoris: Secondary | ICD-10-CM | POA: Diagnosis not present

## 2021-02-15 DIAGNOSIS — T84032D Mechanical loosening of internal right knee prosthetic joint, subsequent encounter: Secondary | ICD-10-CM | POA: Diagnosis not present

## 2021-02-17 DIAGNOSIS — T84032D Mechanical loosening of internal right knee prosthetic joint, subsequent encounter: Secondary | ICD-10-CM | POA: Diagnosis not present

## 2021-02-17 DIAGNOSIS — R350 Frequency of micturition: Secondary | ICD-10-CM | POA: Diagnosis not present

## 2021-02-17 DIAGNOSIS — R3915 Urgency of urination: Secondary | ICD-10-CM | POA: Diagnosis not present

## 2021-02-17 DIAGNOSIS — K5900 Constipation, unspecified: Secondary | ICD-10-CM | POA: Diagnosis not present

## 2021-02-17 DIAGNOSIS — I080 Rheumatic disorders of both mitral and aortic valves: Secondary | ICD-10-CM | POA: Diagnosis not present

## 2021-02-17 DIAGNOSIS — I2510 Atherosclerotic heart disease of native coronary artery without angina pectoris: Secondary | ICD-10-CM | POA: Diagnosis not present

## 2021-02-19 ENCOUNTER — Emergency Department (HOSPITAL_BASED_OUTPATIENT_CLINIC_OR_DEPARTMENT_OTHER): Payer: Medicare Other

## 2021-02-19 ENCOUNTER — Encounter (HOSPITAL_BASED_OUTPATIENT_CLINIC_OR_DEPARTMENT_OTHER): Payer: Self-pay | Admitting: Obstetrics and Gynecology

## 2021-02-19 ENCOUNTER — Emergency Department (HOSPITAL_BASED_OUTPATIENT_CLINIC_OR_DEPARTMENT_OTHER)
Admission: EM | Admit: 2021-02-19 | Discharge: 2021-02-19 | Disposition: A | Discharge status: Home / Self Care | Payer: Medicare Other | Attending: Emergency Medicine | Admitting: Emergency Medicine

## 2021-02-19 ENCOUNTER — Other Ambulatory Visit: Payer: Self-pay

## 2021-02-19 DIAGNOSIS — S0001XA Abrasion of scalp, initial encounter: Secondary | ICD-10-CM | POA: Insufficient documentation

## 2021-02-19 DIAGNOSIS — R000 Tachycardia, unspecified: Secondary | ICD-10-CM | POA: Diagnosis not present

## 2021-02-19 DIAGNOSIS — Z85038 Personal history of other malignant neoplasm of large intestine: Secondary | ICD-10-CM | POA: Diagnosis not present

## 2021-02-19 DIAGNOSIS — Z96653 Presence of artificial knee joint, bilateral: Secondary | ICD-10-CM | POA: Diagnosis not present

## 2021-02-19 DIAGNOSIS — R609 Edema, unspecified: Secondary | ICD-10-CM | POA: Diagnosis not present

## 2021-02-19 DIAGNOSIS — M79661 Pain in right lower leg: Secondary | ICD-10-CM | POA: Diagnosis not present

## 2021-02-19 DIAGNOSIS — R55 Syncope and collapse: Secondary | ICD-10-CM | POA: Diagnosis not present

## 2021-02-19 DIAGNOSIS — W1839XA Other fall on same level, initial encounter: Secondary | ICD-10-CM | POA: Insufficient documentation

## 2021-02-19 DIAGNOSIS — M7989 Other specified soft tissue disorders: Secondary | ICD-10-CM | POA: Insufficient documentation

## 2021-02-19 DIAGNOSIS — K5900 Constipation, unspecified: Secondary | ICD-10-CM | POA: Insufficient documentation

## 2021-02-19 DIAGNOSIS — Z7982 Long term (current) use of aspirin: Secondary | ICD-10-CM | POA: Insufficient documentation

## 2021-02-19 DIAGNOSIS — I2510 Atherosclerotic heart disease of native coronary artery without angina pectoris: Secondary | ICD-10-CM | POA: Insufficient documentation

## 2021-02-19 DIAGNOSIS — I480 Paroxysmal atrial fibrillation: Secondary | ICD-10-CM

## 2021-02-19 DIAGNOSIS — R42 Dizziness and giddiness: Secondary | ICD-10-CM | POA: Diagnosis not present

## 2021-02-19 DIAGNOSIS — S0990XA Unspecified injury of head, initial encounter: Secondary | ICD-10-CM | POA: Diagnosis present

## 2021-02-19 LAB — URINALYSIS, ROUTINE W REFLEX MICROSCOPIC
Bilirubin Urine: NEGATIVE
Glucose, UA: NEGATIVE mg/dL
Ketones, ur: 15 mg/dL — AB
Leukocytes,Ua: NEGATIVE
Nitrite: NEGATIVE
Protein, ur: NEGATIVE mg/dL
Specific Gravity, Urine: 1.016 (ref 1.005–1.030)
pH: 6.5 (ref 5.0–8.0)

## 2021-02-19 LAB — BASIC METABOLIC PANEL
Anion gap: 9 (ref 5–15)
BUN: 20 mg/dL (ref 8–23)
CO2: 25 mmol/L (ref 22–32)
Calcium: 9.2 mg/dL (ref 8.9–10.3)
Chloride: 99 mmol/L (ref 98–111)
Creatinine, Ser: 0.84 mg/dL (ref 0.61–1.24)
GFR, Estimated: >60 mL/min (ref >60)
Glucose, Bld: 119 mg/dL — ABNORMAL HIGH (ref 70–99)
Potassium: 4.4 mmol/L (ref 3.5–5.1)
Sodium: 133 mmol/L — ABNORMAL LOW (ref 135–145)

## 2021-02-19 LAB — CBC
HCT: 34.3 % — ABNORMAL LOW (ref 39.0–52.0)
Hemoglobin: 11.7 g/dL — ABNORMAL LOW (ref 13.0–17.0)
MCH: 29.8 pg (ref 26.0–34.0)
MCHC: 34.1 g/dL (ref 30.0–36.0)
MCV: 87.5 fL (ref 80.0–100.0)
Platelets: 264 10*3/uL (ref 150–400)
RBC: 3.92 MIL/uL — ABNORMAL LOW (ref 4.22–5.81)
RDW: 12.5 % (ref 11.5–15.5)
WBC: 13.7 10*3/uL — ABNORMAL HIGH (ref 4.0–10.5)
nRBC: 0 % (ref 0.0–0.2)

## 2021-02-19 LAB — CBG MONITORING, ED: Glucose-Capillary: 121 mg/dL — ABNORMAL HIGH (ref 70–99)

## 2021-02-19 MED ORDER — SODIUM CHLORIDE 0.9 % IV BOLUS
500.0000 mL | Freq: Once | INTRAVENOUS | Status: AC
Start: 2021-02-19 — End: 2021-02-19
  Administered 2021-02-19: 500 mL via INTRAVENOUS

## 2021-02-19 MED ORDER — DILTIAZEM HCL-DEXTROSE 125-5 MG/125ML-% IV SOLN (PREMIX)
5.0000 mg/h | INTRAVENOUS | Status: DC
  Administered 2021-02-19: 5 mg/h via INTRAVENOUS
  Filled 2021-02-19: qty 125

## 2021-02-19 MED ORDER — AMIODARONE HCL 200 MG PO TABS: 200.0000 mg | ORAL_TABLET | Freq: Two times a day (BID) | ORAL | 0 refills | Status: DC

## 2021-02-19 MED ORDER — AMIODARONE HCL 200 MG PO TABS
200.0000 mg | ORAL_TABLET | Freq: Once | ORAL | Status: AC
  Administered 2021-02-19: 200 mg via ORAL
  Filled 2021-02-19: qty 1

## 2021-02-19 MED ORDER — DILTIAZEM LOAD VIA INFUSION
10.0000 mg | Freq: Once | INTRAVENOUS | Status: AC
  Administered 2021-02-19: 10 mg via INTRAVENOUS
  Filled 2021-02-19: qty 10

## 2021-02-19 NOTE — ED Provider Notes (Signed)
I received this patient in signout from Dr. Karle Starch.  Briefly, patient had presented with new onset atrial fibrillation and had converted after going on diltiazem drip.  At time of signout, Dr. Karle Starch had spoken with cardiology, they recommended amiodarone and follow-up with his primary cardiologist.  Patient was observed for a few hours after amnio drip was stopped and he remains in sinus rhythm.  Repeat EKG shows sinus rhythm with some PACs.  He ambulated around the department and stated he did not develop any symptoms of chest pain, shortness of breath, or near syncope.  Touched base with Dr. Johney Frame, cardiology, one more time--she states his primary cardiologist wants to hold off on anticoagulation for now and will follow up with the patient in the clinic.  Discussed amiodarone prescription with patient and family and reviewed return precautions.   Nevaeh Korte, Wenda Overland, MD 02/19/21 [phone]

## 2021-02-19 NOTE — ED Triage Notes (Signed)
Patient reports to the ER for x2 Syncopal events. Patient reports he recently had his right knee re-revision done on June 27th and has been on pain medication and a muscle relaxer. The pain medication has mad him reportedly constipated and his wife left to get him a stool softener and he has a syncopal event while she was gone and another when she returned

## 2021-02-19 NOTE — ED Notes (Signed)
Patient returned from CT and placed on cardiac monitor.

## 2021-02-19 NOTE — ED Notes (Signed)
Ambulated in the hallway with a walker.  Tolerated it well aside form his heart rate between 89 to 123.  EDP is made aware.

## 2021-02-19 NOTE — ED Provider Notes (Signed)
[facility] Provider Note  CSN: [id number] Arrival date & time: 02/19/21 1256    History Chief Complaint  Patient presents with   Loss of Consciousness    Gerald Spears is a 80 y.o. male with history of non-obstructive CAD last had cath in January has also worn a home monitor showing PACs but no other known dysrhythmias. He had a revision of a prior R knee arthroplasty on 6/27 had been doing well post-op with good progressive in PT, walking with a cane. He had been having some constipation and so today his wife went to the drug store to get him a laxative. He reports when she was gone he got up to go to the bathroom, began to feel dizzy and fell down with brief loss of consciousness. He was able to get up and into the bathroom but apparently passed out again. He denies any CP, SOB. He has had some swelling in his R leg since the surgery. Not on any post-op anticoagulation, just ASA BID. No fever, knee pain, N/V/D, abdominal pain. No neck pain or headache.    Past Medical History:  Diagnosis Date   Cancer [facility])    colon polyp   Complication of anesthesia    difficult to wake up after surgery   Coronary artery disease    DJD (degenerative joint disease)    Dr. Noemi Chapel end stages of DJD 2011   Hard of hearing    Urinary frequency    UTI (lower urinary tract infection)     Past Surgical History:  Procedure Laterality Date   ANKLE SURGERY Right    COLONOSCOPY W/ BIOPSIES AND POLYPECTOMY     ELBOW SURGERY Right    EYE SURGERY     HERNIA REPAIR     JOINT REPLACEMENT     KNEE SURGERY Right    x4   LEFT HEART CATH AND CORONARY ANGIOGRAPHY N/A 09/12/2020   Procedure: LEFT HEART CATH AND CORONARY ANGIOGRAPHY;  Surgeon: Sherren Mocha, MD;  Location: [place];  Service: Cardiovascular;  Laterality: N/A;   MULTIPLE TOOTH EXTRACTIONS     TOTAL KNEE ARTHROPLASTY Right    TOTAL KNEE ARTHROPLASTY Left 06/18/2015   Procedure: TOTAL KNEE  ARTHROPLASTY;  Surgeon: Frederik Pear, MD;  Location: [place];  Service: Orthopedics;  Laterality: Left;   TOTAL KNEE REVISION Right 02/11/2021   Procedure: RIGHT TOTAL KNEE REVISION;  Surgeon: Frederik Pear, MD;  Location: [facility];  Service: Orthopedics;  Laterality: Right;    Family History  Problem Relation Age of Onset   Heart disease Father    Heart attack Mother        age 51   Breast cancer Sister    Breast cancer Sister     Social History   Tobacco Use   Smoking status: Never   Smokeless tobacco: Never  Vaping Use   Vaping Use: Never used  Substance Use Topics   Alcohol use: No   Drug use: No     Home Medications Prior to Admission medications   Medication Sig Start Date End Date Taking? Authorizing Provider  aspirin EC 81 MG tablet Take 1 tablet (81 mg total) by mouth 2 (two) times daily. 02/11/21  Yes Joanell Rising K, PA-C  atorvastatin (LIPITOR) 10 MG tablet Take 1 tablet (10 mg total) by mouth daily. Patient taking differently: Take 10 mg by mouth every other day. 10/15/20 02/19/21 Yes O'Neal, Cassie Freer, MD  Cholecalciferol (VITAMIN D3) 50 MCG (2000  UT) TABS Take 2,000 Units by mouth daily.   Yes [provider]  docusate sodium (COLACE) 100 MG capsule Take 100 mg by mouth 2 (two) times daily.   Yes [provider]  doxycycline (VIBRA-TABS) 100 MG tablet Take 100 mg by mouth at bedtime. 05/15/15  Yes [provider]  finasteride (PROSCAR) 5 MG tablet Take 5 mg by mouth daily at 12 noon. 08/09/20  Yes [provider]  OVER THE COUNTER MEDICATION Take 25 mg by mouth at bedtime. CBD oil liquid   Yes [provider]  OVER THE COUNTER MEDICATION Apply 1 application topically daily as needed (pain). CBD salve   Yes [provider]  oxyCODONE-acetaminophen (PERCOCET/ROXICET) 5-325 MG tablet Take 1 tablet by mouth every 4 (four) hours as needed for severe pain. 02/11/21  Yes Leighton Parody, PA-C  Tamsulosin HCl (FLOMAX) 0.4  MG CAPS Take 0.4 mg by mouth 2 (two) times daily.   Yes [provider]  tiZANidine (ZANAFLEX) 2 MG tablet Take 1 tablet (2 mg total) by mouth every 6 (six) hours as needed. 02/11/21  Yes Leighton Parody, PA-C  vitamin B-12 (CYANOCOBALAMIN) 1000 MCG tablet Take 1,000 mcg by mouth every other day.   Yes [provider]  amoxicillin (AMOXIL) 500 MG capsule Take 2,000 mg by mouth See admin instructions. Take 2000 mg 1 hour prior to dental work    [provider]  CANNABIDIOL PO Take 25 mg by mouth daily. CBD softgel    [provider]  valACYclovir (VALTREX) 500 MG tablet Take 500 mg by mouth 2 (two) times daily as needed (Cold sore).  05/15/15   [provider]     Allergies    Patient has no known allergies.   Review of Systems   Review of Systems A comprehensive review of systems was completed and negative except as noted in HPI.    Physical Exam BP 128/71 (BP Location: Left Arm)   Pulse 68   Temp 98.2 F (36.8 C)   Resp 16   Ht 5\' 11"  (1.803 m)   Wt 80 kg   SpO2 97%   BMI 24.60 kg/m   Physical Exam Vitals and nursing note reviewed.  Constitutional:      Appearance: Normal appearance.  HENT:     Head: Normocephalic.     Comments: Abrasion left scalp    Nose: Nose normal.     Mouth/Throat:     Mouth: Mucous membranes are moist.  Eyes:     Extraocular Movements: Extraocular movements intact.     Conjunctiva/sclera: Conjunctivae normal.  Cardiovascular:     Rate and Rhythm: Tachycardia present. Rhythm irregular.  Pulmonary:     Effort: Pulmonary effort is normal.     Breath sounds: Normal breath sounds.  Abdominal:     General: Abdomen is flat.     Palpations: Abdomen is soft.     Tenderness: There is no abdominal tenderness.  Musculoskeletal:        General: Swelling present.     Cervical back: Neck supple. No rigidity or tenderness.     Right lower leg: Edema present.  Skin:    General: Skin is warm and dry.   Neurological:     General: No focal deficit present.     Mental Status: He is alert.  Psychiatric:        Mood and Affect: Mood normal.     ED Results / Procedures / Treatments   Labs (all labs  ordered are listed, but only abnormal results are displayed) Labs Reviewed  CBC - Abnormal; Notable for the following components:      Result Value   WBC 13.7 (*)    RBC 3.92 (*)    Hemoglobin 11.7 (*)    HCT 34.3 (*)    All other components within normal limits  URINALYSIS, ROUTINE W REFLEX MICROSCOPIC - Abnormal; Notable for the following components:   Hgb urine dipstick TRACE (*)    Ketones, ur 15 (*)    All other components within normal limits  BASIC METABOLIC PANEL - Abnormal; Notable for the following components:   Sodium 133 (*)    Glucose, Bld 119 (*)    All other components within normal limits  CBG MONITORING, ED - Abnormal; Notable for the following components:   Glucose-Capillary 121 (*)    All other components within normal limits    EKG EKG Interpretation  Date/Time:  Tuesday February 19 2021 15:12:37 EDT Ventricular Rate:  73 PR Interval:  124 QRS Duration: 107 QT Interval:  382 QTC Calculation: 421 R Axis:   -23 Text Interpretation: Sinus rhythm Atrial premature complexes Borderline left axis deviation Abnormal R-wave progression, early transition Since last tracing Atrial fibrillation NO LONGER PRESENT Confirmed by Calvert Cantor [phone]) on 02/19/2021 3:16:02 PM   Radiology CT Head Wo Contrast  Result Date: 02/19/2021 CLINICAL DATA:  Dizziness, syncope. EXAM: CT HEAD WITHOUT CONTRAST TECHNIQUE: Contiguous axial images were obtained from the base of the skull through the vertex without intravenous contrast. COMPARISON:  None. FINDINGS: Brain: Mild diffuse cortical atrophy is noted. Mild chronic ischemic white matter disease is noted. No mass effect or midline shift is noted. Ventricular size is within normal limits. There is no evidence of mass lesion, hemorrhage  or acute infarction. Vascular: No hyperdense vessel or unexpected calcification. Skull: Normal. Negative for fracture or focal lesion. Sinuses/Orbits: No acute finding. Other: None. IMPRESSION: No acute intracranial abnormality seen. Electronically Signed   By: Marijo Conception M.D.   On: 02/19/2021 13:54   US Venous Img Lower Right (DVT Study)  Result Date: 02/19/2021 CLINICAL DATA:  Right lower extremity pain status post total knee arthroplasty on 02/11/2021 EXAM: RIGHT LOWER EXTREMITY VENOUS DOPPLER ULTRASOUND TECHNIQUE: Gray-scale sonography with compression, as well as color and duplex ultrasound, were performed to evaluate the deep venous system(s) from the level of the common femoral vein through the popliteal and proximal calf veins. COMPARISON:  None. FINDINGS: VENOUS Normal compressibility of the common femoral, superficial femoral, and popliteal veins, as well as the visualized calf veins. Visualized portions of profunda femoral vein and great saphenous vein unremarkable. No filling defects to suggest DVT on grayscale or color Doppler imaging. Doppler waveforms show normal direction of venous flow, normal respiratory plasticity and response to augmentation. Limited views of the contralateral common femoral vein are unremarkable. OTHER None. Limitations: none IMPRESSION: No right lower extremity DVT Electronically Signed   By: Miachel Roux M.D.   On: 02/19/2021 15:00    Procedures .Critical Care  Date/Time: 02/19/2021 3:48 PM Performed by: Truddie Hidden, MD Authorized by: Truddie Hidden, MD   Critical care provider statement:    Critical care time (minutes):  50   Critical care time was exclusive of:  Separately billable procedures and treating other patients   Critical care was necessary to treat or prevent imminent or life-threatening deterioration of the following conditions:  Cardiac failure and circulatory failure   Critical care was time spent personally by  me on the following  activities:  Discussions with consultants, evaluation of patient's response to treatment, examination of patient, ordering and performing treatments and interventions, ordering and review of laboratory studies, ordering and review of radiographic studies, pulse oximetry, re-evaluation of patient's condition, obtaining history from patient or surrogate and review of old charts  Medications Ordered in the ED Medications  diltiazem (CARDIZEM) 1 mg/mL load via infusion 10 mg (10 mg Intravenous Bolus from Bag 02/19/21 1407)    And  diltiazem (CARDIZEM) 125 mg in dextrose 5% 125 mL (1 mg/mL) infusion (5 mg/hr Intravenous New Bag/Given 02/19/21 1407)  sodium chloride 0.9 % bolus 500 mL (500 mLs Intravenous New Bag/Given 02/19/21 1409)     MDM Rules/Calculators/A&P MDM Patient arrives after syncope x 2 in setting of new onset afib with RVR. No prior history of same, not on anticoagulation also has swelling in RLE concerning for post-op DVT. Will check labs, imaging of head and RLE and begin cardizem for rate control.   ED Course  I have reviewed the triage vital signs and the nursing notes.  Pertinent labs & imaging results that were available during my care of the patient were reviewed by me and considered in my medical decision making (see chart for details).  Clinical Course as of 02/19/21 1548  Tue Feb 19, 2021  1356 CBC with anemia, worsened from baseline and mild elevation in WBC of unclear etiology.  [CS]  6063 CT head is neg for injury [CS]  0160 BMP is unremarkable.  [CS]  1093 Patient's HR has returned to normal. On monitor appears to be back in sinus rhythm. Will recheck EKG.  [CS]  2355 Doppler is neg for DVT [CS]  7322 Spoke with Dr. Johney Frame, Cardiology. As patient is now back in NSR she does not feel that the patient would benefit from admission if he stays in NSR and is able to stand and ambulate safely. She will arrange for outpatient home monitor and clinic follow up. Recommends  Amiodarone 200mg  BID if discharged.  [CS]  0254 Care of the patient signed out to Dr. Rex Kras at the change of shift.  [CS]    Clinical Course User Index [CS] Truddie Hidden, MD    Final Clinical Impression(s) / ED Diagnoses Final diagnoses:  None    Rx / DC Orders ED Discharge Orders     None        Truddie Hidden, MD 02/19/21 [phone]

## 2021-02-19 NOTE — ED Notes (Signed)
Patient transported to CT 

## 2021-02-20 ENCOUNTER — Telehealth: Payer: Self-pay

## 2021-02-20 DIAGNOSIS — Z471 Aftercare following joint replacement surgery: Secondary | ICD-10-CM | POA: Diagnosis not present

## 2021-02-20 DIAGNOSIS — M6281 Muscle weakness (generalized): Secondary | ICD-10-CM | POA: Diagnosis not present

## 2021-02-20 DIAGNOSIS — R2689 Other abnormalities of gait and mobility: Secondary | ICD-10-CM | POA: Diagnosis not present

## 2021-02-20 DIAGNOSIS — M25561 Pain in right knee: Secondary | ICD-10-CM | POA: Diagnosis not present

## 2021-02-20 DIAGNOSIS — Z96651 Presence of right artificial knee joint: Secondary | ICD-10-CM | POA: Diagnosis not present

## 2021-02-20 NOTE — Telephone Encounter (Signed)
Called patient, he is scheduled to see Dr.O'Neal tomorrow 07/07.  Patient is aware.

## 2021-02-20 NOTE — Telephone Encounter (Signed)
-----   Message from Lacinda Axon, MD sent at 02/19/2021  3:26 PM EDT ----- This guy went to the ER with syncope found to be in rapid Afib with RVR. Converted spontaneously to NSR. Can you arrange follow-up with Dr. Audie Box in clinic and order a 2 week zio for him? I will start him on amiodarone and will defer AC to you all.   Thank you so much!!  -Gwyndolyn Kaufman

## 2021-02-21 ENCOUNTER — Encounter (HOSPITAL_BASED_OUTPATIENT_CLINIC_OR_DEPARTMENT_OTHER): Payer: Self-pay | Admitting: Cardiovascular Disease

## 2021-02-21 ENCOUNTER — Other Ambulatory Visit: Payer: Self-pay

## 2021-02-21 ENCOUNTER — Ambulatory Visit (INDEPENDENT_AMBULATORY_CARE_PROVIDER_SITE_OTHER): Payer: Medicare Other | Admitting: Cardiovascular Disease

## 2021-02-21 VITALS — BP 132/72 | HR 72 | Ht 71.0 in | Wt 179.0 lb

## 2021-02-21 DIAGNOSIS — I4819 Other persistent atrial fibrillation: Secondary | ICD-10-CM | POA: Diagnosis not present

## 2021-02-21 DIAGNOSIS — E782 Mixed hyperlipidemia: Secondary | ICD-10-CM

## 2021-02-21 DIAGNOSIS — I2510 Atherosclerotic heart disease of native coronary artery without angina pectoris: Secondary | ICD-10-CM | POA: Diagnosis not present

## 2021-02-21 MED ORDER — AMIODARONE HCL 200 MG PO TABS: ORAL_TABLET | ORAL | 0 refills | Status: DC

## 2021-02-21 MED ORDER — APIXABAN 5 MG PO TABS: 5.0000 mg | ORAL_TABLET | Freq: Two times a day (BID) | ORAL | 3 refills | Status: DC

## 2021-02-21 NOTE — Progress Notes (Signed)
Cardiology Office Note:    Date:  02/21/2021  NAME:  Gerald Spears    MRN: [id number] DOB:  16-Aug-1941   PCP:  [name], MD  Cardiologist:  Evalina Field, MD  Electrophysiologist:  None   Referring MD: [name], MD   Chief Complaint  Patient presents with   Atrial Fibrillation   History of Present Illness:    Gerald Spears is a 80 y.o. male with a hx of mild AS, non-obstructive CAD, HLD who presents for follow-up. R TKA 02/11/2021. Seen in ER 02/19/2021 while getting up to go to the bathroom. Passed out. Was in Afib with RVR on arrival to the ER. Diltiazem given and converted back to NSR. Sent out on amiodarone. Afib occurred 8 days postop. He reports today he went to the emergency room he had not had anything to eat or drink.  His wife presents with him.  Since his knee surgery he has been reluctant to drink fluids.  She apparently had gone to the grocery store and he got up to go to the bathroom.  He reports he became dizzy and lightheaded and passed out.  He denies any chest pain or shortness of breath prior to the episode.  His blood pressure was checked by his son who is a Runner, broadcasting/film/video.  Apparently his blood pressure was low.  They report that he was orthostatic.  They do not have the values but report that his blood pressure did drop considerably when he stood up.  He clearly was dehydrated.  He presented to the emergency room in rapid A. fib.  He converted back to sinus rhythm with IV diltiazem.  He was prescribed amiodarone but has not taken it until discussion with me.  EKG shows he is maintaining sinus rhythm.  He denies any further chest pain or trouble breathing.  He is clearly not drinking enough.  He reports that he is in quite a bit of pain since his surgery.  His wife is limiting his narcotics.  He overall is doing well.  Cardiovascular examination remains unchanged.  He still has mild aortic stenosis.  We discussed that his A. fib was a bit too far out to just be  contributed to postop atrial fibrillation.  I have recommended anticoagulation.  They are okay to do this.  He is not still a lot of pain.  Think a short course of amiodarone will get him through this.  He does have an apple watch.  He can monitor his rhythm at home.  Problem List 1. Aortic stenosis -mild 09/27/2018 -Vmax 2.7 m/s, MG 16 mmHG, AVA 1.46 2. Mitral regurgitation, moderate -09/27/2018 3. CAD -CAC score 682 (62nd percentile) -Apical ischemia 08/29/2020 -LHC 09/12/2020: D2 80% 4. HLD -T chol 180, LDL 106, HDL 61, TG 71 5. PACs -5.8% burden -brief ectopic atrial tachycardia  6. Atrial fibrillation, persistent 02/19/2021 -converted to NSR in ER -CHADSVASC= 3 (age, CAD)  Past Medical History: Past Medical History:  Diagnosis Date   Arrhythmia    Cancer ([place])    colon polyp   Complication of anesthesia    difficult to wake up after surgery   Coronary artery disease    DJD (degenerative joint disease)    Dr. Noemi Chapel end stages of DJD 2011   Hard of hearing    Urinary frequency    UTI (lower urinary tract infection)     Past Surgical History: Past Surgical History:  Procedure Laterality Date   ANKLE SURGERY Right  COLONOSCOPY W/ BIOPSIES AND POLYPECTOMY     ELBOW SURGERY Right    EYE SURGERY     HERNIA REPAIR     JOINT REPLACEMENT     KNEE SURGERY Right    x4   LEFT HEART CATH AND CORONARY ANGIOGRAPHY N/A 09/12/2020   Procedure: LEFT HEART CATH AND CORONARY ANGIOGRAPHY;  Surgeon: Sherren Mocha, MD;  Location: [facility];  Service: Cardiovascular;  Laterality: N/A;   MULTIPLE TOOTH EXTRACTIONS     TOTAL KNEE ARTHROPLASTY Right    TOTAL KNEE ARTHROPLASTY Left 06/18/2015   Procedure: TOTAL KNEE ARTHROPLASTY;  Surgeon: Frederik Pear, MD;  Location: [facility];  Service: Orthopedics;  Laterality: Left;   TOTAL KNEE REVISION Right 02/11/2021   Procedure: RIGHT TOTAL KNEE REVISION;  Surgeon: Frederik Pear, MD;  Location: [facility];  Service: Orthopedics;  Laterality:  Right;    Current Medications: Current Meds  Medication Sig   amiodarone (PACERONE) 200 MG tablet Take 200 mg twice daily for 7 days, then 200 mg daily for 21 days, then stop   amoxicillin (AMOXIL) 500 MG capsule Take 2,000 mg by mouth See admin instructions. Take 2000 mg 1 hour prior to dental work   apixaban (ELIQUIS) 5 MG TABS tablet Take 1 tablet (5 mg total) by mouth 2 (two) times daily.   atorvastatin (LIPITOR) 10 MG tablet Take 1 tablet (10 mg total) by mouth daily. (Patient taking differently: Take 10 mg by mouth every other day.)   CANNABIDIOL PO Take 25 mg by mouth daily. CBD softgel   Cholecalciferol (VITAMIN D3) 50 MCG (2000 UT) TABS Take 2,000 Units by mouth daily.   doxycycline (VIBRAMYCIN) 100 MG capsule Take 100 mg by mouth 2 (two) times daily.   finasteride (PROSCAR) 5 MG tablet Take 5 mg by mouth daily at 12 noon.   OVER THE COUNTER MEDICATION Take 25 mg by mouth at bedtime. CBD oil liquid   OVER THE COUNTER MEDICATION Apply 1 application topically daily as needed (pain). CBD salve   Tamsulosin HCl (FLOMAX) 0.4 MG CAPS Take 0.4 mg by mouth 2 (two) times daily.   tiZANidine (ZANAFLEX) 2 MG tablet Take 1 tablet (2 mg total) by mouth every 6 (six) hours as needed.   valACYclovir (VALTREX) 500 MG tablet Take 500 mg by mouth 2 (two) times daily as needed (Cold sore).    vitamin B-12 (CYANOCOBALAMIN) 1000 MCG tablet Take 1,000 mcg by mouth every other day.   [DISCONTINUED] amiodarone (PACERONE) 200 MG tablet Take 1 tablet (200 mg total) by mouth 2 (two) times daily.   [DISCONTINUED] aspirin EC 81 MG tablet Take 1 tablet (81 mg total) by mouth 2 (two) times daily.     Allergies:    Patient has no known allergies.   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  Tobacco Use   Smoking status: Never   Smokeless tobacco: Never  Vaping Use   Vaping Use: Never used  Substance and Sexual Activity   Alcohol use: No   Drug use: No   Sexual activity: Yes  Other Topics Concern   Not on file  Social History Narrative   Not on file   Social Determinants of Health   Financial Resource Strain: Not on  file  Food Insecurity: Not on file  Transportation Needs: Not on file  Physical Activity: Not on file  Stress: Not on file  Social Connections: Not on file     Family History: The patient's family history includes Breast cancer in his sister and sister; Heart attack in his mother; Heart disease in his father.  ROS:   All other ROS reviewed and negative. Pertinent positives noted in the HPI.    EKGs/Labs/Other Studies Reviewed:   The following studies were personally reviewed by me today:  EKG:  EKG is ordered today.  The ekg ordered today demonstrates normal sinus rhythm heart 72, PACs noted, incomplete right bundle branch block, and was personally reviewed by me.   TTE 09/27/2020   1. The left ventricle has normal systolic function of 19-37%. The cavity  size was normal. There is no increased left ventricular wall thickness.  Echo evidence of pseudonormalization in diastolic relaxation. Elevated  mean left atrial pressure.   2. The right ventricle has normal systolic function. The cavity was  normal in size. There is no increase in right ventricular wall thickness.  Right ventricular systolic pressure is mildly elevated with an estimated  pressure of 32.4 mmHg.   3. Left atrial size was mildly dilated.   4. The mitral valve is normal in structure. There is mild thickening.  Mitral valve regurgitation is mild to moderate by color flow Doppler. The  MR jet is centrally-directed.   5. The tricuspid valve is normal in structure.   6. The aortic valve is tricuspid. There is moderate thickening and  moderate calcification of the aortic  valve, with mildly decreased cusp  excursion. The calculated aortic valve area is 1.46 cm, consistent with  mild-moderate stenosis.   7. The pulmonic valve was normal in structure.   8. No evidence of left ventricular regional wall motion abnormalities.  LHC 09/12/2020  1. Left dominant coronary anatomy 2. Patent left main 3. Patent LAD with no significant stenosis, but severe second diagonal branch stenosis 4. Moderate OM stenosis, no tight stenosis in LCx distribution 5. Patent, nondominant RCA 6. Normal LVEDP  Recent Labs: 08/23/2020: BNP 98.0; TSH 3.490 02/19/2021: BUN 20; Creatinine, Ser 0.84; Hemoglobin 11.7; Platelets 264; Potassium 4.4; Sodium 133   Recent Lipid Panel No results found for: CHOL, TRIG, HDL, CHOLHDL, VLDL, LDLCALC, LDLDIRECT  Physical Exam:   VS:  BP 132/72   Pulse 72   Ht 5\' 11"  (1.803 m)   Wt 179 lb (81.2 kg)   SpO2 98%   BMI 24.97 kg/m    Wt Readings from Last 3 Encounters:  02/21/21 179 lb (81.2 kg)  02/19/21 176 lb 5.9 oz (80 kg)  02/04/21 177 lb 6.4 oz (80.5 kg)    General: Well nourished, well developed, in no acute distress Head: Atraumatic, normal size  Eyes: PEERLA, EOMI  Neck: Supple, no JVD Endocrine: No thryomegaly Cardiac: Normal S1, S2; RRR; 2 out of 6 systolic ejection murmur Lungs: Clear to auscultation bilaterally, no wheezing, rhonchi or rales  Abd: Soft, nontender, no hepatomegaly  Ext: No edema, pulses 2+ Musculoskeletal: No deformities, BUE and BLE strength normal and equal Skin: Warm and dry, no rashes   Neuro: Alert and oriented to person, place, time, and situation, CNII-XII grossly intact, no focal deficits  Psych: Normal mood and affect   ASSESSMENT:   Gerald Spears is a 80 y.o. male who presents for the following: 1. Persistent atrial fibrillation ([place])   2. Coronary artery disease  involving native coronary artery of native heart without angina pectoris   3. Mixed hyperlipidemia     PLAN:   1. Persistent  atrial fibrillation ([facility]) -CHADSVASC=3 (age, CAD).  Diagnosed with rapid A. fib in the setting of dehydration.  His syncopal event was clearly just orthostasis related.  I think A. fib may have contributed.  He has no red flag symptoms.  He does have single-vessel obstructive CAD and a small vessel.  There is really moderate.  Does not explain his symptoms.  He came to quickly.  He was profoundly orthostatic per his report. -Blood work in the ER normal.  Most recent TSH was normal in January. -He was 8 days from surgery.  I think this trigger was dehydration.  Do not believe it was postop A. fib.  I have recommended indefinite anticoagulation.  He will start Eliquis 5 mg twice daily.  He will stop aspirin. -In the event that it was possibly related to surgery as well, we will plan for 1 month of amiodarone therapy.  200 mg twice daily for 7 days.  200 mg daily for 21 days and stop.  He will monitor his rhythm with the apple watch. -I would like to repeat an echocardiogram.  He is due anyway for his aortic stenosis.  His cardiovascular examination is unchanged from prior.  2. Coronary artery disease involving native coronary artery of native heart without angina pectoris 3. Mixed hyperlipidemia -Left heart catheterization in January showed 80% second diagonal lesion.  He does have apical ischemia in that region.  Given lack of symptoms medical management was recommended.  I agree with this. -We will stop his aspirin as he will be on Eliquis. -Continue statin. -We will follow-up his cholesterol results when I see him back in 6 months.     Disposition: Return in about 6 months (around 08/24/2021).  Medication Adjustments/Labs and Tests Ordered: Current medicines are reviewed at length with the patient today.  Concerns regarding medicines are outlined above.  Orders Placed This Encounter  Procedures   EKG 12-Lead   ECHOCARDIOGRAM COMPLETE    Meds ordered this encounter  Medications   amiodarone  (PACERONE) 200 MG tablet    Sig: Take 200 mg twice daily for 7 days, then 200 mg daily for 21 days, then stop    Dispense:  35 tablet    Refill:  0   apixaban (ELIQUIS) 5 MG TABS tablet    Sig: Take 1 tablet (5 mg total) by mouth 2 (two) times daily.    Dispense:  60 tablet    Refill:  3     Patient Instructions  Medication Instructions:  Take Amiodarone 200 mg twice daily for 7 days, then take 200 mg daily for 21 days, then STOP.  Start Eliquis 5 mg twice daily  Stop Aspirin *If you need a refill on your cardiac medications before your next appointment, please call your pharmacy*   Testing/Procedures: Echocardiogram - Your physician has requested that you have an echocardiogram. Echocardiography is a painless test that uses sound waves to create images of your heart. It provides your doctor with information about the size and shape of your heart and how well your heart's chambers and valves are working. This procedure takes approximately one hour. There are no restrictions for this procedure. This will be performed at our [facility] location - [address].   Follow-Up: At [facility], you and your health needs are our priority.  As part  of our continuing mission to provide you with exceptional heart care, we have created designated Provider Care Teams.  These Care Teams include your primary Cardiologist (physician) and Advanced Practice Providers (APPs -  Physician Assistants and Nurse Practitioners) who all work together to provide you with the care you need, when you need it.  We recommend signing up for the patient portal called "MyChart".  Sign up information is provided on this After Visit Summary.  MyChart is used to connect with patients for Virtual Visits (Telemedicine).  Patients are able to view lab/test results, encounter notes, upcoming appointments, etc.  Non-urgent messages can be sent to your provider as well.   To learn more about what you can do with  MyChart, go to [email or web address].    Your next appointment:   6 month(s)  The format for your next appointment:   In Person  Provider:   Eleonore Chiquito, MD     Time Spent with Patient: I have spent a total of 35 minutes with patient reviewing hospital notes, telemetry, EKGs, labs and examining the patient as well as establishing an assessment and plan that was discussed with the patient.  > 50% of time was spent in direct patient care.  Signed, Addison Naegeli. Audie Box, MD, [place]  [address], [place], [place] [address] [phone]  02/21/2021 9:52 AM

## 2021-02-21 NOTE — Patient Instructions (Addendum)
Medication Instructions:  Take Amiodarone 200 mg twice daily for 7 days, then take 200 mg daily for 21 days, then STOP.  Start Eliquis 5 mg twice daily  Stop Aspirin *If you need a refill on your cardiac medications before your next appointment, please call your pharmacy*   Testing/Procedures: Echocardiogram - Your physician has requested that you have an echocardiogram. Echocardiography is a painless test that uses sound waves to create images of your heart. It provides your doctor with information about the size and shape of your heart and how well your heart's chambers and valves are working. This procedure takes approximately one hour. There are no restrictions for this procedure. This will be performed at our [facility] location - [address].   Follow-Up: At [facility], you and your health needs are our priority.  As part of our continuing mission to provide you with exceptional heart care, we have created designated Provider Care Teams.  These Care Teams include your primary Cardiologist (physician) and Advanced Practice Providers (APPs -  Physician Assistants and Nurse Practitioners) who all work together to provide you with the care you need, when you need it.  We recommend signing up for the patient portal called "MyChart".  Sign up information is provided on this After Visit Summary.  MyChart is used to connect with patients for Virtual Visits (Telemedicine).  Patients are able to view lab/test results, encounter notes, upcoming appointments, etc.  Non-urgent messages can be sent to your provider as well.   To learn more about what you can do with MyChart, go to [email or web address].    Your next appointment:   6 month(s)  The format for your next appointment:   In Person  Provider:   Eleonore Chiquito, MD

## 2021-02-22 DIAGNOSIS — Z471 Aftercare following joint replacement surgery: Secondary | ICD-10-CM | POA: Diagnosis not present

## 2021-02-22 DIAGNOSIS — Z96651 Presence of right artificial knee joint: Secondary | ICD-10-CM | POA: Diagnosis not present

## 2021-02-22 DIAGNOSIS — R2689 Other abnormalities of gait and mobility: Secondary | ICD-10-CM | POA: Diagnosis not present

## 2021-02-22 DIAGNOSIS — M25561 Pain in right knee: Secondary | ICD-10-CM | POA: Diagnosis not present

## 2021-02-22 DIAGNOSIS — M6281 Muscle weakness (generalized): Secondary | ICD-10-CM | POA: Diagnosis not present

## 2021-02-25 DIAGNOSIS — Z471 Aftercare following joint replacement surgery: Secondary | ICD-10-CM | POA: Diagnosis not present

## 2021-02-25 DIAGNOSIS — M6281 Muscle weakness (generalized): Secondary | ICD-10-CM | POA: Diagnosis not present

## 2021-02-25 DIAGNOSIS — M25561 Pain in right knee: Secondary | ICD-10-CM | POA: Diagnosis not present

## 2021-02-25 DIAGNOSIS — Z96651 Presence of right artificial knee joint: Secondary | ICD-10-CM | POA: Diagnosis not present

## 2021-02-25 DIAGNOSIS — R2689 Other abnormalities of gait and mobility: Secondary | ICD-10-CM | POA: Diagnosis not present

## 2021-02-27 DIAGNOSIS — R2689 Other abnormalities of gait and mobility: Secondary | ICD-10-CM | POA: Diagnosis not present

## 2021-02-27 DIAGNOSIS — Z471 Aftercare following joint replacement surgery: Secondary | ICD-10-CM | POA: Diagnosis not present

## 2021-02-27 DIAGNOSIS — Z96651 Presence of right artificial knee joint: Secondary | ICD-10-CM | POA: Diagnosis not present

## 2021-02-27 DIAGNOSIS — M6281 Muscle weakness (generalized): Secondary | ICD-10-CM | POA: Diagnosis not present

## 2021-02-27 DIAGNOSIS — M25561 Pain in right knee: Secondary | ICD-10-CM | POA: Diagnosis not present

## 2021-03-01 DIAGNOSIS — Z96651 Presence of right artificial knee joint: Secondary | ICD-10-CM | POA: Diagnosis not present

## 2021-03-01 DIAGNOSIS — M6281 Muscle weakness (generalized): Secondary | ICD-10-CM | POA: Diagnosis not present

## 2021-03-01 DIAGNOSIS — R2689 Other abnormalities of gait and mobility: Secondary | ICD-10-CM | POA: Diagnosis not present

## 2021-03-01 DIAGNOSIS — M25561 Pain in right knee: Secondary | ICD-10-CM | POA: Diagnosis not present

## 2021-03-01 DIAGNOSIS — Z471 Aftercare following joint replacement surgery: Secondary | ICD-10-CM | POA: Diagnosis not present

## 2021-03-04 DIAGNOSIS — M6281 Muscle weakness (generalized): Secondary | ICD-10-CM | POA: Diagnosis not present

## 2021-03-04 DIAGNOSIS — Z471 Aftercare following joint replacement surgery: Secondary | ICD-10-CM | POA: Diagnosis not present

## 2021-03-04 DIAGNOSIS — R2689 Other abnormalities of gait and mobility: Secondary | ICD-10-CM | POA: Diagnosis not present

## 2021-03-04 DIAGNOSIS — Z96651 Presence of right artificial knee joint: Secondary | ICD-10-CM | POA: Diagnosis not present

## 2021-03-04 DIAGNOSIS — M25561 Pain in right knee: Secondary | ICD-10-CM | POA: Diagnosis not present

## 2021-03-06 DIAGNOSIS — M25561 Pain in right knee: Secondary | ICD-10-CM | POA: Diagnosis not present

## 2021-03-06 DIAGNOSIS — Z96651 Presence of right artificial knee joint: Secondary | ICD-10-CM | POA: Diagnosis not present

## 2021-03-06 DIAGNOSIS — R2689 Other abnormalities of gait and mobility: Secondary | ICD-10-CM | POA: Diagnosis not present

## 2021-03-06 DIAGNOSIS — Z471 Aftercare following joint replacement surgery: Secondary | ICD-10-CM | POA: Diagnosis not present

## 2021-03-06 DIAGNOSIS — M6281 Muscle weakness (generalized): Secondary | ICD-10-CM | POA: Diagnosis not present

## 2021-03-08 DIAGNOSIS — R2689 Other abnormalities of gait and mobility: Secondary | ICD-10-CM | POA: Diagnosis not present

## 2021-03-08 DIAGNOSIS — Z471 Aftercare following joint replacement surgery: Secondary | ICD-10-CM | POA: Diagnosis not present

## 2021-03-08 DIAGNOSIS — Z96651 Presence of right artificial knee joint: Secondary | ICD-10-CM | POA: Diagnosis not present

## 2021-03-08 DIAGNOSIS — M6281 Muscle weakness (generalized): Secondary | ICD-10-CM | POA: Diagnosis not present

## 2021-03-08 DIAGNOSIS — M25561 Pain in right knee: Secondary | ICD-10-CM | POA: Diagnosis not present

## 2021-03-11 DIAGNOSIS — R2689 Other abnormalities of gait and mobility: Secondary | ICD-10-CM | POA: Diagnosis not present

## 2021-03-11 DIAGNOSIS — Z96651 Presence of right artificial knee joint: Secondary | ICD-10-CM | POA: Diagnosis not present

## 2021-03-11 DIAGNOSIS — Z471 Aftercare following joint replacement surgery: Secondary | ICD-10-CM | POA: Diagnosis not present

## 2021-03-11 DIAGNOSIS — M25561 Pain in right knee: Secondary | ICD-10-CM | POA: Diagnosis not present

## 2021-03-11 DIAGNOSIS — M6281 Muscle weakness (generalized): Secondary | ICD-10-CM | POA: Diagnosis not present

## 2021-03-13 ENCOUNTER — Other Ambulatory Visit (HOSPITAL_BASED_OUTPATIENT_CLINIC_OR_DEPARTMENT_OTHER): Payer: Self-pay | Admitting: Cardiovascular Disease

## 2021-03-13 DIAGNOSIS — M25561 Pain in right knee: Secondary | ICD-10-CM | POA: Diagnosis not present

## 2021-03-13 DIAGNOSIS — M6281 Muscle weakness (generalized): Secondary | ICD-10-CM | POA: Diagnosis not present

## 2021-03-13 DIAGNOSIS — R2689 Other abnormalities of gait and mobility: Secondary | ICD-10-CM | POA: Diagnosis not present

## 2021-03-13 DIAGNOSIS — Z96651 Presence of right artificial knee joint: Secondary | ICD-10-CM | POA: Diagnosis not present

## 2021-03-13 DIAGNOSIS — Z471 Aftercare following joint replacement surgery: Secondary | ICD-10-CM | POA: Diagnosis not present

## 2021-03-14 ENCOUNTER — Other Ambulatory Visit: Payer: Self-pay

## 2021-03-14 ENCOUNTER — Ambulatory Visit (HOSPITAL_COMMUNITY): Payer: Medicare Other | Attending: Cardiology

## 2021-03-14 DIAGNOSIS — I4819 Other persistent atrial fibrillation: Secondary | ICD-10-CM | POA: Diagnosis not present

## 2021-03-14 LAB — ECHOCARDIOGRAM COMPLETE
AR max vel: 1.53 cm2
AV Area VTI: 1.59 cm2
AV Area mean vel: 1.43 cm2
AV Mean grad: 32 mmHg
AV Peak grad: 46.7 mmHg
Ao pk vel: 3.42 m/s
Area-P 1/2: 2.42 cm2
MV M vel: 6.28 m/s
MV Peak grad: 157.8 mmHg
S' Lateral: 3.6 cm

## 2021-03-15 DIAGNOSIS — Z96651 Presence of right artificial knee joint: Secondary | ICD-10-CM | POA: Diagnosis not present

## 2021-03-15 DIAGNOSIS — R2689 Other abnormalities of gait and mobility: Secondary | ICD-10-CM | POA: Diagnosis not present

## 2021-03-15 DIAGNOSIS — M25561 Pain in right knee: Secondary | ICD-10-CM | POA: Diagnosis not present

## 2021-03-15 DIAGNOSIS — M6281 Muscle weakness (generalized): Secondary | ICD-10-CM | POA: Diagnosis not present

## 2021-03-15 DIAGNOSIS — Z471 Aftercare following joint replacement surgery: Secondary | ICD-10-CM | POA: Diagnosis not present

## 2021-03-21 DIAGNOSIS — U071 COVID-19: Secondary | ICD-10-CM | POA: Diagnosis not present

## 2021-03-26 DIAGNOSIS — M6281 Muscle weakness (generalized): Secondary | ICD-10-CM | POA: Diagnosis not present

## 2021-03-26 DIAGNOSIS — Z96651 Presence of right artificial knee joint: Secondary | ICD-10-CM | POA: Diagnosis not present

## 2021-03-26 DIAGNOSIS — Z471 Aftercare following joint replacement surgery: Secondary | ICD-10-CM | POA: Diagnosis not present

## 2021-03-26 DIAGNOSIS — R2689 Other abnormalities of gait and mobility: Secondary | ICD-10-CM | POA: Diagnosis not present

## 2021-03-26 DIAGNOSIS — M25561 Pain in right knee: Secondary | ICD-10-CM | POA: Diagnosis not present

## 2021-03-29 ENCOUNTER — Ambulatory Visit: Payer: Medicare Other | Admitting: Urology

## 2021-04-01 DIAGNOSIS — R2689 Other abnormalities of gait and mobility: Secondary | ICD-10-CM | POA: Diagnosis not present

## 2021-04-01 DIAGNOSIS — M6281 Muscle weakness (generalized): Secondary | ICD-10-CM | POA: Diagnosis not present

## 2021-04-01 DIAGNOSIS — Z471 Aftercare following joint replacement surgery: Secondary | ICD-10-CM | POA: Diagnosis not present

## 2021-04-01 DIAGNOSIS — M25561 Pain in right knee: Secondary | ICD-10-CM | POA: Diagnosis not present

## 2021-04-01 DIAGNOSIS — Z96651 Presence of right artificial knee joint: Secondary | ICD-10-CM | POA: Diagnosis not present

## 2021-04-08 ENCOUNTER — Other Ambulatory Visit (HOSPITAL_BASED_OUTPATIENT_CLINIC_OR_DEPARTMENT_OTHER): Payer: Self-pay | Admitting: Cardiovascular Disease

## 2021-05-01 DIAGNOSIS — M545 Low back pain, unspecified: Secondary | ICD-10-CM | POA: Diagnosis not present

## 2021-05-13 ENCOUNTER — Other Ambulatory Visit: Payer: Self-pay

## 2021-05-13 ENCOUNTER — Encounter: Payer: Self-pay | Admitting: Urology

## 2021-05-13 ENCOUNTER — Ambulatory Visit (INDEPENDENT_AMBULATORY_CARE_PROVIDER_SITE_OTHER): Payer: Medicare Other | Admitting: Urology

## 2021-05-13 VITALS — BP 105/64 | HR 68

## 2021-05-13 DIAGNOSIS — N401 Enlarged prostate with lower urinary tract symptoms: Secondary | ICD-10-CM | POA: Diagnosis not present

## 2021-05-13 DIAGNOSIS — R310 Gross hematuria: Secondary | ICD-10-CM | POA: Diagnosis not present

## 2021-05-13 DIAGNOSIS — N138 Other obstructive and reflux uropathy: Secondary | ICD-10-CM | POA: Diagnosis not present

## 2021-05-13 DIAGNOSIS — R32 Unspecified urinary incontinence: Secondary | ICD-10-CM | POA: Diagnosis not present

## 2021-05-13 DIAGNOSIS — I2510 Atherosclerotic heart disease of native coronary artery without angina pectoris: Secondary | ICD-10-CM

## 2021-05-13 LAB — URINALYSIS, ROUTINE W REFLEX MICROSCOPIC
Bilirubin, UA: NEGATIVE
Glucose, UA: NEGATIVE
Ketones, UA: NEGATIVE
Leukocytes,UA: NEGATIVE
Nitrite, UA: NEGATIVE
Protein,UA: NEGATIVE
RBC, UA: NEGATIVE
Specific Gravity, UA: 1.015 (ref 1.005–1.030)
Urobilinogen, Ur: 0.2 mg/dL (ref 0.2–1.0)
pH, UA: 6 (ref 5.0–7.5)

## 2021-05-13 LAB — BLADDER SCAN AMB NON-IMAGING: Scan Result: 38

## 2021-05-13 MED ORDER — ALFUZOSIN HCL ER 10 MG PO TB24: 10.0000 mg | ORAL_TABLET | Freq: Every day | ORAL | 11 refills | Status: DC

## 2021-05-13 MED ORDER — FINASTERIDE 5 MG PO TABS: 5.0000 mg | ORAL_TABLET | Freq: Every day | ORAL | 3 refills | Status: DC

## 2021-05-13 NOTE — Progress Notes (Signed)
post void residual=38  Urological Symptom Review  Patient is experiencing the following symptoms: Frequent urination Hard to postpone urination Get up at night to urinate Leakage of urine Blood in urine Erection problems (male only)   Review of Systems  Gastrointestinal (upper)  : Negative for upper GI symptoms  Gastrointestinal (lower) : Negative for lower GI symptoms  Constitutional : Negative for symptoms  Skin: Negative for skin symptoms  Eyes: Negative for eye symptoms  Ear/Nose/Throat : Negative for Ear/Nose/Throat symptoms  Hematologic/Lymphatic: Easy bruising  Cardiovascular : Negative for cardiovascular symptoms  Respiratory : Negative for respiratory symptoms  Endocrine: Negative for endocrine symptoms  Musculoskeletal: Back pain  Neurological: Negative for neurological symptoms  Psychologic: Negative for psychiatric symptoms

## 2021-05-13 NOTE — Progress Notes (Signed)
05/13/2021 9:13 AM   [name] 1941/01/29 [id number]  Referring provider: [name], MD Gerald Spears,  [place] [address]  Urge incontinence   HPI: [name] is a 80yo here for evaluation of BPH and urge incontinence. He previously saw Dr. Milford Cage for gross hematuria and had a hematuria workup which showed hematuria was likely due to prostatomegaly. He was started on finasteride and flomax which resolved the hematuria. He never had a cystoscopy. The patient has severe LUTS on finasteride. Urine stream is fair. Nocturia 7-8x. He has urgency and occasional urge incontinence. IPSS    PMH: Past Medical History:  Diagnosis Date   Arrhythmia    Cancer ([place])    colon polyp   Complication of anesthesia    difficult to wake up after surgery   Coronary artery disease    DJD (degenerative joint disease)    Dr. Noemi Chapel end stages of DJD 2011   Hard of hearing    Urinary frequency    UTI (lower urinary tract infection)     Surgical History: Past Surgical History:  Procedure Laterality Date   ANKLE SURGERY Right    COLONOSCOPY W/ BIOPSIES AND POLYPECTOMY     ELBOW SURGERY Right    EYE SURGERY     HERNIA REPAIR     JOINT REPLACEMENT     KNEE SURGERY Right    x4   LEFT HEART CATH AND CORONARY ANGIOGRAPHY N/A 09/12/2020   Procedure: LEFT HEART CATH AND CORONARY ANGIOGRAPHY;  Surgeon: Sherren Mocha, MD;  Location: [facility];  Service: Cardiovascular;  Laterality: N/A;   MULTIPLE TOOTH EXTRACTIONS     TOTAL KNEE ARTHROPLASTY Right    TOTAL KNEE ARTHROPLASTY Left 06/18/2015   Procedure: TOTAL KNEE ARTHROPLASTY;  Surgeon: Frederik Pear, MD;  Location: [facility];  Service: Orthopedics;  Laterality: Left;   TOTAL KNEE REVISION Right 02/11/2021   Procedure: RIGHT TOTAL KNEE REVISION;  Surgeon: Frederik Pear, MD;  Location: [facility];  Service: Orthopedics;  Laterality: Right;    Home Medications:  Allergies as of 05/13/2021   No Known Allergies       Medication List        Accurate as of May 13, 2021  9:13 AM. If you have any questions, ask your nurse or doctor.          amiodarone 200 MG tablet Commonly known as: PACERONE TAKE 1 TABLET TWICE DAILY FOR 7 DAYS, THEN 1 TABLET DAILY FOR 21 DAYS, THEN STOP   amoxicillin 500 MG capsule Commonly known as: AMOXIL Take 2,000 mg by mouth See admin instructions. Take 2000 mg 1 hour prior to dental work   apixaban 5 MG Tabs tablet Commonly known as: ELIQUIS Take 1 tablet (5 mg total) by mouth 2 (two) times daily.   atorvastatin 10 MG tablet Commonly known as: LIPITOR Take 1 tablet (10 mg total) by mouth daily. What changed: when to take this   CANNABIDIOL PO Take 25 mg by mouth daily. CBD softgel   doxycycline 100 MG capsule Commonly known as: VIBRAMYCIN Take 100 mg by mouth daily.   finasteride 5 MG tablet Commonly known as: PROSCAR Take 5 mg by mouth daily at 12 noon.   OVER THE COUNTER MEDICATION Take 25 mg by mouth at bedtime. CBD oil liquid   OVER THE COUNTER MEDICATION Apply 1 application topically daily as needed (pain). CBD salve   tamsulosin 0.4 MG Caps capsule Commonly known as: FLOMAX Take 0.4 mg  by mouth 2 (two) times daily.   tiZANidine 2 MG tablet Commonly known as: ZANAFLEX Take 1 tablet (2 mg total) by mouth every 6 (six) hours as needed.   valACYclovir 500 MG tablet Commonly known as: VALTREX Take 500 mg by mouth 2 (two) times daily as needed (Cold sore).   vitamin B-12 1000 MCG tablet Commonly known as: CYANOCOBALAMIN Take 1,000 mcg by mouth every other day.   Vitamin D3 50 MCG (2000 UT) Tabs Take 2,000 Units by mouth daily.        Allergies: No Known Allergies  Family History: Family History  Problem Relation Age of Onset   Heart disease Father    Heart attack Mother        age 65   Breast cancer Sister    Breast cancer Sister     Social History:  reports that he has never smoked. He has never used smokeless  tobacco. He reports that he does not drink alcohol and does not use drugs.  ROS: All other review of systems were reviewed and are negative except what is noted above in HPI  Physical Exam: BP 105/64   Pulse 68   Constitutional:  Alert and oriented, No acute distress. HEENT: [place] AT, moist mucus membranes.  Trachea midline, no masses. Cardiovascular: No clubbing, cyanosis, or edema. Respiratory: Normal respiratory effort, no increased work of breathing. GI: Abdomen is soft, nontender, nondistended, no abdominal masses GU: No CVA tenderness. Circumcised phallus. No masses/lesions on penis, testis, scrotum. Prostate 40g smooth no nodules no induration.  Lymph: No cervical or inguinal lymphadenopathy. Skin: No rashes, bruises or suspicious lesions. Neurologic: Grossly intact, no focal deficits, moving all 4 extremities. Psychiatric: Normal mood and affect.  Laboratory Data: Lab Results  Component Value Date   WBC 13.7 (H) 02/19/2021   HGB 11.7 (L) 02/19/2021   HCT 34.3 (L) 02/19/2021   MCV 87.5 02/19/2021   PLT 264 02/19/2021    Lab Results  Component Value Date   CREATININE 0.84 02/19/2021    No results found for: PSA  No results found for: TESTOSTERONE  No results found for: HGBA1C  Urinalysis    Component Value Date/Time   COLORURINE YELLOW 02/19/2021 1316   APPEARANCEUR CLEAR 02/19/2021 1316   LABSPEC 1.016 02/19/2021 1316   PHURINE 6.5 02/19/2021 1316   GLUCOSEU NEGATIVE 02/19/2021 1316   HGBUR TRACE (A) 02/19/2021 1316   BILIRUBINUR NEGATIVE 02/19/2021 1316   KETONESUR 15 (A) 02/19/2021 1316   PROTEINUR NEGATIVE 02/19/2021 1316   UROBILINOGEN 0.2 06/07/2015 0912   NITRITE NEGATIVE 02/19/2021 1316   LEUKOCYTESUR NEGATIVE 02/19/2021 1316    Lab Results  Component Value Date   BACTERIA RARE 04/17/2010    Pertinent Imaging:  No results found for this or any previous visit.  No results found for this or any previous visit.  No results found for this or  any previous visit.  No results found for this or any previous visit.  No results found for this or any previous visit.  No results found for this or any previous visit.  No results found for this or any previous visit.  No results found for this or any previous visit.   Assessment & Plan:    1. Urinary incontinence, unspecified type -We will start uroxatral 10mg  qhs - Urinalysis, Routine w reflex microscopic - BLADDER SCAN AMB NON-IMAGING  2. Benign prostatic hyperplasia with urinary obstruction -Uroxatral 10mg  qhs  3. Gross hematuria -continue finasteride   No follow-ups on file.  Nicolette Bang, MD  [facility] [place]

## 2021-05-13 NOTE — Patient Instructions (Signed)
Benign Prostatic Hyperplasia Benign prostatic hyperplasia (BPH) is an enlarged prostate gland that is caused by the normal aging process and not by cancer. The prostate is a walnut-sized gland that is involved in the production of semen. It is located in front of the rectum and below the bladder. The bladder stores urine and the urethra is the tube that carries the urine out of the body. The prostate may get bigger as a man gets older. An enlarged prostate can press on the urethra. This can make it harder to pass urine. The build-up of urine in the bladder can cause infection. Back pressure and infection may progress to bladder damage and kidney (renal) failure. What are the causes? This condition is part of a normal aging process. However, not all men develop problems from this condition. If the prostate enlarges away from the urethra, urine flow will not be blocked. If it enlarges toward the urethra and compresses it, there will be problems passing urine. What increases the risk? This condition is more likely to develop in men over the age of 50 years. What are the signs or symptoms? Symptoms of this condition include: Getting up often during the night to urinate. Needing to urinate frequently during the day. Difficulty starting urine flow. Decrease in size and strength of your urine stream. Leaking (dribbling) after urinating. Inability to pass urine. This needs immediate treatment. Inability to completely empty your bladder. Pain when you pass urine. This is more common if there is also an infection. Urinary tract infection (UTI). How is this diagnosed? This condition is diagnosed based on your medical history, a physical exam, and your symptoms. Tests will also be done, such as: A post-void bladder scan. This measures any amount of urine that may remain in your bladder after you finish urinating. A digital rectal exam. In a rectal exam, your health care provider checks your prostate by  putting a lubricated, gloved finger into your rectum to feel the back of your prostate gland. This exam detects the size of your gland and any abnormal lumps or growths. An exam of your urine (urinalysis). A prostate specific antigen (PSA) screening. This is a blood test used to screen for prostate cancer. An ultrasound. This test uses sound waves to electronically produce a picture of your prostate gland. Your health care provider may refer you to a specialist in kidney and prostate diseases (urologist). How is this treated? Once symptoms begin, your health care provider will monitor your condition (active surveillance or watchful waiting). Treatment for this condition will depend on the severity of your condition. Treatment may include: Observation and yearly exams. This may be the only treatment needed if your condition and symptoms are mild. Medicines to relieve your symptoms, including: Medicines to shrink the prostate. Medicines to relax the muscle of the prostate. Surgery in severe cases. Surgery may include: Prostatectomy. In this procedure, the prostate tissue is removed completely through an open incision or with a laparoscope or robotics. Transurethral resection of the prostate (TURP). In this procedure, a tool is inserted through the opening at the tip of the penis (urethra). It is used to cut away tissue of the inner core of the prostate. The pieces are removed through the same opening of the penis. This removes the blockage. Transurethral incision (TUIP). In this procedure, small cuts are made in the prostate. This lessens the prostate's pressure on the urethra. Transurethral microwave thermotherapy (TUMT). This procedure uses microwaves to create heat. The heat destroys and removes a   small amount of prostate tissue. Transurethral needle ablation (TUNA). This procedure uses radio frequencies to destroy and remove a small amount of prostate tissue. Interstitial laser coagulation (ILC).  This procedure uses a laser to destroy and remove a small amount of prostate tissue. Transurethral electrovaporization (TUVP). This procedure uses electrodes to destroy and remove a small amount of prostate tissue. Prostatic urethral lift. This procedure inserts an implant to push the lobes of the prostate away from the urethra. Follow these instructions at home: Take over-the-counter and prescription medicines only as told by your health care provider. Monitor your symptoms for any changes. Contact your health care provider with any changes. Avoid drinking large amounts of liquid before going to bed or out in public. Avoid or reduce how much caffeine or alcohol you drink. Give yourself time when you urinate. Keep all follow-up visits as told by your health care provider. This is important. Contact a health care provider if: You have unexplained back pain. Your symptoms do not get better with treatment. You develop side effects from the medicine you are taking. Your urine becomes very dark or has a bad smell. Your lower abdomen becomes distended and you have trouble passing your urine. Get help right away if: You have a fever or chills. You suddenly cannot urinate. You feel lightheaded, or very dizzy, or you faint. There are large amounts of blood or clots in the urine. Your urinary problems become hard to manage. You develop moderate to severe low back or flank pain. The flank is the side of your body between the ribs and the hip. These symptoms may represent a serious problem that is an emergency. Do not wait to see if the symptoms will go away. Get medical help right away. Call your local emergency services (911 in the [place] Do not drive yourself to the hospital. Summary Benign prostatic hyperplasia (BPH) is an enlarged prostate that is caused by the normal aging process and not by cancer. An enlarged prostate can press on the urethra. This can make it hard to pass urine. This  condition is part of a normal aging process and is more likely to develop in men over the age of 50 years. Get help right away if you suddenly cannot urinate. This information is not intended to replace advice given to you by your health care provider. Make sure you discuss any questions you have with your health care provider. Document Revised: 11/14/2020 Document Reviewed: 04/12/2020 Elsevier Patient Education  2022 [employer]vier Inc.  

## 2021-05-17 DIAGNOSIS — R059 Cough, unspecified: Secondary | ICD-10-CM | POA: Diagnosis not present

## 2021-06-10 ENCOUNTER — Ambulatory Visit (INDEPENDENT_AMBULATORY_CARE_PROVIDER_SITE_OTHER): Payer: Medicare Other | Admitting: Urology

## 2021-06-10 ENCOUNTER — Other Ambulatory Visit: Payer: Self-pay

## 2021-06-10 ENCOUNTER — Encounter: Payer: Self-pay | Admitting: Urology

## 2021-06-10 VITALS — BP 128/76 | HR 59 | Temp 98.7°F | Wt 178.0 lb

## 2021-06-10 DIAGNOSIS — R310 Gross hematuria: Secondary | ICD-10-CM | POA: Diagnosis not present

## 2021-06-10 DIAGNOSIS — I2510 Atherosclerotic heart disease of native coronary artery without angina pectoris: Secondary | ICD-10-CM

## 2021-06-10 DIAGNOSIS — R350 Frequency of micturition: Secondary | ICD-10-CM | POA: Diagnosis not present

## 2021-06-10 DIAGNOSIS — N401 Enlarged prostate with lower urinary tract symptoms: Secondary | ICD-10-CM

## 2021-06-10 DIAGNOSIS — N138 Other obstructive and reflux uropathy: Secondary | ICD-10-CM

## 2021-06-10 MED ORDER — CIPROFLOXACIN HCL 500 MG PO TABS
500.0000 mg | ORAL_TABLET | Freq: Once | ORAL | Status: AC
  Administered 2021-06-10: 500 mg via ORAL

## 2021-06-10 NOTE — Progress Notes (Signed)
06/10/2021 3:52 PM   Gerald Spears 20-Mar-1941 [id number]  Referring provider: [name], MD [place],  [place] [address]  Gross hematuria  HPI: Gerald Spears is a 80yo here for followup for BPH with urinary frequency and gross hematuria. Since last visit he stopped uroxatral due to dizziness. For the past 2 weeks he is having intermittent painless gross hematuria. IPSS 19 QOL 4. Urine stream intermittently weak. He has to strain to urinate. No other complaints today.    PMH: Past Medical History:  Diagnosis Date   Arrhythmia    Cancer ([place])    colon polyp   Complication of anesthesia    difficult to wake up after surgery   Coronary artery disease    DJD (degenerative joint disease)    Dr. Noemi Chapel end stages of DJD 2011   Hard of hearing    Urinary frequency    UTI (lower urinary tract infection)     Surgical History: Past Surgical History:  Procedure Laterality Date   ANKLE SURGERY Right    COLONOSCOPY W/ BIOPSIES AND POLYPECTOMY     ELBOW SURGERY Right    EYE SURGERY     HERNIA REPAIR     JOINT REPLACEMENT     KNEE SURGERY Right    x4   LEFT HEART CATH AND CORONARY ANGIOGRAPHY N/A 09/12/2020   Procedure: LEFT HEART CATH AND CORONARY ANGIOGRAPHY;  Surgeon: Sherren Mocha, MD;  Location: [facility];  Service: Cardiovascular;  Laterality: N/A;   MULTIPLE TOOTH EXTRACTIONS     TOTAL KNEE ARTHROPLASTY Right    TOTAL KNEE ARTHROPLASTY Left 06/18/2015   Procedure: TOTAL KNEE ARTHROPLASTY;  Surgeon: Frederik Pear, MD;  Location: [facility];  Service: Orthopedics;  Laterality: Left;   TOTAL KNEE REVISION Right 02/11/2021   Procedure: RIGHT TOTAL KNEE REVISION;  Surgeon: Frederik Pear, MD;  Location: [facility];  Service: Orthopedics;  Laterality: Right;    Home Medications:  Allergies as of 06/10/2021   No Known Allergies      Medication List        Accurate as of June 10, 2021  3:52 PM. If you have any questions, ask your nurse  or doctor.          alfuzosin 10 MG 24 hr tablet Commonly known as: UROXATRAL Take 1 tablet (10 mg total) by mouth at bedtime.   amiodarone 200 MG tablet Commonly known as: PACERONE TAKE 1 TABLET TWICE DAILY FOR 7 DAYS, THEN 1 TABLET DAILY FOR 21 DAYS, THEN STOP   amoxicillin 500 MG capsule Commonly known as: AMOXIL Take 2,000 mg by mouth See admin instructions. Take 2000 mg 1 hour prior to dental work   apixaban 5 MG Tabs tablet Commonly known as: ELIQUIS Take 1 tablet (5 mg total) by mouth 2 (two) times daily.   atorvastatin 10 MG tablet Commonly known as: LIPITOR Take 1 tablet (10 mg total) by mouth daily. What changed: when to take this   CANNABIDIOL PO Take 25 mg by mouth daily. CBD softgel   doxycycline 100 MG capsule Commonly known as: VIBRAMYCIN Take 100 mg by mouth daily.   finasteride 5 MG tablet Commonly known as: PROSCAR Take 1 tablet (5 mg total) by mouth daily at 12 noon.   OVER THE COUNTER MEDICATION Take 25 mg by mouth at bedtime. CBD oil liquid   OVER THE COUNTER MEDICATION Apply 1 application topically daily as needed (pain). CBD salve   tiZANidine 2 MG tablet  Commonly known as: ZANAFLEX Take 1 tablet (2 mg total) by mouth every 6 (six) hours as needed.   valACYclovir 500 MG tablet Commonly known as: VALTREX Take 500 mg by mouth 2 (two) times daily as needed (Cold sore).   vitamin B-12 1000 MCG tablet Commonly known as: CYANOCOBALAMIN Take 1,000 mcg by mouth every other day.   Vitamin D3 50 MCG (2000 UT) Tabs Take 2,000 Units by mouth daily.        Allergies: No Known Allergies  Family History: Family History  Problem Relation Age of Onset   Heart disease Father    Heart attack Mother        age 82   Breast cancer Sister    Breast cancer Sister     Social History:  reports that he has never smoked. He has never used smokeless tobacco. He reports that he does not drink alcohol and does not use drugs.  ROS: All other review  of systems were reviewed and are negative except what is noted above in HPI  Physical Exam: BP 128/76   Pulse (!) 59   Temp 98.7 F (37.1 C)   Wt 178 lb (80.7 kg)   BMI 24.83 kg/m   Constitutional:  Alert and oriented, No acute distress. HEENT:  AT, moist mucus membranes.  Trachea midline, no masses. Cardiovascular: No clubbing, cyanosis, or edema. Respiratory: Normal respiratory effort, no increased work of breathing. GI: Abdomen is soft, nontender, nondistended, no abdominal masses GU: No CVA tenderness.  Lymph: No cervical or inguinal lymphadenopathy. Skin: No rashes, bruises or suspicious lesions. Neurologic: Grossly intact, no focal deficits, moving all 4 extremities. Psychiatric: Normal mood and affect.  Laboratory Data: Lab Results  Component Value Date   WBC 13.7 (H) 02/19/2021   HGB 11.7 (L) 02/19/2021   HCT 34.3 (L) 02/19/2021   MCV 87.5 02/19/2021   PLT 264 02/19/2021    Lab Results  Component Value Date   CREATININE 0.84 02/19/2021    No results found for: PSA  No results found for: TESTOSTERONE  No results found for: HGBA1C  Urinalysis    Component Value Date/Time   COLORURINE YELLOW 02/19/2021 1316   APPEARANCEUR Clear 05/13/2021 0916   LABSPEC 1.016 02/19/2021 1316   PHURINE 6.5 02/19/2021 1316   GLUCOSEU Negative 05/13/2021 0916   HGBUR TRACE (A) 02/19/2021 1316   BILIRUBINUR Negative 05/13/2021 0916   KETONESUR 15 (A) 02/19/2021 1316   PROTEINUR Negative 05/13/2021 0916   PROTEINUR NEGATIVE 02/19/2021 1316   UROBILINOGEN 0.2 06/07/2015 0912   NITRITE Negative 05/13/2021 0916   NITRITE NEGATIVE 02/19/2021 1316   LEUKOCYTESUR Negative 05/13/2021 0916   LEUKOCYTESUR NEGATIVE 02/19/2021 1316    Lab Results  Component Value Date   LABMICR Comment 05/13/2021   BACTERIA RARE 04/17/2010    Pertinent Imaging:  No results found for this or any previous visit.  No results found for this or any previous visit.  No results found for this  or any previous visit.  No results found for this or any previous visit.  No results found for this or any previous visit.  No results found for this or any previous visit.  No results found for this or any previous visit.  No results found for this or any previous visit.   Assessment & Plan:    1. Gross hematuria -Likely related to varices in prostatic urethra. We discussed the management including cystoscopy with fulgeration, TURP and observation and after discussing the options the patient elects  for observation  2. Benign prostatic hyperplasia with urinary obstruction -patient to restart uroxatral 10mg  daily and continue finasteride 5mg  daily  3. Urinary frequency -Patient will restart uroxatral 10mg  daily   No follow-ups on file.  Nicolette Bang, MD  [facility]    Blood pressure 128/76, pulse (!) 59, temperature 98.7 F (37.1 C), weight 178 lb (80.7 kg). NED. A&Ox3.   No respiratory distress   Abd soft, NT, ND Normal phallus with bilateral descended testicles  Cystoscopy Procedure Note  Patient identification was confirmed, informed consent was obtained, and patient was prepped using Betadine solution.  Lidocaine jelly was administered per urethral meatus.     Pre-Procedure: - Inspection reveals a normal caliber ureteral meatus.  Procedure: The flexible cystoscope was introduced without difficulty - No urethral strictures/lesions are present. - Enlarged prostate varicose veins in prostatic urethra - Normal bladder neck - Bilateral ureteral orifices identified - Bladder mucosa  reveals no ulcers, tumors, or lesions - No bladder stones - No trabeculation  Retroflexion shows 2cm intravesical prostatic protrusion with erythema of the bladder neck   Post-Procedure: - Patient tolerated the procedure well

## 2021-06-10 NOTE — Progress Notes (Signed)
Urological Symptom Review  Patient is experiencing the following symptoms: Frequent urination Hard to postpone urination Get up at night to urinate Leakage of urine Stream starts and stops Trouble starting stream Have to strain to urinate Blood in urine Weak stream Erection problems (male only)   Review of Systems  Gastrointestinal (upper)  : Negative for upper GI symptoms  Gastrointestinal (lower) : Negative for lower GI symptoms  Constitutional : Weight loss Fatigue  Skin: Negative for skin symptoms  Eyes: Negative for eye symptoms  Ear/Nose/Throat : Negative for Ear/Nose/Throat symptoms  Hematologic/Lymphatic: Negative for Hematologic/Lymphatic symptoms  Cardiovascular : Negative for cardiovascular symptoms  Respiratory : Negative for respiratory symptoms  Endocrine: Negative for endocrine symptoms  Musculoskeletal: Back pain Joint pain  Neurological: Headaches Dizziness  Psychologic: Negative for psychiatric symptoms

## 2021-06-11 LAB — URINALYSIS, ROUTINE W REFLEX MICROSCOPIC
Bilirubin, UA: NEGATIVE
Glucose, UA: NEGATIVE
Ketones, UA: NEGATIVE
Leukocytes,UA: NEGATIVE
Nitrite, UA: NEGATIVE
Protein,UA: NEGATIVE
Specific Gravity, UA: <1.005 — ABNORMAL LOW (ref 1.005–1.030)
Urobilinogen, Ur: 0.2 mg/dL (ref 0.2–1.0)
pH, UA: 5.5 (ref 5.0–7.5)

## 2021-06-11 LAB — MICROSCOPIC EXAMINATION
Bacteria, UA: NONE SEEN
Renal Epithel, UA: NONE SEEN /hpf
WBC, UA: NONE SEEN /hpf (ref 0–5)

## 2021-06-12 ENCOUNTER — Other Ambulatory Visit (HOSPITAL_BASED_OUTPATIENT_CLINIC_OR_DEPARTMENT_OTHER): Payer: Self-pay | Admitting: Cardiovascular Disease

## 2021-06-12 NOTE — Telephone Encounter (Signed)
Prescription refill request for Eliquis received. Last office visit:o'neal 02/21/21 Scr: 0.84 02/19/21 Age: 79m Weight:80.7kg

## 2021-06-17 DIAGNOSIS — Z23 Encounter for immunization: Secondary | ICD-10-CM | POA: Diagnosis not present

## 2021-06-18 ENCOUNTER — Encounter: Payer: Self-pay | Admitting: Urology

## 2021-06-18 DIAGNOSIS — E7800 Pure hypercholesterolemia, unspecified: Secondary | ICD-10-CM | POA: Diagnosis not present

## 2021-06-18 DIAGNOSIS — I480 Paroxysmal atrial fibrillation: Secondary | ICD-10-CM | POA: Diagnosis not present

## 2021-06-18 DIAGNOSIS — Z0000 Encounter for general adult medical examination without abnormal findings: Secondary | ICD-10-CM | POA: Diagnosis not present

## 2021-06-18 DIAGNOSIS — N400 Enlarged prostate without lower urinary tract symptoms: Secondary | ICD-10-CM | POA: Diagnosis not present

## 2021-06-18 DIAGNOSIS — I2510 Atherosclerotic heart disease of native coronary artery without angina pectoris: Secondary | ICD-10-CM | POA: Diagnosis not present

## 2021-06-18 DIAGNOSIS — E559 Vitamin D deficiency, unspecified: Secondary | ICD-10-CM | POA: Diagnosis not present

## 2021-06-18 DIAGNOSIS — H903 Sensorineural hearing loss, bilateral: Secondary | ICD-10-CM | POA: Diagnosis not present

## 2021-06-18 DIAGNOSIS — M170 Bilateral primary osteoarthritis of knee: Secondary | ICD-10-CM | POA: Diagnosis not present

## 2021-06-18 DIAGNOSIS — Z131 Encounter for screening for diabetes mellitus: Secondary | ICD-10-CM | POA: Diagnosis not present

## 2021-06-18 DIAGNOSIS — E538 Deficiency of other specified B group vitamins: Secondary | ICD-10-CM | POA: Diagnosis not present

## 2021-06-18 NOTE — Patient Instructions (Signed)
Benign Prostatic Hyperplasia Benign prostatic hyperplasia (BPH) is an enlarged prostate gland that is caused by the normal aging process and not by cancer. The prostate is a walnut-sized gland that is involved in the production of semen. It is located in front of the rectum and below the bladder. The bladder stores urine and the urethra is the tube that carries the urine out of the body. The prostate may get bigger as a man gets older. An enlarged prostate can press on the urethra. This can make it harder to pass urine. The build-up of urine in the bladder can cause infection. Back pressure and infection may progress to bladder damage and kidney (renal) failure. What are the causes? This condition is part of a normal aging process. However, not all men develop problems from this condition. If the prostate enlarges away from the urethra, urine flow will not be blocked. If it enlarges toward the urethra and compresses it, there will be problems passing urine. What increases the risk? This condition is more likely to develop in men over the age of 50 years. What are the signs or symptoms? Symptoms of this condition include: Getting up often during the night to urinate. Needing to urinate frequently during the day. Difficulty starting urine flow. Decrease in size and strength of your urine stream. Leaking (dribbling) after urinating. Inability to pass urine. This needs immediate treatment. Inability to completely empty your bladder. Pain when you pass urine. This is more common if there is also an infection. Urinary tract infection (UTI). How is this diagnosed? This condition is diagnosed based on your medical history, a physical exam, and your symptoms. Tests will also be done, such as: A post-void bladder scan. This measures any amount of urine that may remain in your bladder after you finish urinating. A digital rectal exam. In a rectal exam, your health care provider checks your prostate by  putting a lubricated, gloved finger into your rectum to feel the back of your prostate gland. This exam detects the size of your gland and any abnormal lumps or growths. An exam of your urine (urinalysis). A prostate specific antigen (PSA) screening. This is a blood test used to screen for prostate cancer. An ultrasound. This test uses sound waves to electronically produce a picture of your prostate gland. Your health care provider may refer you to a specialist in kidney and prostate diseases (urologist). How is this treated? Once symptoms begin, your health care provider will monitor your condition (active surveillance or watchful waiting). Treatment for this condition will depend on the severity of your condition. Treatment may include: Observation and yearly exams. This may be the only treatment needed if your condition and symptoms are mild. Medicines to relieve your symptoms, including: Medicines to shrink the prostate. Medicines to relax the muscle of the prostate. Surgery in severe cases. Surgery may include: Prostatectomy. In this procedure, the prostate tissue is removed completely through an open incision or with a laparoscope or robotics. Transurethral resection of the prostate (TURP). In this procedure, a tool is inserted through the opening at the tip of the penis (urethra). It is used to cut away tissue of the inner core of the prostate. The pieces are removed through the same opening of the penis. This removes the blockage. Transurethral incision (TUIP). In this procedure, small cuts are made in the prostate. This lessens the prostate's pressure on the urethra. Transurethral microwave thermotherapy (TUMT). This procedure uses microwaves to create heat. The heat destroys and removes a   small amount of prostate tissue. Transurethral needle ablation (TUNA). This procedure uses radio frequencies to destroy and remove a small amount of prostate tissue. Interstitial laser coagulation (ILC).  This procedure uses a laser to destroy and remove a small amount of prostate tissue. Transurethral electrovaporization (TUVP). This procedure uses electrodes to destroy and remove a small amount of prostate tissue. Prostatic urethral lift. This procedure inserts an implant to push the lobes of the prostate away from the urethra. Follow these instructions at home: Take over-the-counter and prescription medicines only as told by your health care provider. Monitor your symptoms for any changes. Contact your health care provider with any changes. Avoid drinking large amounts of liquid before going to bed or out in public. Avoid or reduce how much caffeine or alcohol you drink. Give yourself time when you urinate. Keep all follow-up visits as told by your health care provider. This is important. Contact a health care provider if: You have unexplained back pain. Your symptoms do not get better with treatment. You develop side effects from the medicine you are taking. Your urine becomes very dark or has a bad smell. Your lower abdomen becomes distended and you have trouble passing your urine. Get help right away if: You have a fever or chills. You suddenly cannot urinate. You feel lightheaded, or very dizzy, or you faint. There are large amounts of blood or clots in the urine. Your urinary problems become hard to manage. You develop moderate to severe low back or flank pain. The flank is the side of your body between the ribs and the hip. These symptoms may represent a serious problem that is an emergency. Do not wait to see if the symptoms will go away. Get medical help right away. Call your local emergency services (911 in the [place] Do not drive yourself to the hospital. Summary Benign prostatic hyperplasia (BPH) is an enlarged prostate that is caused by the normal aging process and not by cancer. An enlarged prostate can press on the urethra. This can make it hard to pass urine. This  condition is part of a normal aging process and is more likely to develop in men over the age of 50 years. Get help right away if you suddenly cannot urinate. This information is not intended to replace advice given to you by your health care provider. Make sure you discuss any questions you have with your health care provider. Document Revised: 11/14/2020 Document Reviewed: 04/12/2020 Elsevier Patient Education  2022 [employer]vier Inc.  

## 2021-08-14 ENCOUNTER — Ambulatory Visit: Payer: Self-pay | Admitting: Urology

## 2021-09-09 ENCOUNTER — Encounter: Payer: Self-pay | Admitting: Physician Assistant

## 2021-09-09 ENCOUNTER — Other Ambulatory Visit: Payer: Self-pay

## 2021-09-09 ENCOUNTER — Ambulatory Visit (INDEPENDENT_AMBULATORY_CARE_PROVIDER_SITE_OTHER): Payer: Medicare Other | Admitting: Physician Assistant

## 2021-09-09 VITALS — BP 114/77 | HR 74 | Wt 180.2 lb

## 2021-09-09 DIAGNOSIS — N138 Other obstructive and reflux uropathy: Secondary | ICD-10-CM | POA: Diagnosis not present

## 2021-09-09 DIAGNOSIS — R350 Frequency of micturition: Secondary | ICD-10-CM | POA: Diagnosis not present

## 2021-09-09 DIAGNOSIS — N401 Enlarged prostate with lower urinary tract symptoms: Secondary | ICD-10-CM | POA: Diagnosis not present

## 2021-09-09 DIAGNOSIS — N529 Male erectile dysfunction, unspecified: Secondary | ICD-10-CM | POA: Diagnosis not present

## 2021-09-09 LAB — URINALYSIS, ROUTINE W REFLEX MICROSCOPIC
Bilirubin, UA: NEGATIVE
Glucose, UA: NEGATIVE
Ketones, UA: NEGATIVE
Leukocytes,UA: NEGATIVE
Nitrite, UA: NEGATIVE
Protein,UA: NEGATIVE
Specific Gravity, UA: 1.02 (ref 1.005–1.030)
Urobilinogen, Ur: 0.2 mg/dL (ref 0.2–1.0)
pH, UA: 5.5 (ref 5.0–7.5)

## 2021-09-09 LAB — MICROSCOPIC EXAMINATION
Bacteria, UA: NONE SEEN
Epithelial Cells (non renal): NONE SEEN /hpf (ref 0–10)
Renal Epithel, UA: NONE SEEN /hpf
WBC, UA: NONE SEEN /hpf (ref 0–5)

## 2021-09-09 NOTE — Progress Notes (Signed)
Urological Symptom Review  Patient is experiencing the following symptoms: Get up at night to urinate Erection problems (male only)   Review of Systems  Gastrointestinal (upper)  : Negative for upper GI symptoms  Gastrointestinal (lower) : Negative for lower GI symptoms  Constitutional : Negative for symptoms  Skin: Negative for skin symptoms  Eyes: Negative for eye symptoms  Ear/Nose/Throat : Negative for Ear/Nose/Throat symptoms  Hematologic/Lymphatic: Negative for Hematologic/Lymphatic symptoms  Cardiovascular : Negative for cardiovascular symptoms  Respiratory : Negative for respiratory symptoms  Endocrine: Negative for endocrine symptoms  Musculoskeletal: Negative for musculoskeletal symptoms  Neurological: Dizziness  Psychologic: Negative for psychiatric symptoms

## 2021-09-09 NOTE — Progress Notes (Signed)
09/09/2021 10:06 AM   [name] 28-Sep-1940 [id number]  Referring provider: [name], MD [place],  [place] [address]  No chief complaint on file. BPH  HPI: 09/09/21 Mr Tatar returns for follow up post cysto and hematuria eval. He continues finasteride and has resumed Uroxatral and is tolerating it well without complaints.  IPSS 7 today with a quality-of-life of 2.  He does have occasional urinary frequency and intermittency with 2 voids per night.  Additionally, he and his wife want to discuss erectile dysfunction stating that he did not tolerate Viagra in the distant past due to severe headache.  He has had atrial fibrillation in the past and is working with cardiology now to decrease or discontinue most of his cardiac medications.  He did come off of Eliquis, but takes an aspirin 81 mg daily.   IPSS=7 QOL=2 UA 0-2 RBCs PVR 77ml  06/10/21 Mr Demeyer is a 81yo here for followup for BPH with urinary frequency and gross hematuria. Since last visit he stopped uroxatral due to dizziness. For the past 2 weeks he is having intermittent painless gross hematuria. IPSS 19 QOL 4. Urine stream intermittently weak. He has to strain to urinate. No other complaints today.   PMH: Past Medical History:  Diagnosis Date   Arrhythmia    Cancer ([facility])    colon polyp   Complication of anesthesia    difficult to wake up after surgery   Coronary artery disease    DJD (degenerative joint disease)    Dr. Noemi Chapel end stages of DJD 2011   Hard of hearing    Urinary frequency    UTI (lower urinary tract infection)     Surgical History: Past Surgical History:  Procedure Laterality Date   ANKLE SURGERY Right    COLONOSCOPY W/ BIOPSIES AND POLYPECTOMY     ELBOW SURGERY Right    EYE SURGERY     HERNIA REPAIR     JOINT REPLACEMENT     KNEE SURGERY Right    x4   LEFT HEART CATH AND CORONARY ANGIOGRAPHY N/A 09/12/2020   Procedure: LEFT HEART CATH AND CORONARY  ANGIOGRAPHY;  Surgeon: Sherren Mocha, MD;  Location: [facility];  Service: Cardiovascular;  Laterality: N/A;   MULTIPLE TOOTH EXTRACTIONS     TOTAL KNEE ARTHROPLASTY Right    TOTAL KNEE ARTHROPLASTY Left 06/18/2015   Procedure: TOTAL KNEE ARTHROPLASTY;  Surgeon: Frederik Pear, MD;  Location: [facility];  Service: Orthopedics;  Laterality: Left;   TOTAL KNEE REVISION Right 02/11/2021   Procedure: RIGHT TOTAL KNEE REVISION;  Surgeon: Frederik Pear, MD;  Location: [facility];  Service: Orthopedics;  Laterality: Right;    Home Medications:  Allergies as of 09/09/2021   No Known Allergies      Medication List        Accurate as of September 09, 2021 10:06 AM. If you have any questions, ask your nurse or doctor.          alfuzosin 10 MG 24 hr tablet Commonly known as: UROXATRAL Take 1 tablet (10 mg total) by mouth at bedtime.   amiodarone 200 MG tablet Commonly known as: PACERONE TAKE 1 TABLET TWICE DAILY FOR 7 DAYS, THEN 1 TABLET DAILY FOR 21 DAYS, THEN STOP   amoxicillin 500 MG capsule Commonly known as: AMOXIL Take 2,000 mg by mouth See admin instructions. Take 2000 mg 1 hour prior to dental work   atorvastatin 10 MG tablet Commonly known as: LIPITOR  Take 1 tablet (10 mg total) by mouth daily. What changed: when to take this   CANNABIDIOL PO Take 25 mg by mouth daily. CBD softgel   doxycycline 100 MG capsule Commonly known as: VIBRAMYCIN Take 100 mg by mouth daily.   Eliquis 5 MG Tabs tablet Generic drug: apixaban TAKE 1 TABLET BY MOUTH TWICE A DAY   finasteride 5 MG tablet Commonly known as: PROSCAR Take 1 tablet (5 mg total) by mouth daily at 12 noon.   OVER THE COUNTER MEDICATION Take 25 mg by mouth at bedtime. CBD oil liquid   OVER THE COUNTER MEDICATION Apply 1 application topically daily as needed (pain). CBD salve   tiZANidine 2 MG tablet Commonly known as: ZANAFLEX Take 1 tablet (2 mg total) by mouth every 6 (six) hours as needed.   valACYclovir 500  MG tablet Commonly known as: VALTREX Take 500 mg by mouth 2 (two) times daily as needed (Cold sore).   vitamin B-12 1000 MCG tablet Commonly known as: CYANOCOBALAMIN Take 1,000 mcg by mouth every other day.   Vitamin D3 50 MCG (2000 UT) Tabs Take 2,000 Units by mouth daily.        Allergies: No Known Allergies  Family History: Family History  Problem Relation Age of Onset   Heart disease Father    Heart attack Mother        age 30   Breast cancer Sister    Breast cancer Sister     Social History:  reports that he has never smoked. He has never used smokeless tobacco. He reports that he does not drink alcohol and does not use drugs.  ROS: All other review of systems were reviewed and are negative except what is noted above in HPI  Physical Exam: There were no vitals taken for this visit.  Constitutional:  Alert and oriented, No acute distress. HEENT: [place] AT, moist mucus membranes.  Trachea midline, no masses. Cardiovascular: No clubbing, cyanosis, or edema. Respiratory: Normal respiratory effort, no increased work of breathing. GI: Abdomen is soft, nontender, nondistended, no abdominal masses GU: No CVA tenderness. Circumcised phallus. No masses/lesions on penis, testis, scrotum. Prostate 40g smooth no nodules no induration.  Lymph: No cervical or inguinal lymphadenopathy. Skin: No rashes, bruises or suspicious lesions. Neurologic: Grossly intact, no focal deficits, moving all 4 extremities. Psychiatric: Normal mood and affect.  Laboratory Data: Lab Results  Component Value Date   WBC 13.7 (H) 02/19/2021   HGB 11.7 (L) 02/19/2021   HCT 34.3 (L) 02/19/2021   MCV 87.5 02/19/2021   PLT 264 02/19/2021    Lab Results  Component Value Date   CREATININE 0.84 02/19/2021    No results found for: PSA    Urinalysis    Component Value Date/Time   COLORURINE YELLOW 02/19/2021 1316   APPEARANCEUR Clear 06/10/2021 1658   LABSPEC 1.016 02/19/2021 1316   PHURINE 6.5  02/19/2021 1316   GLUCOSEU Negative 06/10/2021 1658   HGBUR TRACE (A) 02/19/2021 1316   BILIRUBINUR Negative 06/10/2021 1658   KETONESUR 15 (A) 02/19/2021 1316   PROTEINUR Negative 06/10/2021 1658   PROTEINUR NEGATIVE 02/19/2021 1316   UROBILINOGEN 0.2 06/07/2015 0912   NITRITE Negative 06/10/2021 1658   NITRITE NEGATIVE 02/19/2021 1316   LEUKOCYTESUR Negative 06/10/2021 1658   LEUKOCYTESUR NEGATIVE 02/19/2021 1316    Lab Results  Component Value Date   LABMICR See below: 06/10/2021   WBCUA None seen 06/10/2021   LABEPIT 0-10 06/10/2021   MUCUS Present 06/10/2021   BACTERIA  None seen 06/10/2021    Pertinent Imaging:  No results found for this or any previous visit.  No results found for this or any previous visit.  No results found for this or any previous visit.  No results found for this or any previous visit.  No results found for this or any previous visit.  No results found for this or any previous visit.  No results found for this or any previous visit.  No results found for this or any previous visit.   Assessment & Plan:    1. Urinary frequency - Urinalysis, Routine w reflex microscopic - BLADDER SCAN AMB NON-IMAGING  2. Benign prostatic hyperplasia with urinary obstruction - Bladder Scan (Post Void Residual) in office; Future - Urinalysis, Routine w reflex microscopic; Future  3. Erectile dysfunction, unspecified erectile dysfunction type  He will discuss erectile function medications with his cardiology at upcoming visit and will call the office for prescription if he decides to proceed.  Continue current meds and FU in 6 months for UA and PVR.  No follow-ups on file.  [place], Berneice Heinrich, PA-C  [facility]

## 2021-10-08 DIAGNOSIS — L814 Other melanin hyperpigmentation: Secondary | ICD-10-CM | POA: Diagnosis not present

## 2021-10-08 DIAGNOSIS — L570 Actinic keratosis: Secondary | ICD-10-CM | POA: Diagnosis not present

## 2021-10-08 DIAGNOSIS — Z86006 Personal history of melanoma in-situ: Secondary | ICD-10-CM | POA: Diagnosis not present

## 2021-10-15 DIAGNOSIS — M545 Low back pain, unspecified: Secondary | ICD-10-CM | POA: Diagnosis not present

## 2021-10-16 ENCOUNTER — Telehealth: Payer: Self-pay | Admitting: Cardiovascular Disease

## 2021-10-16 NOTE — Telephone Encounter (Signed)
Patient c/o Palpitations:  High priority if patient c/o lightheadedness, shortness of breath, or chest pain ? ?How long have you had palpitations/irregular HR/ Afib? Are you having the symptoms now?  Afib 2 times last night, not at this time ? ?Are you currently experiencing lightheadedness, SOB or CP? Was lightheaded, but not at this timeih ? ?Do you have a history of afib (atrial fibrillation) or irregular heart rhythm?  ? ? ?Have you checked your BP or HR? (document readings if available): did not check it ? ?Are you experiencing any other symptoms? No-  thinks he might need medicine ? ?

## 2021-10-16 NOTE — Telephone Encounter (Signed)
-  With verbal permission, spoke to pt's wife ?-Wife indicated on 2/28, pt woke up feeling lightheaded, dizzy, and like his heart was racing. She also report he stated he felt funny. ?-Wife checked his apple watch and report they've noticed pt has been in afib 6 times that they were not aware of. ?-Currently watch report NSR ?-Wife is questioning whether pt needs a sooner appointment or restart amiodarone to help suppress episodes.  ? ?Will forward to MD for recommendations.  ? ? ? ?

## 2021-10-17 ENCOUNTER — Other Ambulatory Visit: Payer: Self-pay

## 2021-10-17 DIAGNOSIS — M545 Low back pain, unspecified: Secondary | ICD-10-CM | POA: Diagnosis not present

## 2021-10-17 MED ORDER — METOPROLOL SUCCINATE ER 25 MG PO TB24: 25.0000 mg | ORAL_TABLET | Freq: Every day | ORAL | 6 refills | Status: DC

## 2021-10-17 NOTE — Telephone Encounter (Addendum)
Spoke with wife on speaker with patient. She stated patient is in regular rhythm and feeling fine since the last episode of afib. He has dizziness that worsens when in afib. He is not drinking enough fluids. He drinks caffeinated coffee. Suggested decaf. Recommended he drink between 6 - 8 8 ounce glasses of water and to check for swelling in feet or sob. Wife states patient gets sob when climbing stairs. BP while on phone 113/65, P 52. Wife states she would like patient to be back on amiodarone. She stated that Dr. Jenetta Downer' Nori Riis wanted patient to be on long-term amiodarone, but she and patient didn't want that because she thought afib was only a one-time occurrence. Recommended that if patient goes back into afib, to take patient to the ED. She stated she would. She asked for a sooner appointment than 4/21.Her concern is should the patient be back on amiodarone. Please advise. ?

## 2021-10-17 NOTE — Telephone Encounter (Signed)
Spoke with patient and then wife to inform them of the metoprolol succinate 25 mg daily prescription. They will continue to monitor BP and P and contact our office if SBP goes below 100 and if P goes below patient's usual 52. Also, told wife Dr. Kathalene Frames nurse will look at his schedule to see if an earlier appointment can be made. Wife voiced understanding of this conversation. ?

## 2021-10-17 NOTE — Telephone Encounter (Signed)
Contacted wife- opening for Dr.O'Neal came up on March 17th at 8:00 AM.  ?Wife took this spot.  ?No other questions/concerns. ?

## 2021-10-17 NOTE — Telephone Encounter (Signed)
Patient's wife calling back to follow up. 

## 2021-10-23 DIAGNOSIS — M5442 Lumbago with sciatica, left side: Secondary | ICD-10-CM | POA: Diagnosis not present

## 2021-10-28 ENCOUNTER — Telehealth: Payer: Self-pay

## 2021-10-28 NOTE — Telephone Encounter (Signed)
Called patient, LVM we need to reschedule on 03/17 due to an emergency. Left call back number.  ? ? ?

## 2021-10-30 DIAGNOSIS — M47896 Other spondylosis, lumbar region: Secondary | ICD-10-CM | POA: Diagnosis not present

## 2021-11-01 ENCOUNTER — Ambulatory Visit: Payer: Medicare Other | Admitting: Cardiovascular Disease

## 2021-11-03 NOTE — Progress Notes (Deleted)
?Cardiology Office Note:   ?Date:  11/03/2021  ?NAME:  Gerald Spears    ?MRN: [id number] ?DOB:  [birth year]  ? ?PCP:  [name], MD  ?Cardiologist:  Evalina Field, MD  ?Electrophysiologist:  None  ? ?Referring MD: [name], MD  ? ?No chief complaint on file. ?*** ? ?History of Present Illness:   ?Gerald Spears is a 81 y.o. male with a hx of persistent Afib, moderate AS, HLD who presents for follow-up. Having Afib episodes at home.  ? ?Problem List ?1. Aortic stenosis ?-mild 09/27/2018 ?-Vmax 2.7 m/s, MG 16 mmHG, AVA 1.46 ?2. Aortic stenosis, moderate ?-Vmax 3.4 m/s, MG 32 mmHG, DI 0.35 ?-09/27/2018 ?3. CAD ?-CAC score 682 (62nd percentile) ?-Apical ischemia 08/29/2020 ?-LHC 09/12/2020: D2 80% ?4. HLD ?-T chol 180, LDL 106, HDL 61, TG 71 ?5. PACs ?-5.8% burden ?-brief ectopic atrial tachycardia  ?6. Atrial fibrillation, persistent 02/19/2021 ?-converted to NSR in ER ?-CHADSVASC= 3 (age, CAD) ? ?Past Medical History: ?Past Medical History:  ?Diagnosis Date  ? Arrhythmia   ? Cancer [facility])   ? colon polyp  ? Complication of anesthesia   ? difficult to wake up after surgery  ? Coronary artery disease   ? DJD (degenerative joint disease)   ? Dr. Noemi Chapel end stages of DJD 2011  ? Hard of hearing   ? Urinary frequency   ? UTI (lower urinary tract infection)   ? ? ?Past Surgical History: ?Past Surgical History:  ?Procedure Laterality Date  ? ANKLE SURGERY Right   ? COLONOSCOPY W/ BIOPSIES AND POLYPECTOMY    ? ELBOW SURGERY Right   ? EYE SURGERY    ? HERNIA REPAIR    ? JOINT REPLACEMENT    ? KNEE SURGERY Right   ? x4  ? LEFT HEART CATH AND CORONARY ANGIOGRAPHY N/A 09/12/2020  ? Procedure: LEFT HEART CATH AND CORONARY ANGIOGRAPHY;  Surgeon: Sherren Mocha, MD;  Location: [place] [facility];  Service: Cardiovascular;  Laterality: N/A;  ? MULTIPLE TOOTH EXTRACTIONS    ? TOTAL KNEE ARTHROPLASTY Right   ? TOTAL KNEE ARTHROPLASTY Left 06/18/2015  ? Procedure: TOTAL KNEE ARTHROPLASTY;  Surgeon: Frederik Pear, MD;   Location: [place];  Service: Orthopedics;  Laterality: Left;  ? TOTAL KNEE REVISION Right 02/11/2021  ? Procedure: RIGHT TOTAL KNEE REVISION;  Surgeon: Frederik Pear, MD;  [facility];  Service: Orthopedics;  Laterality: Right;  ? ? ?Current Medications: ?No outpatient medications have been marked as taking for the 11/05/21 encounter (Appointment) with O'Neal, Cassie Freer, MD.  ?  ? ?Allergies:    ?Patient has no known allergies.  ? ?Social History: ?Social History  ? ?Socioeconomic History  ? Marital status: Married  ?  Spouse name: Not on file  ? Number of children: 4  ? Years of education: Not on file  ? Highest education level: Not on file  ?Occupational History  ? 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 Smoking status: Never  ? Smokeless tobacco: Never  ?Vaping Use  ? Vaping Use: Never used  ?Substance and Sexual Activity  ? Alcohol use: No  ? Drug use: No  ? Sexual activity: Yes  ?Other Topics Concern  ? Not on file  ?Social History Narrative  ? Not on file  ? ?Social Determinants of Health  ? ?Financial Resource Strain: Not on file  ?Food Insecurity: Not on file  ?Transportation Needs: Not on file  ?Physical Activity: Not on file  ?Stress: Not on file  ?  Social Connections: Not on file  ?  ? ?Family History: ?The patient's ***family history includes Breast cancer in his sister and sister; Heart attack in his mother; Heart disease in his father. ? ?ROS:   ?All other ROS reviewed and negative. Pertinent positives noted in the HPI.    ? ?EKGs/Labs/Other Studies Reviewed:   ?The following studies were personally reviewed by me today: ? ?EKG:  EKG is *** ordered today.  The ekg ordered today demonstrates ***, and was personally reviewed by me.  ? ?Recent Labs: ?02/19/2021: BUN 20; Creatinine, Ser 0.84; Hemoglobin 11.7; Platelets 264; Potassium 4.4; Sodium 133  ? ?Recent Lipid Panel ?No results found for: CHOL, TRIG, HDL, CHOLHDL, VLDL, LDLCALC, LDLDIRECT ? ?Physical Exam:   ?VS:  There were no vitals taken  for this visit.   ?Wt Readings from Last 3 Encounters:  ?09/09/21 180 lb 3.2 oz (81.7 kg)  ?06/10/21 178 lb (80.7 kg)  ?02/21/21 179 lb (81.2 kg)  ?  ?General: Well nourished, well developed, in no acute distress ?Head: Atraumatic, normal size  ?Eyes: PEERLA, EOMI  ?Neck: Supple, no JVD ?Endocrine: No thryomegaly ?Cardiac: Normal S1, S2; RRR; no murmurs, rubs, or gallops ?Lungs: Clear to auscultation bilaterally, no wheezing, rhonchi or rales  ?Abd: Soft, nontender, no hepatomegaly  ?Ext: No edema, pulses 2+ ?Musculoskeletal: No deformities, BUE and BLE strength normal and equal ?Skin: Warm and dry, no rashes   ?Neuro: Alert and oriented to person, place, time, and situation, CNII-XII grossly intact, no focal deficits  ?Psych: Normal mood and affect  ? ?ASSESSMENT:   ?Gerald Spears is a 81 y.o. male who presents for the following: ?No diagnosis found. ? ?PLAN:   ?There are no diagnoses linked to this encounter. ? ?{Are you ordering a CV Procedure (e.g. stress test, cath, DCCV, TEE, etc)?   Press F2        :[id number]} ? ?Disposition: No follow-ups on file. ? ?Medication Adjustments/Labs and Tests Ordered: ?Current medicines are reviewed at length with the patient today.  Concerns regarding medicines are outlined above.  ?No orders of the defined types were placed in this encounter. ? ?No orders of the defined types were placed in this encounter. ? ? ?There are no Patient Instructions on file for this visit.  ? ?Time Spent with Patient: I have spent a total of *** minutes with patient reviewing hospital notes, telemetry, EKGs, labs and examining the patient as well as establishing an assessment and plan that was discussed with the patient.  > 50% of time was spent in direct patient care. ? ?Signed, ?Lake Bells T. Audie Box, MD, [facility] ?[place]  ?[address], [address] ?[place], [place] [address] ?[phone]  ?11/03/2021 10:30 AM    ? ?

## 2021-11-05 ENCOUNTER — Ambulatory Visit: Payer: Medicare Other | Admitting: Cardiovascular Disease

## 2021-11-05 DIAGNOSIS — I4819 Other persistent atrial fibrillation: Secondary | ICD-10-CM

## 2021-11-05 DIAGNOSIS — E782 Mixed hyperlipidemia: Secondary | ICD-10-CM

## 2021-11-05 DIAGNOSIS — I350 Nonrheumatic aortic (valve) stenosis: Secondary | ICD-10-CM

## 2021-11-05 DIAGNOSIS — I491 Atrial premature depolarization: Secondary | ICD-10-CM

## 2021-11-05 DIAGNOSIS — I2510 Atherosclerotic heart disease of native coronary artery without angina pectoris: Secondary | ICD-10-CM

## 2021-11-05 DIAGNOSIS — I471 Supraventricular tachycardia: Secondary | ICD-10-CM

## 2021-11-28 ENCOUNTER — Other Ambulatory Visit: Payer: Self-pay | Admitting: Cardiovascular Disease

## 2021-11-28 ENCOUNTER — Encounter: Payer: Self-pay | Admitting: Cardiovascular Disease

## 2021-11-28 MED ORDER — DRONEDARONE HCL 400 MG PO TABS: 400.0000 mg | ORAL_TABLET | Freq: Two times a day (BID) | ORAL | 1 refills | Status: DC

## 2021-11-28 NOTE — Telephone Encounter (Signed)
Called spoke to Mrs Lundberg. She states patient  apple watch is showing he is 16 -31% of time in Afib. ? She states  he completed the  Amiodarone dosing .  Patient is taking Metoprolol succinate 25 mg daily  since 10/16/21. ? ? Blood pressure this morning 116/64 pulse '@54'$ . ? ? Wife states patient has been taking Eliquis 5 mg once a day instead of twice a day. RN informed wife - patient should be taking Eliquis 5 mg twice a day . Wife states she will make sure he is doing that. ? ? Wife aware will defer to Dr Jenetta DownerNori Riis for further orders  ?

## 2021-11-29 ENCOUNTER — Telehealth: Payer: Self-pay

## 2021-11-29 NOTE — Telephone Encounter (Signed)
Attempted to contact patient, to notify of message below from MD.  ?Left message to call me back.  ? ?Almyra Free:  ? ?If the medication costs too much, can you call him and tell him to increase his metoprolol to 25 mg BID? He takes it once per day. Adding the evening dose will help.  ? ?To see me next week.  ? ?L[place] Bells T. Audie Box, MD, [facility]  ?[place]  ?[address]  ?[place], [place] [address]  ?[phone]  ?12:28 PM  ?

## 2021-11-29 NOTE — Telephone Encounter (Signed)
Spoke with patient, gave a verbal understanding to taking '25mg'$  of Metoprolol BID and will discuss other options at upcoming appointment. ?

## 2021-12-04 NOTE — Progress Notes (Signed)
?Cardiology Office Note:   ?Date:  12/06/2021  ?NAME:  Gerald Spears    ?MRN: [id number] ?DOB:  1941-04-14  ? ?PCP:  [name], MD  ?Cardiologist:  Evalina Field, MD  ?Electrophysiologist:  None  ? ?Referring MD: [name], MD  ? ?Chief Complaint  ?Patient presents with  ? Follow-up  ? ?History of Present Illness:   ?Gerald Spears is a 81 y.o. male with a hx of paroxysmal atrial fibrillation, hypertension, CAD who presents for follow-up.  He presents with his wife.  For the past few weeks he has had episodes of A-fib at night.  Apparently heartbeat has been rapid and racing.  He is also had some chest tightness.  We did increase his metoprolol succinate to 25 mg twice a day.  Symptoms have improved over the last 1 week.  His EKG demonstrates sinus bradycardia with an incomplete right bundle branch block.  Multaq was too expensive.  He has limited options for rhythm control given his CAD history.  I do not want him on amiodarone.  Metoprolol may be a good option for him.  He has not been taking EKGs on his Apple watch.  Unclear if he is having PACs or actual A-fib.  I have recommended a repeat monitor.  He is also due for repeat heart ultrasound.  He has moderate aortic stenosis.  Murmur still consistent with moderate disease.  He reports he is still getting short of breath with activity.  This has been constant.  He can continue to do activities such as playing golf without major limitations.  It appears that climbing stairs or doing vigorous activity can bother him.  Overall metoprolol seems to be helping.  He does need repeat evaluation.  I am a little worried about his aortic valve. ? ?Problem List ?1. Aortic stenosis ?-moderate 02/2021: Vmax  3.4 m/s, MG 32 mmHG, DI 0.28 ?2. Mitral regurgitation, moderate ?-09/27/2018 ?3. CAD ?-CAC score 682 (62nd percentile) ?-Apical ischemia 08/29/2020 ?-LHC 09/12/2020: D2 80% ?4. HLD ?-T chol 180, LDL 106, HDL 61, TG 71 ?5. PACs ?-5.8% burden ?-brief ectopic  atrial tachycardia  ?6. Atrial fibrillation, persistent 02/19/2021 ?-converted to NSR in ER ?-CHADSVASC= 3 (age, CAD) ? ?Past Medical History: ?Past Medical History:  ?Diagnosis Date  ? Arrhythmia   ? Cancer [facility])   ? colon polyp  ? Complication of anesthesia   ? difficult to wake up after surgery  ? Coronary artery disease   ? DJD (degenerative joint disease)   ? Dr. Noemi Chapel end stages of DJD 2011  ? Hard of hearing   ? Urinary frequency   ? UTI (lower urinary tract infection)   ? ? ?Past Surgical History: ?Past Surgical History:  ?Procedure Laterality Date  ? ANKLE SURGERY Right   ? COLONOSCOPY W/ BIOPSIES AND POLYPECTOMY    ? ELBOW SURGERY Right   ? EYE SURGERY    ? HERNIA REPAIR    ? JOINT REPLACEMENT    ? KNEE SURGERY Right   ? x4  ? LEFT HEART CATH AND CORONARY ANGIOGRAPHY N/A 09/12/2020  ? Procedure: LEFT HEART CATH AND CORONARY ANGIOGRAPHY;  Surgeon: Sherren Mocha, MD;  Location: [place] [facility];  Service: Cardiovascular;  Laterality: N/A;  ? MULTIPLE TOOTH EXTRACTIONS    ? TOTAL KNEE ARTHROPLASTY Right   ? TOTAL KNEE ARTHROPLASTY Left 06/18/2015  ? Procedure: TOTAL KNEE ARTHROPLASTY;  Surgeon: Frederik Pear, MD;  Location: [place];  Service: Orthopedics;  Laterality: Left;  ? TOTAL  KNEE REVISION Right 02/11/2021  ? Procedure: RIGHT TOTAL KNEE REVISION;  Surgeon: Frederik Pear, MD;  [facility];  Service: Orthopedics;  Laterality: Right;  ? ? ?Current Medications: ?Current Meds  ?Medication Sig  ? alfuzosin (UROXATRAL) 10 MG 24 hr tablet Take 1 tablet (10 mg total) by mouth at bedtime.  ? amoxicillin (AMOXIL) 500 MG capsule Take 2,000 mg by mouth See admin instructions. Take 2000 mg 1 hour prior to dental work  ? apixaban (ELIQUIS) 5 MG TABS tablet TAKE 1 TABLET BY MOUTH TWICE A DAY  ? atorvastatin (LIPITOR) 10 MG tablet Take 1 tablet (10 mg total) by mouth daily. (Patient taking differently: Take 10 mg by mouth every other day.)  ? CANNABIDIOL PO Take 25 mg by mouth daily. CBD softgel  ? Cholecalciferol  (VITAMIN D3) 50 MCG (2000 UT) TABS Take 2,000 Units by mouth daily.  ? doxycycline (VIBRAMYCIN) 100 MG capsule Take 100 mg by mouth daily.  ? finasteride (PROSCAR) 5 MG tablet Take 1 tablet (5 mg total) by mouth daily at 12 noon.  ? OVER THE COUNTER MEDICATION Take 25 mg by mouth at bedtime. CBD oil liquid  ? OVER THE COUNTER MEDICATION Apply 1 application. topically daily as needed (pain). CBD salve  ? tiZANidine (ZANAFLEX) 2 MG tablet Take 1 tablet (2 mg total) by mouth every 6 (six) hours as needed.  ? valACYclovir (VALTREX) 500 MG tablet Take 500 mg by mouth 2 (two) times daily as needed (Cold sore).   ? vitamin B-12 (CYANOCOBALAMIN) 1000 MCG tablet Take 1,000 mcg by mouth every other day.  ? [DISCONTINUED] dronedarone (MULTAQ) 400 MG tablet Take 1 tablet (400 mg total) by mouth 2 (two) times daily with a meal.  ? [DISCONTINUED] metoprolol succinate (TOPROL XL) 25 MG 24 hr tablet Take 1 tablet (25 mg total) by mouth daily.  ?  ? ?Allergies:    ?Patient has no known allergies.  ? ?Social History: ?Social History  ? ?Socioeconomic History  ? Marital status: Married  ?  Spouse name: Not on file  ? Number of children: 4  ? Years of education: Not on file  ? Highest education level: Not on file  ?Occupational History  ? 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 Smoking status: Never  ? Smokeless tobacco: Never  ?Vaping Use  ? Vaping Use: Never used  ?Substance and Sexual Activity  ? Alcohol use: No  ? Drug use: No  ? Sexual activity: Yes  ?Other Topics Concern  ? Not on file  ?Social History Narrative  ? Not on file  ? ?Social Determinants of Health  ? ?Financial Resource Strain: Not on file  ?Food Insecurity: Not on file  ?Transportation Needs: Not on file  ?Physical Activity: Not on file  ?Stress: Not on file  ?Social Connections: Not on file  ?  ? ?Family History: ?The patient's family history includes Breast cancer in his sister and sister; Heart attack in his mother; Heart disease in his father. ? ?ROS:    ?All other ROS reviewed and negative. Pertinent positives noted in the HPI.    ? ?EKGs/Labs/Other Studies Reviewed:   ?The following studies were personally reviewed by me today: ? ?EKG:  EKG is ordered today.  The ekg ordered today demonstrates sinus bradycardia heart rate 49, no acute ischemic changes, left axis deviation, and was personally reviewed by me.  ? ?TTE 03/14/2021 ? 1. Left ventricular ejection fraction, by estimation, is 55 to 60%. The  ?left ventricle  has normal function. The left ventricle has no regional  ?wall motion abnormalities. Left ventricular diastolic parameters are  ?consistent with Grade II diastolic  ?dysfunction (pseudonormalization).  ? 2. Right ventricular systolic function is normal. The right ventricular  ?size is normal. There is mildly elevated pulmonary artery systolic  ?pressure. The estimated right ventricular systolic pressure is 88.4 mmHg.  ? 3. Left atrial size was moderately dilated.  ? 4. The mitral valve is normal in structure. Mild mitral valve  ?regurgitation. No evidence of mitral stenosis.  ? 5. The aortic valve is tricuspid. Aortic valve regurgitation is not  ?visualized. Moderate aortic valve stenosis. Aortic valve mean gradient  ?measures 32.0 mmHg.  ? 6. Aortic dilatation noted. There is mild dilatation of the ascending  ?aorta, measuring 39 mm.  ? 7. The inferior vena cava is dilated in size with >50% respiratory  ?variability, suggesting right atrial pressure of 8 mmHg.  ? ?Recent Labs: ?02/19/2021: BUN 20; Creatinine, Ser 0.84; Hemoglobin 11.7; Platelets 264; Potassium 4.4; Sodium 133  ? ?Recent Lipid Panel ?No results found for: CHOL, TRIG, HDL, CHOLHDL, VLDL, LDLCALC, LDLDIRECT ? ?Physical Exam:   ?VS:  BP 104/72   Pulse (!) 49   Ht '5\' 11"'$  (1.803 m)   Wt 184 lb 12.8 oz (83.8 kg)   SpO2 98%   BMI 25.77 kg/m?    ?Wt Readings from Last 3 Encounters:  ?12/06/21 184 lb 12.8 oz (83.8 kg)  ?09/09/21 180 lb 3.2 oz (81.7 kg)  ?06/10/21 178 lb (80.7 kg)  ?   ?General: Well nourished, well developed, in no acute distress ?Head: Atraumatic, normal size  ?Eyes: PEERLA, EOMI  ?Neck: Supple, no JVD ?Endocrine: No thryomegaly ?Cardiac: Normal S1, S2; RRR; 2 out of 6 systolic ej

## 2021-12-05 NOTE — Telephone Encounter (Signed)
Patient is seeing [place] tomorrow 04/21- will discuss at visit. ?Was unable to reach patient.  ?Thanks! ? ?

## 2021-12-06 ENCOUNTER — Ambulatory Visit (INDEPENDENT_AMBULATORY_CARE_PROVIDER_SITE_OTHER): Payer: Medicare Other | Admitting: Cardiovascular Disease

## 2021-12-06 ENCOUNTER — Encounter: Payer: Self-pay | Admitting: Cardiovascular Disease

## 2021-12-06 ENCOUNTER — Ambulatory Visit (INDEPENDENT_AMBULATORY_CARE_PROVIDER_SITE_OTHER): Payer: Medicare Other

## 2021-12-06 VITALS — BP 104/72 | HR 49 | Ht 71.0 in | Wt 184.8 lb

## 2021-12-06 DIAGNOSIS — I350 Nonrheumatic aortic (valve) stenosis: Secondary | ICD-10-CM

## 2021-12-06 DIAGNOSIS — I340 Nonrheumatic mitral (valve) insufficiency: Secondary | ICD-10-CM

## 2021-12-06 DIAGNOSIS — D6869 Other thrombophilia: Secondary | ICD-10-CM | POA: Diagnosis not present

## 2021-12-06 DIAGNOSIS — I4819 Other persistent atrial fibrillation: Secondary | ICD-10-CM

## 2021-12-06 DIAGNOSIS — I2510 Atherosclerotic heart disease of native coronary artery without angina pectoris: Secondary | ICD-10-CM

## 2021-12-06 DIAGNOSIS — I491 Atrial premature depolarization: Secondary | ICD-10-CM | POA: Diagnosis not present

## 2021-12-06 DIAGNOSIS — E782 Mixed hyperlipidemia: Secondary | ICD-10-CM

## 2021-12-06 MED ORDER — METOPROLOL SUCCINATE ER 25 MG PO TB24: 25.0000 mg | ORAL_TABLET | Freq: Two times a day (BID) | ORAL | 2 refills | Status: DC

## 2021-12-06 NOTE — Progress Notes (Unsigned)
Enrolled patient for a 7 day Zio XT monitor to be mailed to patients home.  

## 2021-12-06 NOTE — Patient Instructions (Signed)
Medication Instructions:  ?Start Metoprolol twice daily  ? ?*If you need a refill on your cardiac medications before your next appointment, please call your pharmacy* ? ? ?Testing/Procedures: ?Echocardiogram - Your physician has requested that you have an echocardiogram. Echocardiography is a painless test that uses sound waves to create images of your heart. It provides your doctor with information about the size and shape of your heart and how well your heart?s chambers and valves are working. This procedure takes approximately one hour. There are no restrictions for this procedure. This will be performed at either our [facility] location - [address] [place] location [facility] 2nd floor. ? ?ZIO XT- Long Term Monitor Instructions ? ?Your physician has requested you wear a ZIO patch monitor for 7 days.  ?This is a single patch monitor. [employer] supplies one patch monitor per enrollment. Additional ?stickers are not available. Please do not apply patch if you will be having a Nuclear Stress Test,  ?Echocardiogram, Cardiac CT, MRI, or Chest Xray during the period you would be wearing the  ?monitor. The patch cannot be worn during these tests. You cannot remove and re-apply the  ?ZIO XT patch monitor.  ?Your ZIO patch monitor will be mailed 3 day USPS to your address on file. It may take 3-5 days  ?to receive your monitor after you have been enrolled.  ?Once you have received your monitor, please review the enclosed instructions. Your monitor  ?has already been registered assigning a specific monitor serial # to you. ? ?Billing and Patient Assistance Program Information ? ?We have supplied [employer] with any of your insurance information on file for billing purposes. ?[employer] offers a sliding scale Patient Assistance Program for patients that do not have  ?insurance, or whose insurance does not completely cover the cost of the ZIO monitor.  ?You must apply for the Patient Assistance  Program to qualify for this discounted rate.  ?To apply, please call [employer] at [phone], select option 4, select option 2, ask to apply for  ?Patient Assistance Program. Theodore Demark will ask your household income, and how many people  ?are in your household. They will quote your out-of-pocket cost based on that information.  ?[employer] will also be able to set up a 34-month interest-free payment plan if needed. ? ?Applying the monitor ?  ?Shave hair from upper left chest.  ?Hold abrader disc by orange tab. Rub abrader in 40 strokes over the upper left chest as  ?indicated in your monitor instructions.  ?Clean area with 4 enclosed alcohol pads. Let dry.  ?Apply patch as indicated in monitor instructions. Patch will be placed under collarbone on left  ?side of chest with arrow pointing upward.  ?Rub patch adhesive wings for 2 minutes. Remove white label marked "1". Remove the white  ?label marked "2". Rub patch adhesive wings for 2 additional minutes.  ?While looking in a mirror, press and release button in center of patch. A small green light will  ?flash 3-4 times. This will be your only indicator that the monitor has been turned on.  ?Do not shower for the first 24 hours. You may shower after the first 24 hours.  ?Press the button if you feel a symptom. You will hear a small click. Record Date, Time and  ?Symptom in the Patient Logbook.  ?When you are ready to remove the patch, follow instructions on the last 2 pages of Patient  ?Logbook. Stick patch monitor onto the last page  of Patient Logbook.  ?Place Patient Logbook in the blue and white box. Use locking tab on box and tape box closed  ?securely. The blue and white box has prepaid postage on it. Please place it in the mailbox as  ?soon as possible. Your physician should have your test results approximately 7 days after the  ?monitor has been mailed back to [facility].  ?Call [facility] at [phone] if you have questions regarding   ?your ZIO XT patch monitor. Call them immediately if you see an orange light blinking on your  ?monitor.  ?If your monitor falls off in less than 4 days, contact our Monitor department at [phone].  ?If your monitor becomes loose or falls off after 4 days call [employer] at [phone] for  ?suggestions on securing your monitor  ? ? ?Follow-Up: ?At [facility], you and your health needs are our priority.  As part of our continuing mission to provide you with exceptional heart care, we have created designated Provider Care Teams.  These Care Teams include your primary Cardiologist (physician) and Advanced Practice Providers (APPs -  Physician Assistants and Nurse Practitioners) who all work together to provide you with the care you need, when you need it. ? ?We recommend signing up for the patient portal called "MyChart".  Sign up information is provided on this After Visit Summary.  MyChart is used to connect with patients for Virtual Visits (Telemedicine).  Patients are able to view lab/test results, encounter notes, upcoming appointments, etc.  Non-urgent messages can be sent to your provider as well.   ?To learn more about what you can do with MyChart, go to [email or web address].   ? ?Your next appointment:   ?3 month(s) ? ?The format for your next appointment:   ?In Person ? ?Provider:   ?Evalina Field, MD   ? ?Important Information About Sugar ? ? ? ? ? ? ?

## 2021-12-09 DIAGNOSIS — I4819 Other persistent atrial fibrillation: Secondary | ICD-10-CM | POA: Diagnosis not present

## 2021-12-19 DIAGNOSIS — L570 Actinic keratosis: Secondary | ICD-10-CM | POA: Diagnosis not present

## 2021-12-23 ENCOUNTER — Ambulatory Visit (INDEPENDENT_AMBULATORY_CARE_PROVIDER_SITE_OTHER): Payer: Medicare Other

## 2021-12-23 DIAGNOSIS — I350 Nonrheumatic aortic (valve) stenosis: Secondary | ICD-10-CM

## 2021-12-23 DIAGNOSIS — I4819 Other persistent atrial fibrillation: Secondary | ICD-10-CM | POA: Diagnosis not present

## 2021-12-23 LAB — ECHOCARDIOGRAM COMPLETE
AR max vel: 0.75 cm2
AV Area VTI: 0.7 cm2
AV Area mean vel: 0.62 cm2
AV Mean grad: 40 mmHg
AV Peak grad: 61.5 mmHg
Ao pk vel: 3.92 m/s
Area-P 1/2: 7.66 cm2
Calc EF: 63.5 %
Radius: 0.7 cm
S' Lateral: 4.1 cm
Single Plane A2C EF: 59.6 %
Single Plane A4C EF: 64.9 %

## 2021-12-24 ENCOUNTER — Other Ambulatory Visit: Payer: Self-pay

## 2021-12-24 DIAGNOSIS — I350 Nonrheumatic aortic (valve) stenosis: Secondary | ICD-10-CM

## 2022-01-09 ENCOUNTER — Encounter: Payer: Self-pay | Admitting: Cardiovascular Disease

## 2022-01-09 ENCOUNTER — Ambulatory Visit (INDEPENDENT_AMBULATORY_CARE_PROVIDER_SITE_OTHER): Payer: Medicare Other | Admitting: Cardiovascular Disease

## 2022-01-09 VITALS — BP 110/80 | HR 46 | Ht 72.0 in | Wt 185.8 lb

## 2022-01-09 DIAGNOSIS — I350 Nonrheumatic aortic (valve) stenosis: Secondary | ICD-10-CM

## 2022-01-09 DIAGNOSIS — I25119 Atherosclerotic heart disease of native coronary artery with unspecified angina pectoris: Secondary | ICD-10-CM | POA: Diagnosis not present

## 2022-01-09 DIAGNOSIS — Z01810 Encounter for preprocedural cardiovascular examination: Secondary | ICD-10-CM

## 2022-01-09 LAB — CBC
Hematocrit: 38.7 % (ref 37.5–51.0)
Hemoglobin: 13.1 g/dL (ref 13.0–17.7)
MCH: 29.6 pg (ref 26.6–33.0)
MCHC: 33.9 g/dL (ref 31.5–35.7)
MCV: 88 fL (ref 79–97)
Platelets: 140 10*3/uL — ABNORMAL LOW (ref 150–450)
RBC: 4.42 x10E6/uL (ref 4.14–5.80)
RDW: 14 % (ref 11.6–15.4)
WBC: 6 10*3/uL (ref 3.4–10.8)

## 2022-01-09 LAB — BASIC METABOLIC PANEL
BUN/Creatinine Ratio: 21 (ref 10–24)
BUN: 22 mg/dL (ref 8–27)
CO2: 27 mmol/L (ref 20–29)
Calcium: 9.8 mg/dL (ref 8.6–10.2)
Chloride: 104 mmol/L (ref 96–106)
Creatinine, Ser: 1.03 mg/dL (ref 0.76–1.27)
Glucose: 99 mg/dL (ref 70–99)
Potassium: 5.4 mmol/L — ABNORMAL HIGH (ref 3.5–5.2)
Sodium: 137 mmol/L (ref 134–144)
eGFR: 73 mL/min/{1.73_m2} (ref >59)

## 2022-01-09 MED ORDER — METOPROLOL SUCCINATE ER 25 MG PO TB24: 25.0000 mg | ORAL_TABLET | Freq: Every day | ORAL | 3 refills | Status: DC

## 2022-01-09 NOTE — Progress Notes (Signed)
Cardiology Office Note:    Date:  01/09/2022   ID:  Gerald Spears, DOB 04/30/1941, MRN [id number]  PCP:  Harris, William, MD   [facility] Providers Cardiologist:  Luray T O'Neal, MD     Referring MD: Harris, William, MD   Chief Complaint  Patient presents with   Aortic Stenosis    History of Present Illness:    Gerald Spears is a 81 y.o. male referred by Dr O'Neal for evaluation of aortic stenosis.   The patient underwent cardiac catheterization in January 2022 after presenting with exertional dyspnea and having an abnormal nuclear stress test with a reversible perfusion defect in the lateral apex. Cardiac cath demonstrated severe diagonal branch stenosis but no other obstructive epicardial CAD.  Medical therapy was recommended at that time.  He has had fairly rapid progression of aortic stenosis over the past few years.In 2020 the mean transaortic gradient was 16 mmHg with a dimensionless index of 0.34.  The mean transaortic gradient in 2022 and increased to 32 mmHg with a dimensionless index of 0.35.  His recent echo shows peak and mean gradients of 62 and 40 mmHg, respectively, with a dimensionless index of 0.17 and calculated valve area of 0.7 cm.  Last year, the patient underwent revision of the left knee replacement.  After surgery, he had COVID-pneumonia and also began having issues with paroxysmal atrial fibrillation.  He really has not done well since that time.  He remains functionally independent, but reports progressive symptoms of exertional dyspnea and fatigue.  He is now quite limited and he feels exhausted after playing golf or walking up a single flight of stairs.  He has to rest much more frequently.  He has lightheadedness with physical activity but he has not experienced frank syncope.  He describes exertional chest pressure as well as exertional dyspnea.  He does not have orthopnea or PND.  The patient is also developed mild cognitive dysfunction over the  past year and his wife reports that his memory has declined.  He has remained remarkably active and continues to do work in his yard and play golf.  [place] in [place].  Again, he is much more fatigued with any physical activity.  Past Medical History:  Diagnosis Date   Arrhythmia    Cancer (HCC)    colon polyp   Complication of anesthesia    difficult to wake up after surgery   Coronary artery disease    DJD (degenerative joint disease)    Dr. Wainer end stages of DJD 2011   Hard of hearing    Urinary frequency    UTI (lower urinary tract infection)     Past Surgical History:  Procedure Laterality Date   ANKLE SURGERY Right    COLONOSCOPY W/ BIOPSIES AND POLYPECTOMY     ELBOW SURGERY Right    EYE SURGERY     HERNIA REPAIR     JOINT REPLACEMENT     KNEE SURGERY Right    x4   LEFT HEART CATH AND CORONARY ANGIOGRAPHY N/A 09/12/2020   Procedure: LEFT HEART CATH AND CORONARY ANGIOGRAPHY;  Surgeon: Grettel Rames, MD;  Location: [facility];  Service: Cardiovascular;  Laterality: N/A;   MULTIPLE TOOTH EXTRACTIONS     TOTAL KNEE ARTHROPLASTY Right    TOTAL KNEE ARTHROPLASTY Left 06/18/2015   Procedure: TOTAL KNEE ARTHROPLASTY;  Surgeon: Frank Rowan, MD;  Location: [facility] OR;  Service: Orthopedics;  Laterality: Left;   TOTAL   KNEE REVISION Right 02/11/2021   Procedure: RIGHT TOTAL KNEE REVISION;  Surgeon: Frederik Pear, MD;  Location: [facility];  Service: Orthopedics;  Laterality: Right;    Current Medications: Current Meds  Medication Sig   alfuzosin (UROXATRAL) 10 MG 24 hr tablet Take 1 tablet (10 mg total) by mouth at bedtime.   amoxicillin (AMOXIL) 500 MG capsule Take 2,000 mg by mouth See admin instructions. Take 2000 mg 1 hour prior to dental work   apixaban (ELIQUIS) 5 MG TABS tablet TAKE 1 TABLET BY MOUTH TWICE A DAY   atorvastatin (LIPITOR) 10 MG tablet Take 1 tablet (10 mg total) by mouth daily. (Patient taking differently: Take 10 mg by  mouth 3 (three) times a week.)   Cholecalciferol (VITAMIN D3) 50 MCG (2000 UT) TABS Take by mouth daily. Patient taking 1,000 units   doxycycline (VIBRAMYCIN) 100 MG capsule Take 100 mg by mouth daily.   finasteride (PROSCAR) 5 MG tablet Take 1 tablet (5 mg total) by mouth daily at 12 noon.   metoprolol succinate (TOPROL XL) 25 MG 24 hr tablet Take 1 tablet (25 mg total) by mouth daily.   valACYclovir (VALTREX) 500 MG tablet Take 500 mg by mouth 2 (two) times daily as needed (Cold sore).    vitamin B-12 (CYANOCOBALAMIN) 1000 MCG tablet Take 1,000 mcg by mouth every other day.   [DISCONTINUED] metoprolol succinate (TOPROL XL) 25 MG 24 hr tablet Take 1 tablet (25 mg total) by mouth 2 (two) times daily.     Allergies:   Patient has no known allergies.   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  Tobacco Use   Smoking status: Never   Smokeless tobacco: Never  Vaping Use   Vaping Use: Never used  Substance and Sexual Activity   Alcohol use: No   Drug use: No   Sexual activity: Yes  Other Topics Concern   Not on file  Social History Narrative   Not on file   Social Determinants of Health   Financial Resource Strain: Not on file  Food Insecurity: Not on file  Transportation Needs: Not on file  Physical Activity: Not on file  Stress: Not on file  Social Connections: Not on file     Family History: The patient's family history includes Breast cancer in his sister and sister; Heart attack in his mother; Heart disease in his father.  ROS:   Please see the history of present illness.    All other systems reviewed and are negative.  EKGs/Labs/Other Studies Reviewed:    The following studies were reviewed today: Echo: 1. Left ventricular ejection fraction, by estimation, is  60 to 65%. The  left ventricle has normal function. The left ventricle has no regional  wall motion abnormalities. There is mild concentric left ventricular  hypertrophy. Left ventricular diastolic  parameters are consistent with Grade II diastolic dysfunction  (pseudonormalization). Elevated left ventricular end-diastolic pressure.   2. Right ventricular systolic function is normal. The right ventricular  size is normal. There is moderately elevated pulmonary artery systolic  pressure. The estimated right ventricular systolic pressure is 73.4 mmHg.   3. Left atrial size was severely dilated.   4. The mitral valve is degenerative. Moderate mitral valve regurgitation.  No evidence of  mitral stenosis.   5. Tricuspid valve regurgitation is mild to moderate.   6. The aortic valve is calcified. There is severe calcifcation of the  aortic valve. There is moderate thickening of the aortic valve. Aortic  valve regurgitation is not visualized. Severe aortic valve stenosis.  Aortic valve mean gradient measures 40.0  mmHg. Aortic valve Vmax measures 3.92 m/s.   7. Aortic dilatation noted. There is mild dilatation of the ascending  aorta, measuring 38 mm.   8. The inferior vena cava is normal in size with greater than 50%  respiratory variability, suggesting right atrial pressure of 3 mmHg.   Comparison(s): EF 55%, asc aor 14m, mod AS mean 349mg, peak 46.30m55m,  mild MR, mild MAC, RVSP 38.5mm69m Compared to study dated 03/14/2021 the  DVI has decreased from 0.35 to 0.17 and AVA has decreased from 1.59cm2 to  0.7cm2 AS is now severe.   LEFT VENTRICLE  PLAX 2D  LVIDd:         5.74 cm      Diastology  LVIDs:         4.10 cm      LV e' medial:    5.43 cm/s  LV PW:         1.28 cm      LV E/e' medial:  22.1  LV IVS:        1.20 cm      LV e' lateral:   4.31 cm/s  LVOT diam:     2.30 cm      LV E/e' lateral: 27.8  LV SV:         72  LV SV Index:   35  LVOT Area:     4.15 cm     LV Volumes  (MOD)  LV vol d, MOD A2C: 103.0 ml  LV vol d, MOD A4C: 89.5 ml  LV vol s, MOD A2C: 41.6 ml  LV vol s, MOD A4C: 31.4 ml  LV SV MOD A2C:     61.4 ml  LV SV MOD A4C:     89.5 ml  LV SV MOD BP:      63.3 ml   RIGHT VENTRICLE  RV S prime:     17.60 cm/s  TAPSE (M-mode): 2.8 cm   LEFT ATRIUM              Index        RIGHT ATRIUM           Index  LA diam:        4.90 cm  2.40 cm/m   RA Area:     17.90 cm  LA Vol (A2C):   134.0 ml 65.72 ml/m  RA Volume:   44.60 ml  21.87 ml/m  LA Vol (A4C):   95.1 ml  46.64 ml/m  LA Biplane Vol: 115.0 ml 56.40 ml/m   AORTIC VALVE                     PULMONIC VALVE  AV Area (Vmax):    0.75 cm      PV Vmax:          1.04 m/s  AV Area (Vmean):   0.62 cm      PV Peak grad:     4.3 mmHg  AV Area (VTI):     0.70 cm      PR End Diast Vel: 7.29 msec  AV Vmax:  392.00 cm/s  AV Vmean:          306.000 cm/s  AV VTI:            1.026 m  AV Peak Grad:      61.5 mmHg  AV Mean Grad:      40.0 mmHg  LVOT Vmax:         70.60 cm/s  LVOT Vmean:        45.500 cm/s  LVOT VTI:          0.174 m  LVOT/AV VTI ratio: 0.17     AORTA  Ao Root diam: 3.50 cm  Ao Asc diam:  3.90 cm  Ao Arch diam: 3.4 cm   MITRAL VALVE                TRICUSPID VALVE  MV Area (PHT): 7.66 cm     TR Peak grad:   45.2 mmHg  MV Decel Time: 99 msec      TR Vmax:        336.00 cm/s  MR PISA:        3.08 cm  MR PISA Radius: 0.70 cm     SHUNTS  MV E velocity: 120.00 cm/s  Systemic VTI:  0.17 m  MV A velocity: 96.20 cm/s   Systemic Diam: 2.30 cm  MV E/A ratio:  1.25   Cardiac Cath 09/12/2020: 1. Left dominant coronary anatomy 2. Patent left main 3. Patent LAD with no significant stenosis, but severe second diagonal branch stenosis 4. Moderate OM stenosis, no tight stenosis in LCx distribution 5. Patent, nondominant RCA 6. Normal LVEDP   Recommend: medical therapy, branch vessel CAD in patient without angina. Low risk nuclear stress test but nuclear defect fits coronary  stenosis in second diagonal. PCI technically feasible if patient fails med Rx.  EKG:  EKG is ordered today.  The ekg ordered today demonstrates marked sinus bradycardia 46 bpm with premature supraventricular beats, moderate voltage criteria for LVH may be normal variant.  Recent Labs: 01/09/2022: BUN 22; Creatinine, Ser 1.03; Hemoglobin 13.1; Platelets 140; Potassium 5.4; Sodium 137  Recent Lipid Panel No results found for: CHOL, TRIG, HDL, CHOLHDL, VLDL, LDLCALC, LDLDIRECT   Risk Assessment/Calculations:    CHA2DS2-VASc Score = 4   This indicates a 4.8% annual risk of stroke. The patient's score is based upon: CHF History: 0 HTN History: 1 Diabetes History: 0 Stroke History: 0 Vascular Disease History: 1 Age Score: 2 Gender Score: 0          Physical Exam:    VS:  BP 110/80   Pulse (!) 46   Ht 6' (1.829 m)   Wt 185 lb 12.8 oz (84.3 kg)   SpO2 98%   BMI 25.20 kg/m     Wt Readings from Last 3 Encounters:  01/09/22 185 lb 12.8 oz (84.3 kg)  12/06/21 184 lb 12.8 oz (83.8 kg)  09/09/21 180 lb 3.2 oz (81.7 kg)     GEN:  Well nourished, well developed pleasant elderly male in no acute distress HEENT: Normal NECK: No JVD; No carotid bruits LYMPHATICS: No lymphadenopathy CARDIAC: RRR, 3/6 harsh systolic murmur at the right upper sternal border, late peaking with absent A2 RESPIRATORY:  Clear to auscultation without rales, wheezing or rhonchi  ABDOMEN: Soft, non-tender, non-distended MUSCULOSKELETAL:  No edema; No deformity  SKIN: Warm and dry NEUROLOGIC:  Alert and oriented x 3 PSYCHIATRIC:  Normal affect   ASSESSMENT:    1. Nonrheumatic aortic valve stenosis  2. Preprocedural cardiovascular examination   3. Coronary artery disease involving native coronary artery of native heart with angina pectoris ([place])    PLAN:    In order of problems listed above:  The patient has severe, stage D1 aortic stenosis with progressive New York Heart Association functional  class III symptoms of exertional dyspnea, exertional chest pressure, and fatigue.  I have personally reviewed his echo study which demonstrates severe calcification and restriction of the aortic valve leaflets with Doppler findings consistent with severe aortic stenosis.  The patient's mean transaortic gradient is 40 mmHg with a dimensionless index less than 0.25 and a calculated aortic valve area of 0.7 cm.  I reviewed the natural history of symptomatic aortic stenosis with the patient and his wife today.  We discussed specific treatment options in the context of his known medical comorbidities.  Treatment options include conventional aortic valve replacement, TAVR, or palliative medical therapy.  Considering the patient's age, mild cognitive impairment, and otherwise functional independence, he appears to be a good candidate for TAVR pending further evaluation of his coronary artery disease and vascular anatomy.  I have recommended repeat cardiac catheterization since his angina has progressed and he has known obstructive disease of the diagonal branch of the LAD. I have reviewed the risks, indications, and alternatives to cardiac catheterization, possible angioplasty, and stenting with the patient. Risks include but are not limited to bleeding, infection, vascular injury, stroke, myocardial infection, arrhythmia, kidney injury, radiation-related injury in the case of prolonged fluoroscopy use, emergency cardiac surgery, and death. The patient understands the risks of serious complication is 1-2 in 3536 with diagnostic cardiac cath and 1-2% or less with angioplasty/stenting.  The patient will also undergo a gated cardiac CTA (TAVR protocol) and a CTA of the chest, abdomen, and pelvis to define his vascular access and aortic valve anatomy.  Once his cardiac catheterization and CTA studies are completed, he will be referred to Dr. Cyndia Bent for formal cardiac surgical consultation as part of a multidisciplinary  approach to his care.      Shared Decision Making/Informed Consent The risks [stroke (1 in 1000), death (1 in 1000), kidney failure [usually temporary] (1 in 500), bleeding (1 in 200), allergic reaction [possibly serious] (1 in 200)], benefits (diagnostic support and management of coronary artery disease) and alternatives of a cardiac catheterization were discussed in detail with Mr. Dancel and he is willing to proceed.    Medication Adjustments/Labs and Tests Ordered: Current medicines are reviewed at length with the patient today.  Concerns regarding medicines are outlined above.  Orders Placed This Encounter  Procedures   CT ANGIO CHEST AORTA W/CM & OR WO/CM   CT ANGIO ABDOMEN PELVIS  W &/OR WO CONTRAST   CT CORONARY MORPH W/CTA COR W/SCORE W/CA W/CM &/OR WO/CM   Basic metabolic panel   CBC   EKG 12-Lead   Meds ordered this encounter  Medications   metoprolol succinate (TOPROL XL) 25 MG 24 hr tablet    Sig: Take 1 tablet (25 mg total) by mouth daily.    Dispense:  90 tablet    Refill:  3    Dose DECREASE    Patient Instructions  Medication Instructions:  DECREASE Metoprolol (Toprol XlL to 42m once daily *If you need a refill on your cardiac medications before your next appointment, please call your pharmacy*   Lab Work: CBC, BMET Today If you have labs (blood work) drawn today and your tests are completely normal, you will receive your results  only by: [employer] (if you have MyChart) OR A paper copy in the mail If you have any lab test that is abnormal or we need to change your treatment, we will call you to review the results.   Testing/Procedures: Left Heart Cath Your physician has requested that you have a cardiac catheterization. Cardiac catheterization is used to diagnose and/or treat various heart conditions. Doctors may recommend this procedure for a number of different reasons. The most common reason is to evaluate chest pain. Chest pain can be a symptom  of coronary artery disease (CAD), and cardiac catheterization can show whether plaque is narrowing or blocking your heart's arteries. This procedure is also used to evaluate the valves, as well as measure the blood flow and oxygen levels in different parts of your heart. For further information please visit [facility]uge[facility]iest[facility][email or web address]. Please follow instruction sheet, as given.  Follow-Up: At [facility], you and your health needs are our priority.  As part of our continuing mission to provide you with exceptional heart care, we have created designated Provider Care Teams.  These Care Teams include your primary Cardiologist (physician) and Advanced Practice Providers (APPs -  Physician Assistants and Nurse Practitioners) who all work together to provide you with the care you need, when you need it.  Your next appointment:   Structual Team will follow-up  Provider:   Sherren Mocha, MD   Other Instructions  [place] OFFICE [place], [place] [address] Dept: [phone] Loc: [place]  01/09/2022  You are scheduled for a Cardiac Catheterization on Thursday, June 8 with Dr. Sherren Mocha.  1. Please arrive at the Main Entrance A at [facility]: [place], [place] [address] at 8:30 AM (This time is two hours before your procedure to ensure your preparation). Free valet parking service is available.   Special note: Every effort is made to have your procedure done on time. Please understand that emergencies sometimes delay scheduled procedures.  2. Diet: Do not eat solid foods after midnight.  You may have clear liquids until 5 AM upon the day of the procedure.  3. Labs: TODAY  4. Medication instructions in preparation for your procedure:   Contrast Allergy: No  HOLD ELIQUIS 2 days prior AND day of procedure (last dose Monday 01/20/22)  DO NOT take  Alfuzosin morning of procedure  On the morning of your procedure, take Aspirin 39m and any morning medicines NOT listed above.  You may use sips of water.  5. Plan to go home the same day, you will only stay overnight if medically necessary. 6. You MUST have a responsible adult to drive you home. 7. An adult MUST be with you the first 24 hours after you arrive home. 8. Bring a current list of your medications, and the last time and date medication taken. 9. Bring ID and current insurance cards. 10.Please wear clothes that are easy to get on and off and wear slip-on shoes.  Thank you for allowing [place] care for you!   -- [place] [facility]     Important Information About Sugar         Signed, MSherren Mocha MD  01/09/2022 4:55 PM    [facility]

## 2022-01-09 NOTE — Patient Instructions (Signed)
Medication Instructions:  DECREASE Metoprolol (Toprol XlL to '25mg'$  once daily *If you need a refill on your cardiac medications before your next appointment, please call your pharmacy*   Lab Work: CBC, BMET Today If you have labs (blood work) drawn today and your tests are completely normal, you will receive your results only by: [place] (if you have MyChart) OR A paper copy in the mail If you have any lab test that is abnormal or we need to change your treatment, we will call you to review the results.   Testing/Procedures: Left Heart Cath Your physician has requested that you have a cardiac catheterization. Cardiac catheterization is used to diagnose and/or treat various heart conditions. Doctors may recommend this procedure for a number of different reasons. The most common reason is to evaluate chest pain. Chest pain can be a symptom of coronary artery disease (CAD), and cardiac catheterization can show whether plaque is narrowing or blocking your heart's arteries. This procedure is also used to evaluate the valves, as well as measure the blood flow and oxygen levels in different parts of your heart. For further information please visit [employer]iest[employer]tn. Please follow instruction sheet, as given.  Follow-Up: At [facility], you and your health needs are our priority.  As part of our continuing mission to provide you with exceptional heart care, we have created designated Provider Care Teams.  These Care Teams include your primary Cardiologist (physician) and Advanced Practice Providers (APPs -  Physician Assistants and Nurse Practitioners) who all work together to provide you with the care you need, when you need it.  Your next appointment:   Structual Team will follow-up  Provider:   Sherren Mocha, MD   Other Instructions  [place] OFFICE [place], [place]  [address] Dept: [phone] Loc: [place]  01/09/2022  You are scheduled for a Cardiac Catheterization on Thursday, June 8 with Dr. Sherren Mocha.  1. Please arrive at the Main Entrance A at [facility]: [place], [place] [address] at 8:30 AM (This time is two hours before your procedure to ensure your preparation). Free valet parking service is available.   Special note: Every effort is made to have your procedure done on time. Please understand that emergencies sometimes delay scheduled procedures.  2. Diet: Do not eat solid foods after midnight.  You may have clear liquids until 5 AM upon the day of the procedure.  3. Labs: TODAY  4. Medication instructions in preparation for your procedure:   Contrast Allergy: No  HOLD ELIQUIS 2 days prior AND day of procedure (last dose Monday 01/20/22)  DO NOT take Alfuzosin morning of procedure  On the morning of your procedure, take Aspirin '81mg'$  and any morning medicines NOT listed above.  You may use sips of water.  5. Plan to go home the same day, you will only stay overnight if medically necessary. 6. You MUST have a responsible adult to drive you home. 7. An adult MUST be with you the first 24 hours after you arrive home. 8. Bring a current list of your medications, and the last time and date medication taken. 9. Bring ID and current insurance cards. 10.Please wear clothes that are easy to get on and off and wear slip-on shoes.  Thank you for allowing [place] to care for you!   -- [place] [facility]     Important  Information About Sugar

## 2022-01-09 NOTE — H&P (View-Only) (Signed)
Cardiology Office Note:    Date:  01/09/2022   ID:  Gerald Spears, DOB [birth year], MRN [id number]  PCP:  [name], MD   [facility] Providers Cardiologist:  Evalina Field, MD     Referring MD: [name], MD   Chief Complaint  Patient presents with   Aortic Stenosis    History of Present Illness:    Gerald Spears is a 81 y.o. male referred by Dr Audie Box for evaluation of aortic stenosis.   The patient underwent cardiac catheterization in January 2022 after presenting with exertional dyspnea and having an abnormal nuclear stress test with a reversible perfusion defect in the lateral apex. Cardiac cath demonstrated severe diagonal branch stenosis but no other obstructive epicardial CAD.  Medical therapy was recommended at that time.  He has had fairly rapid progression of aortic stenosis over the past few years.In 2020 the mean transaortic gradient was 16 mmHg with a dimensionless index of 0.34.  The mean transaortic gradient in 2022 and increased to 32 mmHg with a dimensionless index of 0.35.  His recent echo shows peak and mean gradients of 62 and 40 mmHg, respectively, with a dimensionless index of 0.17 and calculated valve area of 0.7 cm.  Last year, the patient underwent revision of the left knee replacement.  After surgery, he had COVID-pneumonia and also began having issues with paroxysmal atrial fibrillation.  He really has not done well since that time.  He remains functionally independent, but reports progressive symptoms of exertional dyspnea and fatigue.  He is now quite limited and he feels exhausted after playing golf or walking up a single flight of stairs.  He has to rest much more frequently.  He has lightheadedness with physical activity but he has not experienced frank syncope.  He describes exertional chest pressure as well as exertional dyspnea.  He does not have orthopnea or PND.  The patient is also developed mild cognitive dysfunction over the  past year and his wife reports that his memory has declined.  He has remained remarkably active and continues to do work in his yard and play golf.  [place] in [facility].  Again, he is much more fatigued with any physical activity.  Past Medical History:  Diagnosis Date   Arrhythmia    Cancer [facility])    colon polyp   Complication of anesthesia    difficult to wake up after surgery   Coronary artery disease    DJD (degenerative joint disease)    Dr. Noemi Chapel end stages of DJD 2011   Hard of hearing    Urinary frequency    UTI (lower urinary tract infection)     Past Surgical History:  Procedure Laterality Date   ANKLE SURGERY Right    COLONOSCOPY W/ BIOPSIES AND POLYPECTOMY     ELBOW SURGERY Right    EYE SURGERY     HERNIA REPAIR     JOINT REPLACEMENT     KNEE SURGERY Right    x4   LEFT HEART CATH AND CORONARY ANGIOGRAPHY N/A 09/12/2020   Procedure: LEFT HEART CATH AND CORONARY ANGIOGRAPHY;  Surgeon: Sherren Mocha, MD;  Location: [facility];  Service: Cardiovascular;  Laterality: N/A;   MULTIPLE TOOTH EXTRACTIONS     TOTAL KNEE ARTHROPLASTY Right    TOTAL KNEE ARTHROPLASTY Left 06/18/2015   Procedure: TOTAL KNEE ARTHROPLASTY;  Surgeon: Frederik Pear, MD;  Location: [facility];  Service: Orthopedics;  Laterality: Left;   TOTAL  KNEE REVISION Right 02/11/2021   Procedure: RIGHT TOTAL KNEE REVISION;  Surgeon: Frederik Pear, MD;  Location: [facility];  Service: Orthopedics;  Laterality: Right;    Current Medications: Current Meds  Medication Sig   alfuzosin (UROXATRAL) 10 MG 24 hr tablet Take 1 tablet (10 mg total) by mouth at bedtime.   amoxicillin (AMOXIL) 500 MG capsule Take 2,000 mg by mouth See admin instructions. Take 2000 mg 1 hour prior to dental work   apixaban (ELIQUIS) 5 MG TABS tablet TAKE 1 TABLET BY MOUTH TWICE A DAY   atorvastatin (LIPITOR) 10 MG tablet Take 1 tablet (10 mg total) by mouth daily. (Patient taking differently: Take 10 mg by  mouth 3 (three) times a week.)   Cholecalciferol (VITAMIN D3) 50 MCG (2000 UT) TABS Take by mouth daily. Patient taking 1,000 units   doxycycline (VIBRAMYCIN) 100 MG capsule Take 100 mg by mouth daily.   finasteride (PROSCAR) 5 MG tablet Take 1 tablet (5 mg total) by mouth daily at 12 noon.   metoprolol succinate (TOPROL XL) 25 MG 24 hr tablet Take 1 tablet (25 mg total) by mouth daily.   valACYclovir (VALTREX) 500 MG tablet Take 500 mg by mouth 2 (two) times daily as needed (Cold sore).    vitamin B-12 (CYANOCOBALAMIN) 1000 MCG tablet Take 1,000 mcg by mouth every other day.   [DISCONTINUED] metoprolol succinate (TOPROL XL) 25 MG 24 hr tablet Take 1 tablet (25 mg total) by mouth 2 (two) times daily.     Allergies:   Patient has no known allergies.   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  Tobacco Use   Smoking status: Never   Smokeless tobacco: Never  Vaping Use   Vaping Use: Never used  Substance and Sexual Activity   Alcohol use: No   Drug use: No   Sexual activity: Yes  Other Topics Concern   Not on file  Social History Narrative   Not on file   Social Determinants of Health   Financial Resource Strain: Not on file  Food Insecurity: Not on file  Transportation Needs: Not on file  Physical Activity: Not on file  Stress: Not on file  Social Connections: Not on file     Family History: The patient's family history includes Breast cancer in his sister and sister; Heart attack in his mother; Heart disease in his father.  ROS:   Please see the history of present illness.    All other systems reviewed and are negative.  EKGs/Labs/Other Studies Reviewed:    The following studies were reviewed today: Echo: 1. Left ventricular ejection fraction, by estimation, is  60 to 65%. The  left ventricle has normal function. The left ventricle has no regional  wall motion abnormalities. There is mild concentric left ventricular  hypertrophy. Left ventricular diastolic  parameters are consistent with Grade II diastolic dysfunction  (pseudonormalization). Elevated left ventricular end-diastolic pressure.   2. Right ventricular systolic function is normal. The right ventricular  size is normal. There is moderately elevated pulmonary artery systolic  pressure. The estimated right ventricular systolic pressure is 73.4 mmHg.   3. Left atrial size was severely dilated.   4. The mitral valve is degenerative. Moderate mitral valve regurgitation.  No evidence of  mitral stenosis.   5. Tricuspid valve regurgitation is mild to moderate.   6. The aortic valve is calcified. There is severe calcifcation of the  aortic valve. There is moderate thickening of the aortic valve. Aortic  valve regurgitation is not visualized. Severe aortic valve stenosis.  Aortic valve mean gradient measures 40.0  mmHg. Aortic valve Vmax measures 3.92 m/s.   7. Aortic dilatation noted. There is mild dilatation of the ascending  aorta, measuring 38 mm.   8. The inferior vena cava is normal in size with greater than 50%  respiratory variability, suggesting right atrial pressure of 3 mmHg.   Comparison(s): EF 55%, asc aor 16m, mod AS mean 359mg, peak 46.8m51m,  mild MR, mild MAC, RVSP 38.5mm33m Compared to study dated 03/14/2021 the  DVI has decreased from 0.35 to 0.17 and AVA has decreased from 1.59cm2 to  0.7cm2 AS is now severe.   LEFT VENTRICLE  PLAX 2D  LVIDd:         5.74 cm      Diastology  LVIDs:         4.10 cm      LV e' medial:    5.43 cm/s  LV PW:         1.28 cm      LV E/e' medial:  22.1  LV IVS:        1.20 cm      LV e' lateral:   4.31 cm/s  LVOT diam:     2.30 cm      LV E/e' lateral: 27.8  LV SV:         72  LV SV Index:   35  LVOT Area:     4.15 cm     LV Volumes  (MOD)  LV vol d, MOD A2C: 103.0 ml  LV vol d, MOD A4C: 89.5 ml  LV vol s, MOD A2C: 41.6 ml  LV vol s, MOD A4C: 31.4 ml  LV SV MOD A2C:     61.4 ml  LV SV MOD A4C:     89.5 ml  LV SV MOD BP:      63.3 ml   RIGHT VENTRICLE  RV S prime:     17.60 cm/s  TAPSE (M-mode): 2.8 cm   LEFT ATRIUM              Index        RIGHT ATRIUM           Index  LA diam:        4.90 cm  2.40 cm/m   RA Area:     17.90 cm  LA Vol (A2C):   134.0 ml 65.72 ml/m  RA Volume:   44.60 ml  21.87 ml/m  LA Vol (A4C):   95.1 ml  46.64 ml/m  LA Biplane Vol: 115.0 ml 56.40 ml/m   AORTIC VALVE                     PULMONIC VALVE  AV Area (Vmax):    0.75 cm      PV Vmax:          1.04 m/s  AV Area (Vmean):   0.62 cm      PV Peak grad:     4.3 mmHg  AV Area (VTI):     0.70 cm      PR End Diast Vel: 7.29 msec  AV Vmax:  392.00 cm/s  AV Vmean:          306.000 cm/s  AV VTI:            1.026 m  AV Peak Grad:      61.5 mmHg  AV Mean Grad:      40.0 mmHg  LVOT Vmax:         70.60 cm/s  LVOT Vmean:        45.500 cm/s  LVOT VTI:          0.174 m  LVOT/AV VTI ratio: 0.17     AORTA  Ao Root diam: 3.50 cm  Ao Asc diam:  3.90 cm  Ao Arch diam: 3.4 cm   MITRAL VALVE                TRICUSPID VALVE  MV Area (PHT): 7.66 cm     TR Peak grad:   45.2 mmHg  MV Decel Time: 99 msec      TR Vmax:        336.00 cm/s  MR PISA:        3.08 cm  MR PISA Radius: 0.70 cm     SHUNTS  MV E velocity: 120.00 cm/s  Systemic VTI:  0.17 m  MV A velocity: 96.20 cm/s   Systemic Diam: 2.30 cm  MV E/A ratio:  1.25   Cardiac Cath 09/12/2020: 1. Left dominant coronary anatomy 2. Patent left main 3. Patent LAD with no significant stenosis, but severe second diagonal branch stenosis 4. Moderate OM stenosis, no tight stenosis in LCx distribution 5. Patent, nondominant RCA 6. Normal LVEDP   Recommend: medical therapy, branch vessel CAD in patient without angina. Low risk nuclear stress test but nuclear defect fits coronary  stenosis in second diagonal. PCI technically feasible if patient fails med Rx.  EKG:  EKG is ordered today.  The ekg ordered today demonstrates marked sinus bradycardia 46 bpm with premature supraventricular beats, moderate voltage criteria for LVH may be normal variant.  Recent Labs: 01/09/2022: BUN 22; Creatinine, Ser 1.03; Hemoglobin 13.1; Platelets 140; Potassium 5.4; Sodium 137  Recent Lipid Panel No results found for: CHOL, TRIG, HDL, CHOLHDL, VLDL, LDLCALC, LDLDIRECT   Risk Assessment/Calculations:    CHA2DS2-VASc Score = 4   This indicates a 4.8% annual risk of stroke. The patient's score is based upon: CHF History: 0 HTN History: 1 Diabetes History: 0 Stroke History: 0 Vascular Disease History: 1 Age Score: 2 Gender Score: 0          Physical Exam:    VS:  BP 110/80   Pulse (!) 46   Ht 6' (1.829 m)   Wt 185 lb 12.8 oz (84.3 kg)   SpO2 98%   BMI 25.20 kg/m     Wt Readings from Last 3 Encounters:  01/09/22 185 lb 12.8 oz (84.3 kg)  12/06/21 184 lb 12.8 oz (83.8 kg)  09/09/21 180 lb 3.2 oz (81.7 kg)     GEN:  Well nourished, well developed pleasant elderly male in no acute distress HEENT: Normal NECK: No JVD; No carotid bruits LYMPHATICS: No lymphadenopathy CARDIAC: RRR, 3/6 harsh systolic murmur at the right upper sternal border, late peaking with absent A2 RESPIRATORY:  Clear to auscultation without rales, wheezing or rhonchi  ABDOMEN: Soft, non-tender, non-distended MUSCULOSKELETAL:  No edema; No deformity  SKIN: Warm and dry NEUROLOGIC:  Alert and oriented x 3 PSYCHIATRIC:  Normal affect   ASSESSMENT:    1. Nonrheumatic aortic valve stenosis  2. Preprocedural cardiovascular examination   3. Coronary artery disease involving native coronary artery of native heart with angina pectoris ([place])    PLAN:    In order of problems listed above:  The patient has severe, stage D1 aortic stenosis with progressive New York Heart Association functional  class III symptoms of exertional dyspnea, exertional chest pressure, and fatigue.  I have personally reviewed his echo study which demonstrates severe calcification and restriction of the aortic valve leaflets with Doppler findings consistent with severe aortic stenosis.  The patient's mean transaortic gradient is 40 mmHg with a dimensionless index less than 0.25 and a calculated aortic valve area of 0.7 cm.  I reviewed the natural history of symptomatic aortic stenosis with the patient and his wife today.  We discussed specific treatment options in the context of his known medical comorbidities.  Treatment options include conventional aortic valve replacement, TAVR, or palliative medical therapy.  Considering the patient's age, mild cognitive impairment, and otherwise functional independence, he appears to be a good candidate for TAVR pending further evaluation of his coronary artery disease and vascular anatomy.  I have recommended repeat cardiac catheterization since his angina has progressed and he has known obstructive disease of the diagonal branch of the LAD. I have reviewed the risks, indications, and alternatives to cardiac catheterization, possible angioplasty, and stenting with the patient. Risks include but are not limited to bleeding, infection, vascular injury, stroke, myocardial infection, arrhythmia, kidney injury, radiation-related injury in the case of prolonged fluoroscopy use, emergency cardiac surgery, and death. The patient understands the risks of serious complication is 1-2 in 3536 with diagnostic cardiac cath and 1-2% or less with angioplasty/stenting.  The patient will also undergo a gated cardiac CTA (TAVR protocol) and a CTA of the chest, abdomen, and pelvis to define his vascular access and aortic valve anatomy.  Once his cardiac catheterization and CTA studies are completed, he will be referred to Dr. Cyndia Bent for formal cardiac surgical consultation as part of a multidisciplinary  approach to his care.      Shared Decision Making/Informed Consent The risks [stroke (1 in 1000), death (1 in 1000), kidney failure [usually temporary] (1 in 500), bleeding (1 in 200), allergic reaction [possibly serious] (1 in 200)], benefits (diagnostic support and management of coronary artery disease) and alternatives of a cardiac catheterization were discussed in detail with Mr. Dancel and he is willing to proceed.    Medication Adjustments/Labs and Tests Ordered: Current medicines are reviewed at length with the patient today.  Concerns regarding medicines are outlined above.  Orders Placed This Encounter  Procedures   CT ANGIO CHEST AORTA W/CM & OR WO/CM   CT ANGIO ABDOMEN PELVIS  W &/OR WO CONTRAST   CT CORONARY MORPH W/CTA COR W/SCORE W/CA W/CM &/OR WO/CM   Basic metabolic panel   CBC   EKG 12-Lead   Meds ordered this encounter  Medications   metoprolol succinate (TOPROL XL) 25 MG 24 hr tablet    Sig: Take 1 tablet (25 mg total) by mouth daily.    Dispense:  90 tablet    Refill:  3    Dose DECREASE    Patient Instructions  Medication Instructions:  DECREASE Metoprolol (Toprol XlL to 42m once daily *If you need a refill on your cardiac medications before your next appointment, please call your pharmacy*   Lab Work: CBC, BMET Today If you have labs (blood work) drawn today and your tests are completely normal, you will receive your results  only by: [employer] (if you have MyChart) OR A paper copy in the mail If you have any lab test that is abnormal or we need to change your treatment, we will call you to review the results.   Testing/Procedures: Left Heart Cath Your physician has requested that you have a cardiac catheterization. Cardiac catheterization is used to diagnose and/or treat various heart conditions. Doctors may recommend this procedure for a number of different reasons. The most common reason is to evaluate chest pain. Chest pain can be a symptom  of coronary artery disease (CAD), and cardiac catheterization can show whether plaque is narrowing or blocking your heart's arteries. This procedure is also used to evaluate the valves, as well as measure the blood flow and oxygen levels in different parts of your heart. For further information please visit [email or web address]. Please follow instruction sheet, as given.  Follow-Up: At [facility], you and your health needs are our priority.  As part of our continuing mission to provide you with exceptional heart care, we have created designated Provider Care Teams.  These Care Teams include your primary Cardiologist (physician) and Advanced Practice Providers (APPs -  Physician Assistants and Nurse Practitioners) who all work together to provide you with the care you need, when you need it.  Your next appointment:   Structual Team will follow-up  Provider:   Sherren Mocha, MD   Other Instructions  [facility] OFFICE [place], [place] [address] Dept: [phone] Loc: [place]  01/09/2022  You are scheduled for a Cardiac Catheterization on Thursday, June 8 with Dr. Sherren Mocha.  1. Please arrive at the Main Entrance A at [facility]: [place], [place] [address] at 8:30 AM (This time is two hours before your procedure to ensure your preparation). Free valet parking service is available.   Special note: Every effort is made to have your procedure done on time. Please understand that emergencies sometimes delay scheduled procedures.  2. Diet: Do not eat solid foods after midnight.  You may have clear liquids until 5 AM upon the day of the procedure.  3. Labs: TODAY  4. Medication instructions in preparation for your procedure:   Contrast Allergy: No  HOLD ELIQUIS 2 days prior AND day of procedure (last dose Monday 01/20/22)  DO NOT take  Alfuzosin morning of procedure  On the morning of your procedure, take Aspirin 37m and any morning medicines NOT listed above.  You may use sips of water.  5. Plan to go home the same day, you will only stay overnight if medically necessary. 6. You MUST have a responsible adult to drive you home. 7. An adult MUST be with you the first 24 hours after you arrive home. 8. Bring a current list of your medications, and the last time and date medication taken. 9. Bring ID and current insurance cards. 10.Please wear clothes that are easy to get on and off and wear slip-on shoes.  Thank you for allowing [facility] for you!   -- [facility]     Important Information About Sugar         Signed, MSherren Mocha MD  01/09/2022 4:55 PM    [facility]

## 2022-01-09 NOTE — Progress Notes (Addendum)
Pre Surgical Assessment: 5 M Walk Test  48M=16.60f  5 Meter Walk Test- trial 1: 4.46 seconds 5 Meter Walk Test- trial 2: 4.32 seconds 5 Meter Walk Test- trial 3: 4.78 seconds 5 Meter Walk Test Average: 4.52 seconds  STS risk calculation: Isolated AVR Risk of Mortality: 1.404% Renal Failure: 1.501% Permanent Stroke: 1.396% Prolonged Ventilation: 3.746% DSW Infection: 0.055% Reoperation: 4.372% Morbidity or Mortality: 7.880% Short Length of Stay: 44.832% Long Length of Stay: 3.191%

## 2022-01-21 ENCOUNTER — Telehealth: Payer: Self-pay | Admitting: *Deleted

## 2022-01-21 NOTE — Telephone Encounter (Addendum)
Cardiac Catheterization scheduled at [facility] for: Thursday January 23, 2022 10:30 AM Arrival time and place: [place] Entrance A at: 8:30 AM   Nothing to eat after midnight prior to procedure, clear liquids until 5 AM day of procedure.  Medication instructions: -Hold:  Eliquis-none 01/21/22 until post procedure -Except hold medications usual morning medications can be taken with sips of water including aspirin 81 mg.  Confirmed patient has responsible adult to drive home post procedure and be with patient first 24 hours after arriving home.  Patient reports no new symptoms concerning for COVID-19/no exposure to COVID-19 in the past 10 days.  Reviewed procedure instructions with patient's wife at patient's request.

## 2022-01-23 ENCOUNTER — Ambulatory Visit (HOSPITAL_COMMUNITY)
Admission: RE | Admit: 2022-01-23 | Discharge: 2022-01-23 | Disposition: A | Discharge status: Home / Self Care | Payer: Medicare Other | Attending: Cardiovascular Disease | Admitting: Cardiovascular Disease

## 2022-01-23 ENCOUNTER — Encounter (HOSPITAL_COMMUNITY)
Admission: RE | Disposition: A | Discharge status: Home / Self Care | Payer: Self-pay | Source: Home / Self Care | Attending: Cardiovascular Disease

## 2022-01-23 ENCOUNTER — Other Ambulatory Visit: Payer: Self-pay

## 2022-01-23 DIAGNOSIS — I2510 Atherosclerotic heart disease of native coronary artery without angina pectoris: Secondary | ICD-10-CM | POA: Diagnosis not present

## 2022-01-23 DIAGNOSIS — I350 Nonrheumatic aortic (valve) stenosis: Secondary | ICD-10-CM | POA: Diagnosis not present

## 2022-01-23 HISTORY — PX: RIGHT/LEFT HEART CATH AND CORONARY ANGIOGRAPHY: CATH118266

## 2022-01-23 LAB — POCT I-STAT 7, (LYTES, BLD GAS, ICA,H+H)
Acid-base deficit: 1 mmol/L (ref 0.0–2.0)
Bicarbonate: 24.8 mmol/L (ref 20.0–28.0)
Calcium, Ion: 1.3 mmol/L (ref 1.15–1.40)
HCT: 34 % — ABNORMAL LOW (ref 39.0–52.0)
Hemoglobin: 11.6 g/dL — ABNORMAL LOW (ref 13.0–17.0)
O2 Saturation: 93 %
Potassium: 4.1 mmol/L (ref 3.5–5.1)
Sodium: 140 mmol/L (ref 135–145)
TCO2: 26 mmol/L (ref 22–32)
pCO2 arterial: 45.3 mmHg (ref 32–48)
pH, Arterial: 7.347 — ABNORMAL LOW (ref 7.35–7.45)
pO2, Arterial: 70 mmHg — ABNORMAL LOW (ref 83–108)

## 2022-01-23 LAB — POCT I-STAT EG7
Acid-base deficit: 2 mmol/L (ref 0.0–2.0)
Bicarbonate: 24 mmol/L (ref 20.0–28.0)
Calcium, Ion: 1.31 mmol/L (ref 1.15–1.40)
HCT: 35 % — ABNORMAL LOW (ref 39.0–52.0)
Hemoglobin: 11.9 g/dL — ABNORMAL LOW (ref 13.0–17.0)
O2 Saturation: 68 %
Potassium: 4 mmol/L (ref 3.5–5.1)
Sodium: 140 mmol/L (ref 135–145)
TCO2: 25 mmol/L (ref 22–32)
pCO2, Ven: 45.6 mmHg (ref 44–60)
pH, Ven: 7.33 (ref 7.25–7.43)
pO2, Ven: 38 mmHg (ref 32–45)

## 2022-01-23 SURGERY — RIGHT/LEFT HEART CATH AND CORONARY ANGIOGRAPHY
Anesthesia: LOCAL

## 2022-01-23 MED ORDER — SODIUM CHLORIDE 0.9 % WEIGHT BASED INFUSION
3.0000 mL/kg/h | INTRAVENOUS | Status: AC
  Administered 2022-01-23: 3 mL/kg/h via INTRAVENOUS

## 2022-01-23 MED ORDER — SODIUM CHLORIDE 0.9 % IV SOLN: 250.0000 mL | INTRAVENOUS | Status: DC | PRN

## 2022-01-23 MED ORDER — ACETAMINOPHEN 325 MG PO TABS: 650.0000 mg | ORAL_TABLET | ORAL | Status: DC | PRN

## 2022-01-23 MED ORDER — LIDOCAINE HCL (PF) 1 % IJ SOLN
INTRAMUSCULAR | Status: AC
  Filled 2022-01-23: qty 30

## 2022-01-23 MED ORDER — HYDRALAZINE HCL 20 MG/ML IJ SOLN: 10.0000 mg | INTRAMUSCULAR | Status: DC | PRN

## 2022-01-23 MED ORDER — LIDOCAINE HCL (PF) 1 % IJ SOLN
INTRAMUSCULAR | Status: DC | PRN
  Administered 2022-01-23 (×2): 2 mL

## 2022-01-23 MED ORDER — ASPIRIN 81 MG PO CHEW: 81.0000 mg | CHEWABLE_TABLET | ORAL | Status: DC

## 2022-01-23 MED ORDER — SODIUM CHLORIDE 0.9% FLUSH
3.0000 mL | INTRAVENOUS | Status: DC | PRN
Start: 2022-01-23 — End: 2022-01-23

## 2022-01-23 MED ORDER — MIDAZOLAM HCL 2 MG/2ML IJ SOLN
INTRAMUSCULAR | Status: AC
  Filled 2022-01-23: qty 2

## 2022-01-23 MED ORDER — FENTANYL CITRATE (PF) 100 MCG/2ML IJ SOLN
INTRAMUSCULAR | Status: AC
  Filled 2022-01-23: qty 2

## 2022-01-23 MED ORDER — SODIUM CHLORIDE 0.9% FLUSH: 3.0000 mL | Freq: Two times a day (BID) | INTRAVENOUS | Status: DC

## 2022-01-23 MED ORDER — HEPARIN (PORCINE) IN NACL 1000-0.9 UT/500ML-% IV SOLN
INTRAVENOUS | Status: AC
  Filled 2022-01-23: qty 1000

## 2022-01-23 MED ORDER — HEPARIN SODIUM (PORCINE) 1000 UNIT/ML IJ SOLN
INTRAMUSCULAR | Status: AC
  Filled 2022-01-23: qty 10

## 2022-01-23 MED ORDER — ONDANSETRON HCL 4 MG/2ML IJ SOLN: 4.0000 mg | Freq: Four times a day (QID) | INTRAMUSCULAR | Status: DC | PRN

## 2022-01-23 MED ORDER — SODIUM CHLORIDE 0.9 % WEIGHT BASED INFUSION: 1.0000 mL/kg/h | INTRAVENOUS | Status: DC

## 2022-01-23 MED ORDER — HEPARIN SODIUM (PORCINE) 1000 UNIT/ML IJ SOLN
INTRAMUSCULAR | Status: DC | PRN
  Administered 2022-01-23: 4000 [IU] via INTRAVENOUS

## 2022-01-23 MED ORDER — LABETALOL HCL 5 MG/ML IV SOLN: 10.0000 mg | INTRAVENOUS | Status: DC | PRN

## 2022-01-23 MED ORDER — SODIUM CHLORIDE 0.9 % WEIGHT BASED INFUSION
1.0000 mL/kg/h | INTRAVENOUS | Status: DC
Start: 2022-01-23 — End: 2022-01-23

## 2022-01-23 MED ORDER — VERAPAMIL HCL 2.5 MG/ML IV SOLN
INTRAVENOUS | Status: AC
  Filled 2022-01-23: qty 2

## 2022-01-23 MED ORDER — HEPARIN (PORCINE) IN NACL 1000-0.9 UT/500ML-% IV SOLN
INTRAVENOUS | Status: DC | PRN
  Administered 2022-01-23 (×2): 500 mL

## 2022-01-23 MED ORDER — MIDAZOLAM HCL 2 MG/2ML IJ SOLN
INTRAMUSCULAR | Status: DC | PRN
  Administered 2022-01-23: 2 mg via INTRAVENOUS

## 2022-01-23 MED ORDER — IOHEXOL 350 MG/ML SOLN
INTRAVENOUS | Status: DC | PRN
  Administered 2022-01-23: 55 mL

## 2022-01-23 MED ORDER — FENTANYL CITRATE (PF) 100 MCG/2ML IJ SOLN
INTRAMUSCULAR | Status: DC | PRN
Start: 2022-01-23 — End: 2022-01-23
  Administered 2022-01-23: 25 ug via INTRAVENOUS

## 2022-01-23 MED ORDER — VERAPAMIL HCL 2.5 MG/ML IV SOLN
INTRAVENOUS | Status: DC | PRN
  Administered 2022-01-23: 10 mL via INTRA_ARTERIAL

## 2022-01-23 MED ORDER — SODIUM CHLORIDE 0.9% FLUSH: 3.0000 mL | INTRAVENOUS | Status: DC | PRN

## 2022-01-23 SURGICAL SUPPLY — 15 items
BAND CMPR LRG ZPHR (HEMOSTASIS) ×1
BAND ZEPHYR COMPRESS 30 LONG (HEMOSTASIS) ×1 IMPLANT
CATH 5FR JL3.5 JR4 ANG PIG MP (CATHETERS) ×1 IMPLANT
CATH BALLN WEDGE 5F 110CM (CATHETERS) ×1 IMPLANT
CATH INFINITI 5FR AL1 (CATHETERS) ×1 IMPLANT
GLIDESHEATH SLEND SS 6F .021 (SHEATH) ×1 IMPLANT
GUIDEWIRE .025 260CM (WIRE) ×1 IMPLANT
GUIDEWIRE INQWIRE 1.5J.035X260 (WIRE) IMPLANT
INQWIRE 1.5J .035X260CM (WIRE) ×2
KIT HEART LEFT (KITS) ×3 IMPLANT
PACK CARDIAC CATHETERIZATION (CUSTOM PROCEDURE TRAY) ×3 IMPLANT
SHEATH GLIDE SLENDER 4/5FR (SHEATH) ×1 IMPLANT
TRANSDUCER W/STOPCOCK (MISCELLANEOUS) ×3 IMPLANT
TUBING CIL FLEX 10 FLL-RA (TUBING) ×3 IMPLANT
WIRE EMERALD ST .035X260CM (WIRE) ×1 IMPLANT

## 2022-01-23 NOTE — Discharge Instructions (Signed)

## 2022-01-23 NOTE — Interval H&P Note (Signed)
Cath Lab Visit (complete for each Cath Lab visit)  Clinical Evaluation Leading to the Procedure:   ACS: No.  Non-ACS:    Anginal Classification: CCS III  Anti-ischemic medical therapy: Maximal Therapy (2 or more classes of medications)  Non-Invasive Test Results: No non-invasive testing performed  Prior CABG: No previous CABG      History and Physical Interval Note:  01/23/2022 8:51 AM  Gerald Spears  has presented today for surgery, with the diagnosis of Aortic Stenosis.  The various methods of treatment have been discussed with the patient and family. After consideration of risks, benefits and other options for treatment, the patient has consented to  Procedure(s): RIGHT/LEFT HEART CATH AND CORONARY ANGIOGRAPHY (N/A) as a surgical intervention.  The patient's history has been reviewed, patient examined, no change in status, stable for surgery.  I have reviewed the patient's chart and labs.  Questions were answered to the patient's satisfaction.     Sherren Mocha

## 2022-01-24 ENCOUNTER — Encounter (HOSPITAL_COMMUNITY): Payer: Self-pay | Admitting: Cardiovascular Disease

## 2022-02-06 ENCOUNTER — Ambulatory Visit (HOSPITAL_COMMUNITY)
Admission: RE | Admit: 2022-02-06 | Discharge: 2022-02-06 | Disposition: A | Discharge status: Home / Self Care | Payer: Medicare Other | Source: Ambulatory Visit | Attending: Cardiovascular Disease | Admitting: Cardiovascular Disease

## 2022-02-06 DIAGNOSIS — Z01818 Encounter for other preprocedural examination: Secondary | ICD-10-CM | POA: Diagnosis not present

## 2022-02-06 DIAGNOSIS — K449 Diaphragmatic hernia without obstruction or gangrene: Secondary | ICD-10-CM | POA: Diagnosis not present

## 2022-02-06 DIAGNOSIS — I350 Nonrheumatic aortic (valve) stenosis: Secondary | ICD-10-CM | POA: Diagnosis not present

## 2022-02-06 DIAGNOSIS — I700 Atherosclerosis of aorta: Secondary | ICD-10-CM | POA: Diagnosis not present

## 2022-02-06 MED ORDER — IOHEXOL 350 MG/ML SOLN
100.0000 mL | Freq: Once | INTRAVENOUS | Status: AC | PRN
  Administered 2022-02-06: 100 mL via INTRAVENOUS

## 2022-02-10 ENCOUNTER — Encounter: Payer: Self-pay | Admitting: Physician Assistant

## 2022-02-12 ENCOUNTER — Institutional Professional Consult (permissible substitution) (INDEPENDENT_AMBULATORY_CARE_PROVIDER_SITE_OTHER): Payer: Medicare Other | Admitting: Surgery

## 2022-02-12 ENCOUNTER — Encounter: Payer: Self-pay | Admitting: Surgery

## 2022-02-12 VITALS — BP 111/58 | HR 76 | Resp 20 | Ht 71.0 in | Wt 180.0 lb

## 2022-02-12 DIAGNOSIS — I350 Nonrheumatic aortic (valve) stenosis: Secondary | ICD-10-CM | POA: Diagnosis not present

## 2022-02-12 DIAGNOSIS — I25119 Atherosclerotic heart disease of native coronary artery with unspecified angina pectoris: Secondary | ICD-10-CM

## 2022-02-14 ENCOUNTER — Other Ambulatory Visit: Payer: Self-pay

## 2022-02-14 DIAGNOSIS — I350 Nonrheumatic aortic (valve) stenosis: Secondary | ICD-10-CM

## 2022-02-14 NOTE — Progress Notes (Signed)
Patient ID: Gerald Spears, male   DOB: 1941-03-04, 81 y.o.   MRN: [id number]  [facility]VE [facility]INIC         [address]       [place] [address]             [phone]          CARDIOTHORACIC SURGERY CONSULTATION REPORT  PCP is [name], MD Referring Provider is Sherren Mocha, MD Primary Cardiologist is Evalina Field, MD  Reason for consultation: Severe aortic stenosis  HPI:  The patient is an 81 year old gentleman with a history of paroxysmal atrial fibrillation, degenerative joint disease status post bilateral knee replacements with revision of his left knee replacement last year, coronary artery disease and aortic stenosis.  He underwent catheterization in January 2022 after presenting with exertional shortness of breath and having an abnormal nuclear stress test with a reversible perfusion defect in the lateral apex.  Catheterization showed a high-grade diagonal stenosis but no other obstructive disease.  He was continued on medical therapy.  An echocardiogram in 2020 showed a mean gradient across aortic valve of 16 mmHg.  In 2022 that it increased to 32 mmHg.  His most recent echo on 12/23/2021 shows an increase in the mean gradient to 40 mmHg with a peak gradient of 61.5 mmHg.  Valve area by VTI 0.70 cm.  Left ventricular ejection fraction is 60 to 65% with mild concentric LVH and grade 2 diastolic dysfunction.  The patient is here today with his wife.  [place] and enjoy [place].  He had his left knee replacement revised last year and then developed COVID-pneumonia and paroxysmal atrial fibrillation after that and has not completely recovered.  He has developed progressive exertional shortness of breath and fatigue and reports that he has only been able to play less than 9 holes of golf without being exhausted.  He gets short of breath walking up a single flight of  stairs.  He has had lightheadedness with standing up and physical activity but denies syncope.  His wife has noted some mild cognitive dysfunction over the past year with memory loss.  Past Medical History:  Diagnosis Date   Complication of anesthesia    difficult to wake up after surgery   Coronary artery disease    DJD (degenerative joint disease)    Dr. Noemi Chapel end stages of DJD 2011   Hard of hearing    PAF (paroxysmal atrial fibrillation) ([facility])    Severe aortic stenosis    Urinary frequency    UTI (lower urinary tract infection)     Past Surgical History:  Procedure Laterality Date   ANKLE SURGERY Right    COLONOSCOPY W/ BIOPSIES AND POLYPECTOMY     ELBOW SURGERY Right    EYE SURGERY     HERNIA REPAIR     JOINT REPLACEMENT     KNEE SURGERY Right    x4   LEFT HEART CATH AND CORONARY ANGIOGRAPHY N/A 09/12/2020   Procedure: LEFT HEART CATH AND CORONARY ANGIOGRAPHY;  Surgeon: Sherren Mocha, MD;  Location: [facility];  Service: Cardiovascular;  Laterality: N/A;   MULTIPLE TOOTH EXTRACTIONS     RIGHT/LEFT HEART CATH AND CORONARY ANGIOGRAPHY N/A 01/23/2022   Procedure: RIGHT/LEFT HEART CATH AND CORONARY ANGIOGRAPHY;  Surgeon: Sherren Mocha, MD;  Location: [facility];  Service: Cardiovascular;  Laterality: N/A;   TOTAL KNEE ARTHROPLASTY Right  TOTAL KNEE ARTHROPLASTY Left 06/18/2015   Procedure: TOTAL KNEE ARTHROPLASTY;  Surgeon: Frederik Pear, MD;  Location: [facility];  Service: Orthopedics;  Laterality: Left;   TOTAL KNEE REVISION Right 02/11/2021   Procedure: RIGHT TOTAL KNEE REVISION;  Surgeon: Frederik Pear, MD;  Location: [facility];  Service: Orthopedics;  Laterality: Right;    Family History  Problem Relation Age of Onset   Heart disease Father    Heart attack Mother        age 66   Breast cancer Sister    Breast cancer Sister     Social History   Socioeconomic History   Marital status: Married    Spouse name: Not on file   Number of children: 4    Years of education: Not on file   Highest education level: Not on file  Occupational History   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  Tobacco Use   Smoking status: Never   Smokeless tobacco: Never  Vaping Use   Vaping Use: Never used  Substance and Sexual Activity   Alcohol use: No   Drug use: No   Sexual activity: Yes  Other Topics Concern   Not on file  Social History Narrative   Not on file   Social Determinants of Health   Financial Resource Strain: Not on file  Food Insecurity: Not on file  Transportation Needs: Not on file  Physical Activity: Not on file  Stress: Not on file  Social Connections: Not on file  Intimate Partner Violence: Not on file    Prior to Admission medications   Medication Sig Start Date End Date Taking? Authorizing Provider  acetaminophen (TYLENOL) 650 MG CR tablet Take 650 mg by mouth every 8 (eight) hours as needed for pain.   Yes [provider]  alfuzosin (UROXATRAL) 10 MG 24 hr tablet Take 1 tablet (10 mg total) by mouth at bedtime. 05/13/21  Yes McKenzie, Candee Furbish, MD  amoxicillin (AMOXIL) 500 MG capsule Take 2,000 mg by mouth See admin instructions. Take 2000 mg 1 hour prior to dental work   Yes [provider]  apixaban (ELIQUIS) 5 MG TABS tablet TAKE 1 TABLET BY MOUTH TWICE A DAY 06/12/21  Yes O'Neal, Cassie Freer, MD  atorvastatin (LIPITOR) 10 MG tablet Take 1 tablet (10 mg total) by mouth daily. Patient taking differently: Take 10 mg by mouth every Monday, Wednesday, and Friday. 10/15/20 02/12/22 Yes O'Neal, Cassie Freer, MD  cholecalciferol (VITAMIN D3) 25 MCG (1000 UNIT) tablet Take 1,000 Units by mouth daily.   Yes [provider]  doxycycline (VIBRA-TABS) 100 MG tablet Take 100 mg by mouth daily. 12/10/21  Yes [provider]  finasteride (PROSCAR) 5 MG tablet Take 1 tablet (5 mg total) by mouth daily at 12 noon. 05/13/21  Yes McKenzie, Candee Furbish, MD  metoprolol succinate (TOPROL XL) 25 MG 24 hr tablet Take 1  tablet (25 mg total) by mouth daily. 01/09/22  Yes Sherren Mocha, MD  Omega-3 Fatty Acids (FISH OIL) 1000 MG CAPS Take 1,000 mg by mouth daily.   Yes [provider]  valACYclovir (VALTREX) 500 MG tablet Take 500 mg by mouth 2 (two) times daily as needed (Cold sore).  05/15/15  Yes [provider]  vitamin B-12 (CYANOCOBALAMIN) 1000 MCG tablet Take 1,000 mcg by mouth daily.   Yes [provider]    Current Outpatient Medications  Medication Sig Dispense Refill   acetaminophen (TYLENOL) 650 MG CR tablet Take 650 mg by mouth every 8 (eight) hours  as needed for pain.     alfuzosin (UROXATRAL) 10 MG 24 hr tablet Take 1 tablet (10 mg total) by mouth at bedtime. 30 tablet 11   amoxicillin (AMOXIL) 500 MG capsule Take 2,000 mg by mouth See admin instructions. Take 2000 mg 1 hour prior to dental work     apixaban (ELIQUIS) 5 MG TABS tablet TAKE 1 TABLET BY MOUTH TWICE A DAY 180 tablet 1   atorvastatin (LIPITOR) 10 MG tablet Take 1 tablet (10 mg total) by mouth daily. (Patient taking differently: Take 10 mg by mouth every Monday, Wednesday, and Friday.) 90 tablet 3   cholecalciferol (VITAMIN D3) 25 MCG (1000 UNIT) tablet Take 1,000 Units by mouth daily.     doxycycline (VIBRA-TABS) 100 MG tablet Take 100 mg by mouth daily.     finasteride (PROSCAR) 5 MG tablet Take 1 tablet (5 mg total) by mouth daily at 12 noon. 90 tablet 3   metoprolol succinate (TOPROL XL) 25 MG 24 hr tablet Take 1 tablet (25 mg total) by mouth daily. 90 tablet 3   Omega-3 Fatty Acids (FISH OIL) 1000 MG CAPS Take 1,000 mg by mouth daily.     valACYclovir (VALTREX) 500 MG tablet Take 500 mg by mouth 2 (two) times daily as needed (Cold sore).   2   vitamin B-12 (CYANOCOBALAMIN) 1000 MCG tablet Take 1,000 mcg by mouth daily.     No current facility-administered medications for this visit.    No Known Allergies    Review of Systems:   General:  normal appetite, + decreased energy, no weight gain, no  weight loss, no fever  Cardiac:  no chest pain with exertion, no chest pain at rest, +SOB with mild exertion, + resting SOB, no PND, no orthopnea, no palpitations, + arrhythmia, + atrial fibrillation, + LE edema, + dizzy spells, no syncope  Respiratory:  + shortness of breath, no home oxygen, no productive cough, no dry cough, no bronchitis, no wheezing, no hemoptysis, no asthma, no pain with inspiration or cough, no sleep apnea, no CPAP at night  GI:   no difficulty swallowing, no reflux, no frequent heartburn, + hiatal hernia, no abdominal pain, no constipation, no diarrhea, no hematochezia, no hematemesis, no melena  GU:   no dysuria,  + frequency, no urinary tract infection, no hematuria, + enlarged prostate, no kidney stones, no kidney disease  Vascular:  no pain suggestive of claudication, no pain in feet, no leg cramps, no varicose veins, no DVT, no non-healing foot ulcer  Neuro:   no stroke, no TIA's, no seizures, no headaches, no temporary blindness one eye,  no slurred speech, no peripheral neuropathy, no chronic pain, no instability of gait, + memory/cognitive dysfunction  Musculoskeletal: + arthritis - primarily involving the knees, no joint swelling, no myalgias, no difficulty walking, normal mobility   Skin:   no rash, no itching, no skin infections, no pressure sores or ulcerations  Psych:   no anxiety, no depression, no nervousness, no unusual recent stress  Eyes:   no blurry vision, no floaters, no recent vision changes, + wears glasses  ENT:   + hearing loss, no loose or painful teeth, no dentures, last saw dentist 5/23  Hematologic:  + easy bruising, no abnormal bleeding, no clotting disorder, no frequent epistaxis  Endocrine:  no diabetes, does not check CBG's at home     Physical Exam:   BP (!) 111/58   Pulse 76   Resp 20   Ht _0  (1.803  m)   Wt 180 lb (81.6 kg)   SpO2 96% Comment: RA  BMI 25.10 kg/m   General:  Fit,  well-appearing  HEENT:  Unremarkable, NCAT,  PERLA, EOMI  Neck:   no JVD, no bruits, no adenopathy   Chest:   clear to auscultation, symmetrical breath sounds, no wheezes, no rhonchi   CV:   RRR, 3/6 systolic murmur RSB, no diastolic murmur  Abdomen:  soft, non-tender, no masses   Extremities:  warm, well-perfused, pulses palpable at ankle, mild ankle edema bilaterally  Rectal/GU  Deferred  Neuro:   Grossly non-focal and symmetrical throughout  Skin:   Clean and dry, no rashes, no breakdown  Diagnostic Tests:  ECHOCARDIOGRAM REPORT         Patient Name:   LARRON ARMOR Date of Exam: 12/23/2021  Medical Rec #:  [id number]         Height:       71.0 in  Accession #:    [id number]        Weight:       184.8 lb  Date of Birth:  03/20/1941         BSA:          2.039 m  Patient Age:    89 years          BP:           105/75 mmHg  Patient Gender: M                 HR:           59 bpm.  Exam Location:  Outpatient   Procedure: 2D Echo, Color Doppler and Cardiac Doppler   Indications:    R06.9 DOE; I35.0 Nonrheumatic aortic (valve) stenosis     History:        Patient has prior history of Echocardiogram examinations,  most                  recent 03/14/2021. CAD, Aortic Valve Disease,  Arrythmias:RBBB,                  PAC and Atrial Fibrillation, Signs/Symptoms:Dyspnea; Risk                  Factors:Hypertension and Non-Smoker. Patient denies chest  pain                  and leg edema. He does have DOE.     Sonographer:    Salvadore Dom RVT, RDCS (AE), RDMS  Referring Phys: [phone] [place]     1. Left ventricular ejection fraction, by estimation, is 60 to 65%. The  left ventricle has normal function. The left ventricle has no regional  wall motion abnormalities. There is mild concentric left ventricular  hypertrophy. Left ventricular diastolic  parameters are consistent with Grade II diastolic dysfunction  (pseudonormalization). Elevated left ventricular end-diastolic pressure.   2. Right  ventricular systolic function is normal. The right ventricular  size is normal. There is moderately elevated pulmonary artery systolic  pressure. The estimated right ventricular systolic pressure is 78.2 mmHg.   3. Left atrial size was severely dilated.   4. The mitral valve is degenerative. Moderate mitral valve regurgitation.  No evidence of mitral stenosis.   5. Tricuspid valve regurgitation is mild to moderate.   6. The aortic valve is calcified. There is severe calcifcation of the  aortic valve. There is moderate thickening of the  aortic valve. Aortic  valve regurgitation is not visualized. Severe aortic valve stenosis.  Aortic valve mean gradient measures 40.0  mmHg. Aortic valve Vmax measures 3.92 m/s.   7. Aortic dilatation noted. There is mild dilatation of the ascending  aorta, measuring 38 mm.   8. The inferior vena cava is normal in size with greater than 50%  respiratory variability, suggesting right atrial pressure of 3 mmHg.   Comparison(s): EF 55%, asc aor 51m, mod AS mean 373mg, peak 46.76m43m,  mild MR, mild MAC, RVSP 38.5mm49m Compared to study dated 03/14/2021 the  DVI has decreased from 0.35 to 0.17 and AVA has decreased from 1.59cm2 to  0.7cm2 AS is now severe.   FINDINGS   Left Ventricle: Left ventricular ejection fraction, by estimation, is 60  to 65%. The left ventricle has normal function. The left ventricle has no  regional wall motion abnormalities. The left ventricular internal cavity  size was normal in size. There is   mild concentric left ventricular hypertrophy. Left ventricular diastolic  parameters are consistent with Grade II diastolic dysfunction  (pseudonormalization). Elevated left ventricular end-diastolic pressure.   Right Ventricle: The right ventricular size is normal. No increase in  right ventricular wall thickness. Right ventricular systolic function is  normal. There is moderately elevated pulmonary artery systolic pressure.  The  tricuspid regurgitant velocity is  3.36 m/s, and with an assumed right atrial pressure of 3 mmHg, the  estimated right ventricular systolic pressure is 48.218.5g.   Left Atrium: Left atrial size was severely dilated.   Right Atrium: Right atrial size was normal in size.   Pericardium: There is no evidence of pericardial effusion.   Mitral Valve: The mitral valve is degenerative in appearance. There is  moderate thickening of the mitral valve leaflet(s). There is mild  calcification of the mitral valve leaflet(s). Moderate mitral valve  regurgitation. No evidence of mitral valve  stenosis.   Tricuspid Valve: The tricuspid valve is normal in structure. Tricuspid  valve regurgitation is mild to moderate. No evidence of tricuspid  stenosis.   Aortic Valve: The aortic valve is calcified. There is severe calcifcation  of the aortic valve. There is moderate thickening of the aortic valve.  Aortic valve regurgitation is not visualized. Severe aortic stenosis is  present. Aortic valve mean gradient  measures 40.0 mmHg. Aortic valve peak gradient measures 61.5 mmHg. Aortic  valve area, by VTI measures 0.70 cm.   Pulmonic Valve: The pulmonic valve was normal in structure. Pulmonic valve  regurgitation is mild to moderate. No evidence of pulmonic stenosis.   Aorta: Aortic dilatation noted. There is mild dilatation of the ascending  aorta, measuring 38 mm.   Venous: The inferior vena cava is normal in size with greater than 50%  respiratory variability, suggesting right atrial pressure of 3 mmHg.   IAS/Shunts: No atrial level shunt detected by color flow Doppler.      LEFT VENTRICLE  PLAX 2D  LVIDd:         5.74 cm      Diastology  LVIDs:         4.10 cm      LV e' medial:    5.43 cm/s  LV PW:         1.28 cm      LV E/e' medial:  22.1  LV IVS:        1.20 cm      LV e' lateral:   4.31 cm/s  LVOT diam:  2.30 cm      LV E/e' lateral: 27.8  LV SV:         72  LV SV Index:   35   LVOT Area:     4.15 cm     LV Volumes (MOD)  LV vol d, MOD A2C: 103.0 ml  LV vol d, MOD A4C: 89.5 ml  LV vol s, MOD A2C: 41.6 ml  LV vol s, MOD A4C: 31.4 ml  LV SV MOD A2C:     61.4 ml  LV SV MOD A4C:     89.5 ml  LV SV MOD BP:      63.3 ml   RIGHT VENTRICLE  RV S prime:     17.60 cm/s  TAPSE (M-mode): 2.8 cm   LEFT ATRIUM              Index        RIGHT ATRIUM           Index  LA diam:        4.90 cm  2.40 cm/m   RA Area:     17.90 cm  LA Vol (A2C):   134.0 ml 65.72 ml/m  RA Volume:   44.60 ml  21.87 ml/m  LA Vol (A4C):   95.1 ml  46.64 ml/m  LA Biplane Vol: 115.0 ml 56.40 ml/m   AORTIC VALVE                     PULMONIC VALVE  AV Area (Vmax):    0.75 cm      PV Vmax:          1.04 m/s  AV Area (Vmean):   0.62 cm      PV Peak grad:     4.3 mmHg  AV Area (VTI):     0.70 cm      PR End Diast Vel: 7.29 msec  AV Vmax:           392.00 cm/s  AV Vmean:          306.000 cm/s  AV VTI:            1.026 m  AV Peak Grad:      61.5 mmHg  AV Mean Grad:      40.0 mmHg  LVOT Vmax:         70.60 cm/s  LVOT Vmean:        45.500 cm/s  LVOT VTI:          0.174 m  LVOT/AV VTI ratio: 0.17     AORTA  Ao Root diam: 3.50 cm  Ao Asc diam:  3.90 cm  Ao Arch diam: 3.4 cm   MITRAL VALVE                TRICUSPID VALVE  MV Area (PHT): 7.66 cm     TR Peak grad:   45.2 mmHg  MV Decel Time: 99 msec      TR Vmax:        336.00 cm/s  MR PISA:        3.08 cm  MR PISA Radius: 0.70 cm     SHUNTS  MV E velocity: 120.00 cm/s  Systemic VTI:  0.17 m  MV A velocity: 96.20 cm/s   Systemic Diam: 2.30 cm  MV E/A ratio:  1.25   Fransico Him MD  Electronically signed by Fransico Him MD  Signature Date/Time: 12/23/2021/10:21:31 AM  Final     Physicians  Panel Physicians Referring Physician Case Authorizing Physician  Sherren Mocha, MD (Primary)     Procedures  RIGHT/LEFT HEART CATH AND CORONARY ANGIOGRAPHY   Conclusion  1.  Patent coronary arteries with no significant change  in coronary anatomy from previous study with no significant stenosis in the left main, LAD, left circumflex, or nondominant RCA.  There is severe stenosis in a small to medium caliber second diagonal branch that is unchanged from the previous study. 2.  Severe calcific aortic stenosis with mean transvalvular gradient of 51 mmHg, calculated aortic valve area of 0.44 cm 3.  Normal right heart pressures with preserved cardiac output.  Plan: continue evaluation for TAVR. Aortic stenosis has progressed significantly since prior evaluation.    Indications  Severe aortic stenosis [I35.0 (ICD-10-CM)]   Procedural Details  Technical Details INDICATION: Severe aortic stenosis, pre-TAVR evaluation  PROCEDURAL DETAILS: There was an indwelling IV in a right antecubital vein. Using normal sterile technique, the IV was changed out for a 5 Fr brachial sheath over a 0.018 inch wire. The right wrist was then prepped, draped, and anesthetized with 1% lidocaine. Using the modified Seldinger technique a 5/6 French Slender sheath was placed in the right radial artery. Intra-arterial verapamil was administered through the radial artery sheath. IV heparin was administered after a JR4 catheter was advanced into the central aorta. A Swan-Ganz catheter was used for the right heart catheterization. Standard protocol was followed for recording of right heart pressures and sampling of oxygen saturations. Fick cardiac output was calculated. Standard Judkins catheters were used for selective coronary angiography. LV pressure is recorded and an aortic valve pullback is performed.  The aortic valve was crossed with an AL-1 catheter and a straight tip wire.  There were no immediate procedural complications. The patient was transferred to the post catheterization recovery area for further monitoring.      Estimated blood loss <50 mL.   During this procedure medications were administered to achieve and maintain moderate conscious  sedation while the patient's heart rate, blood pressure, and oxygen saturation were continuously monitored and I was present face-to-face 100% of this time.     Medications (Filter: Administrations occurring from 1106 to 1155 on 01/23/22)  important  Continuous medications are totaled by the amount administered until 01/23/22 1155.   midazolam (VERSED) injection (mg) Total dose:  2 mg  Date/Time Rate/Dose/Volume Action   01/23/22 1117 2 mg Given    fentaNYL (SUBLIMAZE) injection (mcg) Total dose:  25 mcg  Date/Time Rate/Dose/Volume Action   01/23/22 1117 25 mcg Given    lidocaine (PF) (XYLOCAINE) 1 % injection (mL) Total volume:  4 mL  Date/Time Rate/Dose/Volume Action   01/23/22 1124 2 mL Given   1125 2 mL Given    Radial Cocktail/Verapamil only (mL) Total volume:  10 mL  Date/Time Rate/Dose/Volume Action   01/23/22 1127 10 mL Given    heparin sodium (porcine) injection (Units) Total dose:  4,000 Units  Date/Time Rate/Dose/Volume Action   01/23/22 1137 4,000 Units Given    iohexol (OMNIPAQUE) 350 MG/ML injection (mL) Total volume:  55 mL  Date/Time Rate/Dose/Volume Action   01/23/22 1147 55 mL Given    Heparin (Porcine) in NaCl 1000-0.9 UT/500ML-% SOLN (mL) Total volume:  1,000 mL  Date/Time Rate/Dose/Volume Action   01/23/22 1135 500 mL Given   1135 500 mL Given    Sedation Time  Sedation Time Physician-1: 29 minutes 2 seconds Contrast  Medication Name Total Dose  iohexol (OMNIPAQUE) 350 MG/ML injection 55 mL   Radiation/Fluoro  Fluoro time: 6.4 (min) DAP: 11984 (mGycm2) Cumulative Air Kerma: 882 (mGy) Complications  Complications documented before study signed (01/23/2022 80:03 AM)   No complications were associated with this study.  Documented by Gilford Rile, RT - 01/23/2022 11:51 AM     Coronary Findings  Diagnostic Dominance: Left Left Main  Vessel is normal in caliber. Vessel is angiographically normal. Divides into LAD and LCx     Left Anterior Descending  The vessel exhibits minimal luminal irregularities. The LAD is patent to the apex with mild plaquing but no significant stenosis    First Diagonal Branch  The vessel exhibits minimal luminal irregularities.    Second Diagonal Branch  Medium caliber vessel, unchanged from prior study  2nd Diag-1 lesion is 60% stenosed.  2nd Diag-2 lesion is 85% stenosed. The lesion is calcified. focal stenosis, moderate caliber branch vessel    Left Circumflex  Vessel is large. There is mild diffuse disease throughout the vessel. The circumflex is dominant. The vessel has mild plaquing without any high-grade stenosis present. The obtuse marginal is patent with mild stenosis.    First Obtuse Marginal Branch  There is mild disease in the vessel.    Second Obtuse Marginal Branch  There is mild disease in the vessel.  2nd Mrg lesion is 50% stenosed.    Right Coronary Artery  Vessel is moderate in size. The vessel exhibits minimal luminal irregularities. Nondominant RCA without significant stenosis    Intervention   No interventions have been documented.   Left Heart  Aortic Valve There is severe aortic valve stenosis. The aortic valve is calcified. There is restricted aortic valve motion.     Coronary Diagrams  Diagnostic Dominance: Left  Intervention  Implants     No implant documentation for this case.   Syngo Images   Show images for CARDIAC CATHETERIZATION Images on Long Term Storage   Show images for Lonza, Shimabukuro to Procedure Log  Procedure Log    Hemo Data  Flowsheet Row Most Recent Value  Fick Cardiac Output 5.57 L/min  Fick Cardiac Output Index 2.99 (L/min)/BSA  Aortic Mean Gradient 50.76 mmHg  Aortic Peak Gradient 51.9 mmHg  Aortic Valve Area 0.44  Aortic Value Area Index 0.24 cm2/BSA  RA A Wave 8 mmHg  RA V Wave 5 mmHg  RA Mean 4 mmHg  RV Systolic Pressure 32 mmHg  RV Diastolic Pressure -3 mmHg  RV EDP 10 mmHg  PA Systolic  Pressure 36 mmHg  PA Diastolic Pressure 7 mmHg  PA Mean 19 mmHg  PW A Wave 19 mmHg  PW V Wave 19 mmHg  PW Mean 13 mmHg  AO Systolic Pressure 491 mmHg  AO Diastolic Pressure 53 mmHg  AO Mean 73 mmHg  LV Systolic Pressure 791 mmHg  LV Diastolic Pressure -7 mmHg  LV EDP 10 mmHg  AOp Systolic Pressure 505 mmHg  AOp Diastolic Pressure 53 mmHg  AOp Mean Pressure 74 mmHg  LVp Systolic Pressure 697 mmHg  LVp Diastolic Pressure 5 mmHg  QP/QS 1  TPVR Index 6.36 HRUI  TSVR Index 24.45 HRUI  PVR SVR Ratio 0.09  TPVR/TSVR Ratio 0.26    ADDENDUM REPORT: 02/09/2022 17:35   CLINICAL DATA:  Severe Aortic Stenosis.   EXAM: Cardiac TAVR CT   TECHNIQUE: A non-contrast, gated CT scan was obtained with axial slices of 3 mm through the heart for aortic valve calcium scoring. A 120 kV retrospective,  gated, contrast cardiac scan was obtained. Gantry rotation speed was 250 msecs and collimation was 0.6 mm. Nitroglycerin was not given. The 3D data set was reconstructed in 5% intervals of the 0-95% of the R-R cycle. Systolic and diastolic phases were analyzed on a dedicated workstation using MPR, MIP, and VRT modes. The patient received 100 cc of contrast.   FINDINGS: Image quality: Excellent.   Noise artifact is: Limited.   Valve Morphology: Tricuspid aortic valve with severe diffuse calcifications.   Aortic Valve Calcium score: 2900   Aortic annular dimension:   Phase assessed: 25%   Annular area: 524 mm2   Annular perimeter: 82.6 mm   Max diameter: 29.5 mm   Min diameter: 22.9 mm   Annular and subannular calcification: None.   Membranous septum length: 11.5 mm   Optimal coplanar projection: LAO 4 CRA 4   Coronary Artery Height above Annulus:   Left Main: 13.7 mm   Right Coronary: 16.2 mm   Sinus of Valsalva Measurements:   Non-coronary: 32 mm   Right-coronary: 31 mm   Left-coronary: 32 mm   Sinus of Valsalva Height:   Non-coronary: 20.4 mm    Right-coronary: 18.9 mm   Left-coronary: 21.7 mm   Sinotubular Junction: 30 mm   Ascending Thoracic Aorta: 34 mm   Coronary Arteries: Normal coronary origin. Left dominance. The study was performed without use of NTG and is insufficient for plaque evaluation. Please refer to recent cardiac catheterization for coronary assessment. Severe coronary calcifications noted.   Cardiac Morphology:   Right Atrium: Right atrial size is within normal limits.   Right Ventricle: The right ventricular cavity is within normal limits.   Left Atrium: Left atrial size is dilated in size with no left atrial appendage filling defect.   Left Ventricle: The ventricular cavity size is within normal limits.   Pulmonary arteries: Dilated pulmonary artery suggestive of pulmonary hypertension.   Pulmonary veins: Normal pulmonary venous drainage.   Pericardium: Normal thickness with no significant effusion or calcium present.   Mitral Valve: The mitral valve is normal structure without significant calcification.   Extra-cardiac findings: See attached radiology report for non-cardiac structures.   IMPRESSION: 1. Annular measurements favor a 26 mm S3 (524 mm2). Could consider a 29 mm Evolut Pro.   2. No significant annular or subannular calcifications.   3. Sufficient coronary to annulus distance.   4. Optimal Fluoroscopic Angle for Delivery: LAO 4 CRA 4   [place] T. Audie Box, MD     Electronically Signed   By: Eleonore Chiquito M.D.   On: 02/09/2022 17:35    Addended by Geralynn Rile, MD on 02/09/2022  5:37 PM   Study Result  Narrative & Impression  EXAM: OVER-READ INTERPRETATION  CT CHEST   The following report is a limited chest CT over-read performed by radiologist Dr. Yetta Glassman of [facility], [place] on 02/06/2022. This over-read does not include interpretation of cardiac or coronary anatomy or pathology. The cardiac CTA interpretation by the cardiologist is  attached.   COMPARISON:  None Available.   FINDINGS: Extracardiac findings will be described separately under dictation for contemporaneously obtained CTA chest, abdomen and pelvis.   IMPRESSION: Please see separate dictation for contemporaneously obtained CTA chest, abdomen and pelvis dated 02/06/2022 for full description of relevant extracardiac findings.   Electronically Signed: By: Yetta Glassman M.D. On: 02/06/2022 11:16      Narrative & Impression  CLINICAL DATA:  Aortic valve replacement (TAVR), pre-op eval   EXAM:  CT ANGIOGRAPHY CHEST, ABDOMEN AND PELVIS   TECHNIQUE: Non-contrast CT of the chest was initially obtained.   Multidetector CT imaging through the chest, abdomen and pelvis was performed using the standard protocol during bolus administration of intravenous contrast. Multiplanar reconstructed images and MIPs were obtained and reviewed to evaluate the vascular anatomy.   RADIATION DOSE REDUCTION: This exam was performed according to the departmental dose-optimization program which includes automated exposure control, adjustment of the mA and/or kV according to patient size and/or use of iterative reconstruction technique.   CONTRAST:  125m OMNIPAQUE IOHEXOL 350 MG/ML SOLN   COMPARISON:  None Available.   FINDINGS: CTA CHEST FINDINGS   Cardiovascular: Normal heart size. No pericardial effusion. Severe three-vessel coronary artery calcifications. Aortic valve thickening and calcifications. Mild atherosclerotic disease of the thoracic aorta.   Mediastinum/Nodes: Small hiatal hernia. Thyroid is unremarkable. No pathologically enlarged lymph nodes seen in the chest.   Lungs/Pleura: Central airways are patent. No consolidation, pleural effusion or pneumothorax.   Musculoskeletal: No chest wall abnormality. No acute or significant osseous findings.   CTA ABDOMEN AND PELVIS FINDINGS   Hepatobiliary: Small scattered low-attenuation liver lesions  which are likely simple cysts. Gallbladder is unremarkable. No biliary ductal dilation.   Pancreas: Unremarkable. No pancreatic ductal dilatation or surrounding inflammatory changes.   Spleen: Normal in size without focal abnormality.   Adrenals/Urinary Tract: Bilateral adrenal glands are unremarkable. Exophytic simple cyst of the right kidney and lower pole right renal cyst, additional small low-attenuation renal lesions which are too small to completely characterize, no further follow-up imaging is recommended for these lesions. Bladder is decompressed and thick-walled.   Stomach/Bowel: Stomach is within normal limits. Diverticulosis. No evidence of bowel wall thickening, distention, or inflammatory changes.   Vascular/lymphatic: No pathologically enlarged lymph nodes seen in the abdomen or pelvis. Mild atherosclerotic disease of the abdominal aorta consisting of calcified and noncalcified plaque. Moderate narrowing at the origin of the celiac and mild narrowing at the origins of the celiac, SMA and IMA due to calcified plaque. Mild narrowing at the origin of the right renal artery and severe narrowing at the origin of the left renal artery due to calcified plaque.   Reproductive: Mild prostatomegaly.   Other: Small fat containing right inguinal hernia. No abdominopelvic ascites.   Musculoskeletal: No acute or significant osseous findings.   VASCULAR MEASUREMENTS PERTINENT TO TAVR:   AORTA:   Minimal Aortic Diameter-16.1 mm   Severity of Aortic Calcification-mild   RIGHT PELVIS:   Right Common Iliac Artery -   Minimal Diameter-7.8 mm   Tortuosity-moderate   Calcification-mild   Right External Iliac Artery -   Minimal Diameter-9.3 mm   Tortuosity-mild   Calcification-none   Right Common Femoral Artery -   Minimal Diameter-8.3 mm   Tortuosity-none   Calcification-mild   LEFT PELVIS:   Left Common Iliac Artery -   Minimal Diameter-8.3 mm    Tortuosity-severe   Calcification-mild   Left External Iliac Artery -   Minimal Diameter-9.4 mm   Tortuosity-none   Calcification-none   Left Common Femoral Artery -   Minimal Diameter-10.1 mm   Tortuosity-none   Calcification-none   Review of the MIP images confirms the above findings.   IMPRESSION: 1. Vascular findings and measurements pertinent to potential TAVR procedure, as detailed above. 2. Severe thickening calcification of the aortic valve, compatible with reported clinical history of severe aortic stenosis. 3. Mild aortoiliac atherosclerosis. Three vessel coronary artery disease. 4. Mild bladder wall thickening which can be  seen in the setting of cystitis or outlet obstruction related to prostatomegaly.     Electronically Signed   By: Yetta Glassman M.D.   On: 02/06/2022 12:06     Impression:  This 81 year old gentleman has stage D1, severe, symptomatic aortic stenosis with New York Heart Association class III symptoms of exertional fatigue and shortness of breath consistent with chronic diastolic congestive heart failure.  I have personally reviewed his echocardiogram, cardiac catheterization, and CTA studies.  His echo shows a severely calcified and thickened aortic valve with restricted leaflet mobility.  The mean gradient has increased to 40 mmHg with a valve area of 0.70 cm consistent with severe aortic stenosis.  Cardiac catheterization shows a high-grade stenosis in a small diagonal branch that is unchanged from his prior study.  Otherwise there is no significant coronary disease.  The mean transvalvular gradient was measured at 51 mmHg with a valve area of 0.44 cm.  Right heart pressures were normal with preserved cardiac output.  I agree that aortic valve replacement is indicated in this patient with rapidly progressive symptoms.  Given his advanced age I think that transcatheter aortic valve replacement would be the best treatment option for him.  His  gated cardiac CTA shows anatomy suitable for TAVR using a SAPIEN 3 valve.  His abdominal and pelvic CTA shows adequate pelvic vascular anatomy to allow transfemoral insertion.  The patient and his wife were counseled at length regarding treatment alternatives for management of severe symptomatic aortic stenosis. The risks and benefits of surgical intervention has been discussed in detail. Long-term prognosis with medical therapy was discussed. Alternative approaches such as conventional surgical aortic valve replacement, transcatheter aortic valve replacement, and palliative medical therapy were compared and contrasted at length. This discussion was placed in the context of the patient's own specific clinical presentation and past medical history. All of their questions have been addressed.   Following the decision to proceed with transcatheter aortic valve replacement, a discussion was held regarding what types of management strategies would be attempted intraoperatively in the event of life-threatening complications, including whether or not the patient would be considered a candidate for the use of cardiopulmonary bypass and/or conversion to open sternotomy for attempted surgical intervention.  He is in good shape for his age and I think he would be a candidate for emergent sternotomy to manage any intraoperative complications. The patient is aware of the fact that transient use of cardiopulmonary bypass may be necessary. The patient has been advised of a variety of complications that might develop including but not limited to risks of death, stroke, paravalvular leak, aortic dissection or other major vascular complications, aortic annulus rupture, device embolization, cardiac rupture or perforation, mitral regurgitation, acute myocardial infarction, arrhythmia, heart block or bradycardia requiring permanent pacemaker placement, congestive heart failure, respiratory failure, renal failure, pneumonia,  infection, other late complications related to structural valve deterioration or migration, or other complications that might ultimately cause a temporary or permanent loss of functional independence or other long term morbidity. The patient provides full informed consent for the procedure as described and all questions were answered.      Plan:  He will be scheduled for transfemoral TAVR using a SAPIEN 3 valve in the next few weeks.  I spent 60 minutes performing this consultation and > 50% of this time was spent face to face counseling and coordinating the care of this patient's severe symptomatic aortic stenosis   Gaye Pollack, MD 02/12/2022

## 2022-02-20 NOTE — Pre-Procedure Instructions (Signed)
Surgical Instructions    Your procedure is scheduled on Tuesday, July 11.  Report to [facility] Main Entrance "A" at 7:15 A.M., then check in with the Admitting office.  Call this number if you have problems the morning of surgery:  [phone]   If you have any questions prior to your surgery date call [phone]: Open Monday-Friday 8am-4pm    Remember:  Do not eat or drink after midnight the night before your surgery   Medication Instructions per your Surgeon:   STOP on 7/4 taking any Aleve, Naproxen, Ibuprofen, Motrin, Advil, Goody's, BC's, all herbal medications, fish oil, and all non-prescription vitamins.   STOP taking Eliquis on Thursday, 7/6. You will take your last dose on Wednesday, 7/5.  Continue taking all other medications without change through the day before surgery. On the morning of surgery do not take any medications.           [facility] is not responsible for any belongings or valuables. .   Do NOT Smoke (Tobacco/Vaping)  24 hours prior to your procedure  If you use a CPAP at night, you may bring your mask for your overnight stay.   Contacts, glasses, hearing aids, dentures or partials may not be worn into surgery, please bring cases for these belongings   For patients admitted to the hospital, discharge time will be determined by your treatment team.   Patients discharged the day of surgery will not be allowed to drive home, and someone needs to stay with them for 24 hours.   SURGICAL WAITING ROOM VISITATION Patients having surgery or a procedure in a hospital may have two support people. Children under the age of 9 must have an adult with them who is not the patient. They may stay in the waiting area during the procedure and may switch out with other visitors. If the patient needs to stay at the hospital during part of their recovery, the visitor guidelines for inpatient rooms apply.  Please refer to the [facility] website for the visitor guidelines  for Inpatients (after your surgery is over and you are in a regular room).       Special instructions:    Oral Hygiene is also important to reduce your risk of infection.  Remember - BRUSH YOUR TEETH THE MORNING OF SURGERY WITH YOUR REGULAR TOOTHPASTE   [place]- Preparing For Surgery  Before surgery, you can play an important role. Because skin is not sterile, your skin needs to be as free of germs as possible. You can reduce the number of germs on your skin by washing with CHG (chlorahexidine gluconate) Soap before surgery.  CHG is an antiseptic cleaner which kills germs and bonds with the skin to continue killing germs even after washing.     Please do not use if you have an allergy to CHG or antibacterial soaps. If your skin becomes reddened/irritated stop using the CHG.  Do not shave (including legs and underarms) for at least 48 hours prior to first CHG shower. It is OK to shave your face.  Please follow these instructions carefully.     Shower the NIGHT BEFORE SURGERY and the MORNING OF SURGERY with CHG Soap.   If you chose to wash your hair, wash your hair first as usual with your normal shampoo. After you shampoo, rinse your hair and body thoroughly to remove the shampoo.  Then [employer] and genitals (private parts) with your normal soap and rinse thoroughly to remove soap.  After that  Use CHG Soap as you would any other liquid soap. You can apply CHG directly to the skin and wash gently with a scrungie or a clean washcloth.   Apply the CHG Soap to your body ONLY FROM THE NECK DOWN.  Do not use on open wounds or open sores. Avoid contact with your eyes, ears, mouth and genitals (private parts). Wash Face and genitals (private parts)  with your normal soap.   Wash thoroughly, paying special attention to the area where your surgery will be performed.  Thoroughly rinse your body with warm water from the neck down.  DO NOT shower/wash with your normal soap after using and  rinsing off the CHG Soap.  Pat yourself dry with a CLEAN TOWEL.  Wear CLEAN PAJAMAS to bed the night before surgery  Place CLEAN SHEETS on your bed the night before your surgery  DO NOT SLEEP WITH PETS.   Day of Surgery:  Take a shower with CHG soap. Wear Clean/Comfortable clothing the morning of surgery Do not wear jewelry or makeup Do not wear lotions, powders, perfumes/colognes, or deodorant. Do not shave 48 hours prior to surgery.  Men may shave face and neck. Do not bring valuables to the hospital. Do not wear nail polish, gel polish, artificial nails, or any other type of covering on natural nails (fingers and toes) If you have artificial nails or gel coating that need to be removed by a nail salon, please have this removed prior to surgery. Artificial nails or gel coating may interfere with anesthesia's ability to adequately monitor your vital signs. Remember to brush your teeth WITH YOUR REGULAR TOOTHPASTE.    If you received a COVID test during your pre-op visit, it is requested that you wear a mask when out in public, stay away from anyone that may not be feeling well, and notify your surgeon if you develop symptoms. If you have been in contact with anyone that has tested positive in the last 10 days, please notify your surgeon.    Please read over the following fact sheets that you were given.

## 2022-02-21 ENCOUNTER — Encounter (HOSPITAL_COMMUNITY): Payer: Self-pay

## 2022-02-21 ENCOUNTER — Encounter (HOSPITAL_COMMUNITY)
Admission: RE | Admit: 2022-02-21 | Discharge: 2022-02-21 | Disposition: A | Discharge status: Home / Self Care | Payer: Medicare Other | Source: Ambulatory Visit | Attending: Internal Medicine | Admitting: Internal Medicine

## 2022-02-21 ENCOUNTER — Ambulatory Visit (HOSPITAL_COMMUNITY)
Admission: RE | Admit: 2022-02-21 | Discharge: 2022-02-21 | Disposition: A | Discharge status: Home / Self Care | Payer: Medicare Other | Source: Ambulatory Visit | Attending: Internal Medicine | Admitting: Internal Medicine

## 2022-02-21 ENCOUNTER — Other Ambulatory Visit: Payer: Self-pay

## 2022-02-21 VITALS — BP 112/74 | HR 94 | Temp 98.0°F | Resp 18 | Ht 71.0 in | Wt 186.4 lb

## 2022-02-21 DIAGNOSIS — I350 Nonrheumatic aortic (valve) stenosis: Secondary | ICD-10-CM | POA: Insufficient documentation

## 2022-02-21 DIAGNOSIS — Z20822 Contact with and (suspected) exposure to covid-19: Secondary | ICD-10-CM | POA: Insufficient documentation

## 2022-02-21 DIAGNOSIS — Z01818 Encounter for other preprocedural examination: Secondary | ICD-10-CM | POA: Diagnosis not present

## 2022-02-21 HISTORY — DX: Personal history of other diseases of the digestive system: Z87.19

## 2022-02-21 LAB — CBC
HCT: 40.8 % (ref 39.0–52.0)
Hemoglobin: 13.7 g/dL (ref 13.0–17.0)
MCH: 29.8 pg (ref 26.0–34.0)
MCHC: 33.6 g/dL (ref 30.0–36.0)
MCV: 88.9 fL (ref 80.0–100.0)
Platelets: 132 10*3/uL — ABNORMAL LOW (ref 150–400)
RBC: 4.59 MIL/uL (ref 4.22–5.81)
RDW: 12.7 % (ref 11.5–15.5)
WBC: 5.7 10*3/uL (ref 4.0–10.5)
nRBC: 0 % (ref 0.0–0.2)

## 2022-02-21 LAB — COMPREHENSIVE METABOLIC PANEL
ALT: 19 U/L (ref 0–44)
AST: 24 U/L (ref 15–41)
Albumin: 4.1 g/dL (ref 3.5–5.0)
Alkaline Phosphatase: 58 U/L (ref 38–126)
Anion gap: 6 (ref 5–15)
BUN: 20 mg/dL (ref 8–23)
CO2: 27 mmol/L (ref 22–32)
Calcium: 9.4 mg/dL (ref 8.9–10.3)
Chloride: 105 mmol/L (ref 98–111)
Creatinine, Ser: 1.07 mg/dL (ref 0.61–1.24)
GFR, Estimated: >60 mL/min (ref >60)
Glucose, Bld: 96 mg/dL (ref 70–99)
Potassium: 4.1 mmol/L (ref 3.5–5.1)
Sodium: 138 mmol/L (ref 135–145)
Total Bilirubin: 1.3 mg/dL — ABNORMAL HIGH (ref 0.3–1.2)
Total Protein: 6.7 g/dL (ref 6.5–8.1)

## 2022-02-21 LAB — TYPE AND SCREEN
ABO/RH(D): O POS
Antibody Screen: NEGATIVE

## 2022-02-21 LAB — PROTIME-INR
INR: 1.1 (ref 0.8–1.2)
Prothrombin Time: 13.7 seconds (ref 11.4–15.2)

## 2022-02-21 LAB — URINALYSIS, ROUTINE W REFLEX MICROSCOPIC
Bilirubin Urine: NEGATIVE
Glucose, UA: NEGATIVE mg/dL
Hgb urine dipstick: NEGATIVE
Ketones, ur: NEGATIVE mg/dL
Leukocytes,Ua: NEGATIVE
Nitrite: NEGATIVE
Protein, ur: NEGATIVE mg/dL
Specific Gravity, Urine: 1.016 (ref 1.005–1.030)
pH: 6 (ref 5.0–8.0)

## 2022-02-21 LAB — SURGICAL PCR SCREEN
MRSA, PCR: NEGATIVE
Staphylococcus aureus: NEGATIVE

## 2022-02-21 LAB — SARS CORONAVIRUS 2 (TAT 6-24 HRS): SARS Coronavirus 2: NEGATIVE

## 2022-02-21 NOTE — Progress Notes (Signed)
PCP - Dr. [name] Cardiologist - Dr. Eleonore Chiquito  PPM/ICD - denies   Chest x-ray - 02/21/22 EKG - 02/21/22 Stress Test - 08/29/20 ECHO - 12/23/21 Cardiac Cath - 01/23/22  Sleep Study - denies  DM- denies  Blood Thinner Instructions: Last dose Xarelto 7/5 per order. Aspirin Instructions: n/a  ERAS Protcol - no, NPO   COVID TEST- 02/21/22   Anesthesia review: yes, cardiac hx  Patient denies shortness of breath, fever, cough and chest pain at PAT appointment   All instructions explained to the patient, with a verbal understanding of the material. Patient agrees to go over the instructions while at home for a better understanding. Patient also instructed to wear a mask in public after being tested for COVID-19. The opportunity to ask questions was provided.

## 2022-02-24 MED ORDER — HEPARIN 30,000 UNITS/1000 ML (OHS) CELLSAVER SOLUTION
Status: DC
  Filled 2022-02-24 (×2): qty 1000

## 2022-02-24 MED ORDER — DEXMEDETOMIDINE HCL IN NACL 400 MCG/100ML IV SOLN
0.1000 ug/kg/h | INTRAVENOUS | Status: AC
  Administered 2022-02-25: 1 ug/kg/h via INTRAVENOUS
  Filled 2022-02-24 (×2): qty 100

## 2022-02-24 MED ORDER — NOREPINEPHRINE 4 MG/250ML-% IV SOLN
0.0000 ug/min | INTRAVENOUS | Status: DC
  Filled 2022-02-24: qty 250

## 2022-02-24 MED ORDER — CEFAZOLIN SODIUM-DEXTROSE 2-4 GM/100ML-% IV SOLN
2.0000 g | INTRAVENOUS | Status: AC
  Administered 2022-02-25: 2 g via INTRAVENOUS
  Filled 2022-02-24: qty 100

## 2022-02-24 MED ORDER — POTASSIUM CHLORIDE 2 MEQ/ML IV SOLN
80.0000 meq | INTRAVENOUS | Status: DC
  Filled 2022-02-24 (×2): qty 40

## 2022-02-24 MED ORDER — MAGNESIUM SULFATE 50 % IJ SOLN
40.0000 meq | INTRAMUSCULAR | Status: DC
  Filled 2022-02-24 (×2): qty 9.85

## 2022-02-24 NOTE — H&P (Signed)
[address]       [place] [address]             [phone]      Cardiothoracic Surgery Admission History and Physical   PCP is [name], MD Referring Provider is Sherren Mocha, MD Primary Cardiologist is Evalina Field, MD   Reason for admission: Severe aortic stenosis   HPI:   The patient is an 81 year old gentleman with a history of paroxysmal atrial fibrillation, degenerative joint disease status post bilateral knee replacements with revision of his left knee replacement last year, coronary artery disease and aortic stenosis.  He underwent catheterization in January 2022 after presenting with exertional shortness of breath and having an abnormal nuclear stress test with a reversible perfusion defect in the lateral apex.  Catheterization showed a high-grade diagonal stenosis but no other obstructive disease.  He was continued on medical therapy.  An echocardiogram in 2020 showed a mean gradient across aortic valve of 16 mmHg.  In 2022 that it increased to 32 mmHg.  His most recent echo on 12/23/2021 shows an increase in the mean gradient to 40 mmHg with a peak gradient of 61.5 mmHg.  Valve area by VTI 0.70 cm.  Left ventricular ejection fraction is 60 to 65% with mild concentric LVH and grade 2 diastolic dysfunction.   The patient lives with his wife.  [place] and enjoy playing [place].  He had his left knee replacement revised last year and then developed COVID-pneumonia and paroxysmal atrial fibrillation after that and has not completely recovered.  He has developed progressive exertional shortness of breath and fatigue and reports that he has only been able to play less than 9 holes of golf without being exhausted.  He gets short of breath walking up a single flight of stairs.  He has had lightheadedness with standing up and physical activity but denies syncope.  His wife has noted some mild cognitive dysfunction over the  past year with memory loss.       Past Medical History:  Diagnosis Date   Complication of anesthesia      difficult to wake up after surgery   Coronary artery disease     DJD (degenerative joint disease)      Dr. Noemi Chapel end stages of DJD 2011   Hard of hearing     PAF (paroxysmal atrial fibrillation) ([place])     Severe aortic stenosis     Urinary frequency     UTI (lower urinary tract infection)             Past Surgical History:  Procedure Laterality Date   ANKLE SURGERY Right     COLONOSCOPY W/ BIOPSIES AND POLYPECTOMY       ELBOW SURGERY Right     EYE SURGERY       HERNIA REPAIR       JOINT REPLACEMENT       KNEE SURGERY Right      x4   LEFT HEART CATH AND CORONARY ANGIOGRAPHY N/A 09/12/2020    Procedure: LEFT HEART CATH AND CORONARY ANGIOGRAPHY;  Surgeon: Sherren Mocha, MD;  Location: [facility];  Service: Cardiovascular;  Laterality: N/A;   MULTIPLE TOOTH EXTRACTIONS       RIGHT/LEFT HEART CATH AND CORONARY ANGIOGRAPHY N/A 01/23/2022    Procedure: RIGHT/LEFT HEART CATH AND CORONARY ANGIOGRAPHY;  Surgeon: Sherren Mocha, MD;  Location: [facility];  Service: Cardiovascular;  Laterality: N/A;   TOTAL KNEE ARTHROPLASTY Right     TOTAL KNEE ARTHROPLASTY Left 06/18/2015    Procedure: TOTAL KNEE ARTHROPLASTY;  Surgeon: Frederik Pear, MD;  Location: [facility];  Service: Orthopedics;  Laterality: Left;   TOTAL KNEE REVISION Right 02/11/2021    Procedure: RIGHT TOTAL KNEE REVISION;  Surgeon: Frederik Pear, MD;  Location: [facility];  Service: Orthopedics;  Laterality: Right;           Family History  Problem Relation Age of Onset   Heart disease Father     Heart attack Mother          age 9   Breast cancer Sister     Breast cancer Sister        Social History         Socioeconomic History   Marital status: Married      Spouse name: Not on file   Number of children: 4   Years of education: Not on file   Highest education level: Not on file  Occupational  History   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  Tobacco Use   Smoking status: Never   Smokeless tobacco: Never  Vaping Use   Vaping Use: Never used  Substance and Sexual Activity   Alcohol use: No   Drug use: No   Sexual activity: Yes  Other Topics Concern   Not on file  Social History Narrative   Not on file    Social Determinants of Health    Financial Resource Strain: Not on file  Food Insecurity: Not on file  Transportation Needs: Not on file  Physical Activity: Not on file  Stress: Not on file  Social Connections: Not on file  Intimate Partner Violence: Not on file             Prior to Admission medications   Medication Sig Start Date End Date Taking? Authorizing Provider  acetaminophen (TYLENOL) 650 MG CR tablet Take 650 mg by mouth every 8 (eight) hours as needed for pain.     Yes [provider]  alfuzosin (UROXATRAL) 10 MG 24 hr tablet Take 1 tablet (10 mg total) by mouth at bedtime. 05/13/21   Yes McKenzie, Candee Furbish, MD  amoxicillin (AMOXIL) 500 MG capsule Take 2,000 mg by mouth See admin instructions. Take 2000 mg 1 hour prior to dental work     Yes [provider]  apixaban (ELIQUIS) 5 MG TABS tablet TAKE 1 TABLET BY MOUTH TWICE A DAY 06/12/21   Yes O'Neal, Cassie Freer, MD  atorvastatin (LIPITOR) 10 MG tablet Take 1 tablet (10 mg total) by mouth daily. Patient taking differently: Take 10 mg by mouth every Monday, Wednesday, and Friday. 10/15/20 02/12/22 Yes O'Neal, Cassie Freer, MD  cholecalciferol (VITAMIN D3) 25 MCG (1000 UNIT) tablet Take 1,000 Units by mouth daily.     Yes [provider]  doxycycline (VIBRA-TABS) 100 MG tablet Take 100 mg by mouth daily. 12/10/21   Yes [provider]  finasteride (PROSCAR) 5 MG tablet Take 1 tablet (5 mg total) by mouth daily at 12 noon. 05/13/21   Yes McKenzie, Candee Furbish, MD  metoprolol succinate (TOPROL XL) 25 MG 24 hr tablet Take 1 tablet (25 mg total) by mouth daily. 01/09/22   Yes Sherren Mocha, MD  Omega-3 Fatty Acids (FISH OIL) 1000 MG CAPS Take 1,000 mg by mouth daily.     Yes [provider]  valACYclovir (VALTREX) 500 MG tablet Take 500 mg by mouth 2 (two)  times daily as needed (Cold sore).  05/15/15   Yes [provider]  vitamin B-12 (CYANOCOBALAMIN) 1000 MCG tablet Take 1,000 mcg by mouth daily.     Yes [provider]            Current Outpatient Medications  Medication Sig Dispense Refill   acetaminophen (TYLENOL) 650 MG CR tablet Take 650 mg by mouth every 8 (eight) hours as needed for pain.       alfuzosin (UROXATRAL) 10 MG 24 hr tablet Take 1 tablet (10 mg total) by mouth at bedtime. 30 tablet 11   amoxicillin (AMOXIL) 500 MG capsule Take 2,000 mg by mouth See admin instructions. Take 2000 mg 1 hour prior to dental work       apixaban (ELIQUIS) 5 MG TABS tablet TAKE 1 TABLET BY MOUTH TWICE A DAY 180 tablet 1   atorvastatin (LIPITOR) 10 MG tablet Take 1 tablet (10 mg total) by mouth daily. (Patient taking differently: Take 10 mg by mouth every Monday, Wednesday, and Friday.) 90 tablet 3   cholecalciferol (VITAMIN D3) 25 MCG (1000 UNIT) tablet Take 1,000 Units by mouth daily.       doxycycline (VIBRA-TABS) 100 MG tablet Take 100 mg by mouth daily.       finasteride (PROSCAR) 5 MG tablet Take 1 tablet (5 mg total) by mouth daily at 12 noon. 90 tablet 3   metoprolol succinate (TOPROL XL) 25 MG 24 hr tablet Take 1 tablet (25 mg total) by mouth daily. 90 tablet 3   Omega-3 Fatty Acids (FISH OIL) 1000 MG CAPS Take 1,000 mg by mouth daily.       valACYclovir (VALTREX) 500 MG tablet Take 500 mg by mouth 2 (two) times daily as needed (Cold sore).    2   vitamin B-12 (CYANOCOBALAMIN) 1000 MCG tablet Take 1,000 mcg by mouth daily.        No current facility-administered medications for this visit.      No Known Allergies       Review of Systems:               General:                      normal appetite, + decreased energy, no weight gain,  no weight loss, no fever             Cardiac:                       no chest pain with exertion, no chest pain at rest, +SOB with mild exertion, + resting SOB, no PND, no orthopnea, no palpitations, + arrhythmia, + atrial fibrillation, + LE edema, + dizzy spells, no syncope             Respiratory:                 + shortness of breath, no home oxygen, no productive cough, no dry cough, no bronchitis, no wheezing, no hemoptysis, no asthma, no pain with inspiration or cough, no sleep apnea, no CPAP at night             GI:                               no difficulty swallowing, no reflux, no frequent heartburn, + hiatal hernia, no abdominal pain, no constipation, no diarrhea, no hematochezia, no hematemesis,  no melena             GU:                              no dysuria,  + frequency, no urinary tract infection, no hematuria, + enlarged prostate, no kidney stones, no kidney disease             Vascular:                     no pain suggestive of claudication, no pain in feet, no leg cramps, no varicose veins, no DVT, no non-healing foot ulcer             Neuro:                         no stroke, no TIA's, no seizures, no headaches, no temporary blindness one eye,  no slurred speech, no peripheral neuropathy, no chronic pain, no instability of gait, + memory/cognitive dysfunction             Musculoskeletal:         + arthritis - primarily involving the knees, no joint swelling, no myalgias, no difficulty walking, normal mobility              Skin:                            no rash, no itching, no skin infections, no pressure sores or ulcerations             Psych:                         no anxiety, no depression, no nervousness, no unusual recent stress             Eyes:                           no blurry vision, no floaters, no recent vision changes, + wears glasses            ENT:                            + hearing loss, no loose or painful teeth, no dentures, last saw dentist 5/23              Hematologic:               + easy bruising, no abnormal bleeding, no clotting disorder, no frequent epistaxis             Endocrine:                   no diabetes, does not check CBG's at home                            Physical Exam:               BP (!) 111/58   Pulse 76   Resp 20   Ht _0  (1.803 m)   Wt 180 lb (81.6 kg)   SpO2 96% Comment: RA  BMI 25.10 kg/m              General:  Fit,  well-appearing             HEENT:                       Unremarkable, NCAT, PERLA, EOMI             Neck:                           no JVD, no bruits, no adenopathy              Chest:                          clear to auscultation, symmetrical breath sounds, no wheezes, no rhonchi              CV:                              RRR, 3/6 systolic murmur RSB, no diastolic murmur             Abdomen:                    soft, non-tender, no masses              Extremities:                 warm, well-perfused, pulses palpable at ankle, mild ankle edema bilaterally             Rectal/GU                   Deferred             Neuro:                         Grossly non-focal and symmetrical throughout             Skin:                            Clean and dry, no rashes, no breakdown   Diagnostic Tests:   ECHOCARDIOGRAM REPORT         Patient Name:   Gerald Spears Date of Exam: 12/23/2021  Medical Rec #:  [id number]         Height:       71.0 in  Accession #:    [id number]        Weight:       184.8 lb  Date of Birth:  06-05-1941         BSA:          2.039 m  Patient Age:    62 years          BP:           105/75 mmHg  Patient Gender: M                 HR:           59 bpm.  Exam Location:  Outpatient   Procedure: 2D Echo, Color Doppler and Cardiac Doppler   Indications:    R06.9 DOE; I35.0 Nonrheumatic aortic (valve) stenosis     History:        Patient has prior history of Echocardiogram examinations,  most  recent 03/14/2021. CAD, Aortic Valve Disease,   Arrythmias:RBBB,                  PAC and Atrial Fibrillation, Signs/Symptoms:Dyspnea; Risk                  Factors:Hypertension and Non-Smoker. Patient denies chest  pain                  and leg edema. He does have DOE.     Sonographer:    Salvadore Dom RVT, RDCS (AE), RDMS  Referring Phys: [phone] [place]     1. Left ventricular ejection fraction, by estimation, is 60 to 65%. The  left ventricle has normal function. The left ventricle has no regional  wall motion abnormalities. There is mild concentric left ventricular  hypertrophy. Left ventricular diastolic  parameters are consistent with Grade II diastolic dysfunction  (pseudonormalization). Elevated left ventricular end-diastolic pressure.   2. Right ventricular systolic function is normal. The right ventricular  size is normal. There is moderately elevated pulmonary artery systolic  pressure. The estimated right ventricular systolic pressure is 08.6 mmHg.   3. Left atrial size was severely dilated.   4. The mitral valve is degenerative. Moderate mitral valve regurgitation.  No evidence of mitral stenosis.   5. Tricuspid valve regurgitation is mild to moderate.   6. The aortic valve is calcified. There is severe calcifcation of the  aortic valve. There is moderate thickening of the aortic valve. Aortic  valve regurgitation is not visualized. Severe aortic valve stenosis.  Aortic valve mean gradient measures 40.0  mmHg. Aortic valve Vmax measures 3.92 m/s.   7. Aortic dilatation noted. There is mild dilatation of the ascending  aorta, measuring 38 mm.   8. The inferior vena cava is normal in size with greater than 50%  respiratory variability, suggesting right atrial pressure of 3 mmHg.   Comparison(s): EF 55%, asc aor 15m, mod AS mean 360mg, peak 46.61m51m,  mild MR, mild MAC, RVSP 38.5mm68m Compared to study dated 03/14/2021 the  DVI has decreased from 0.35 to 0.17 and AVA has decreased from  1.59cm2 to  0.7cm2 AS is now severe.   FINDINGS   Left Ventricle: Left ventricular ejection fraction, by estimation, is 60  to 65%. The left ventricle has normal function. The left ventricle has no  regional wall motion abnormalities. The left ventricular internal cavity  size was normal in size. There is   mild concentric left ventricular hypertrophy. Left ventricular diastolic  parameters are consistent with Grade II diastolic dysfunction  (pseudonormalization). Elevated left ventricular end-diastolic pressure.   Right Ventricle: The right ventricular size is normal. No increase in  right ventricular wall thickness. Right ventricular systolic function is  normal. There is moderately elevated pulmonary artery systolic pressure.  The tricuspid regurgitant velocity is  3.36 m/s, and with an assumed right atrial pressure of 3 mmHg, the  estimated right ventricular systolic pressure is 48.257.8g.   Left Atrium: Left atrial size was severely dilated.   Right Atrium: Right atrial size was normal in size.   Pericardium: There is no evidence of pericardial effusion.   Mitral Valve: The mitral valve is degenerative in appearance. There is  moderate thickening of the mitral valve leaflet(s). There is mild  calcification of the mitral valve leaflet(s). Moderate mitral valve  regurgitation. No evidence of mitral valve  stenosis.   Tricuspid Valve: The tricuspid valve is normal in structure. Tricuspid  valve regurgitation is mild to moderate. No evidence of tricuspid  stenosis.   Aortic Valve: The aortic valve is calcified. There is severe calcifcation  of the aortic valve. There is moderate thickening of the aortic valve.  Aortic valve regurgitation is not visualized. Severe aortic stenosis is  present. Aortic valve mean gradient  measures 40.0 mmHg. Aortic valve peak gradient measures 61.5 mmHg. Aortic  valve area, by VTI measures 0.70 cm.   Pulmonic Valve: The pulmonic valve was  normal in structure. Pulmonic valve  regurgitation is mild to moderate. No evidence of pulmonic stenosis.   Aorta: Aortic dilatation noted. There is mild dilatation of the ascending  aorta, measuring 38 mm.   Venous: The inferior vena cava is normal in size with greater than 50%  respiratory variability, suggesting right atrial pressure of 3 mmHg.   IAS/Shunts: No atrial level shunt detected by color flow Doppler.      LEFT VENTRICLE  PLAX 2D  LVIDd:         5.74 cm      Diastology  LVIDs:         4.10 cm      LV e' medial:    5.43 cm/s  LV PW:         1.28 cm      LV E/e' medial:  22.1  LV IVS:        1.20 cm      LV e' lateral:   4.31 cm/s  LVOT diam:     2.30 cm      LV E/e' lateral: 27.8  LV SV:         72  LV SV Index:   35  LVOT Area:     4.15 cm     LV Volumes (MOD)  LV vol d, MOD A2C: 103.0 ml  LV vol d, MOD A4C: 89.5 ml  LV vol s, MOD A2C: 41.6 ml  LV vol s, MOD A4C: 31.4 ml  LV SV MOD A2C:     61.4 ml  LV SV MOD A4C:     89.5 ml  LV SV MOD BP:      63.3 ml   RIGHT VENTRICLE  RV S prime:     17.60 cm/s  TAPSE (M-mode): 2.8 cm   LEFT ATRIUM              Index        RIGHT ATRIUM           Index  LA diam:        4.90 cm  2.40 cm/m   RA Area:     17.90 cm  LA Vol (A2C):   134.0 ml 65.72 ml/m  RA Volume:   44.60 ml  21.87 ml/m  LA Vol (A4C):   95.1 ml  46.64 ml/m  LA Biplane Vol: 115.0 ml 56.40 ml/m   AORTIC VALVE                     PULMONIC VALVE  AV Area (Vmax):    0.75 cm      PV Vmax:          1.04 m/s  AV Area (Vmean):   0.62 cm      PV Peak grad:     4.3 mmHg  AV Area (VTI):     0.70 cm      PR End Diast Vel: 7.29 msec  AV Vmax:  392.00 cm/s  AV Vmean:          306.000 cm/s  AV VTI:            1.026 m  AV Peak Grad:      61.5 mmHg  AV Mean Grad:      40.0 mmHg  LVOT Vmax:         70.60 cm/s  LVOT Vmean:        45.500 cm/s  LVOT VTI:          0.174 m  LVOT/AV VTI ratio: 0.17     AORTA  Ao Root diam: 3.50 cm  Ao Asc diam:  3.90 cm   Ao Arch diam: 3.4 cm   MITRAL VALVE                TRICUSPID VALVE  MV Area (PHT): 7.66 cm     TR Peak grad:   45.2 mmHg  MV Decel Time: 99 msec      TR Vmax:        336.00 cm/s  MR PISA:        3.08 cm  MR PISA Radius: 0.70 cm     SHUNTS  MV E velocity: 120.00 cm/s  Systemic VTI:  0.17 m  MV A velocity: 96.20 cm/s   Systemic Diam: 2.30 cm  MV E/A ratio:  1.25   Fransico Him MD  Electronically signed by Fransico Him MD  Signature Date/Time: 12/23/2021/10:21:31 AM         Final       Physicians   Panel Physicians Referring Physician Case Authorizing Physician  Sherren Mocha, MD (Primary)        Procedures   RIGHT/LEFT HEART CATH AND CORONARY ANGIOGRAPHY    Conclusion   1.  Patent coronary arteries with no significant change in coronary anatomy from previous study with no significant stenosis in the left main, LAD, left circumflex, or nondominant RCA.  There is severe stenosis in a small to medium caliber second diagonal branch that is unchanged from the previous study. 2.  Severe calcific aortic stenosis with mean transvalvular gradient of 51 mmHg, calculated aortic valve area of 0.44 cm 3.  Normal right heart pressures with preserved cardiac output.  Plan: continue evaluation for TAVR. Aortic stenosis has progressed significantly since prior evaluation.    Indications   Severe aortic stenosis [I35.0 (ICD-10-CM)]    Procedural Details   Technical Details INDICATION: Severe aortic stenosis, pre-TAVR evaluation  PROCEDURAL DETAILS: There was an indwelling IV in a right antecubital vein. Using normal sterile technique, the IV was changed out for a 5 Fr brachial sheath over a 0.018 inch wire. The right wrist was then prepped, draped, and anesthetized with 1% lidocaine. Using the modified Seldinger technique a 5/6 French Slender sheath was placed in the right radial artery. Intra-arterial verapamil was administered through the radial artery sheath. IV heparin was  administered after a JR4 catheter was advanced into the central aorta. A Swan-Ganz catheter was used for the right heart catheterization. Standard protocol was followed for recording of right heart pressures and sampling of oxygen saturations. Fick cardiac output was calculated. Standard Judkins catheters were used for selective coronary angiography. LV pressure is recorded and an aortic valve pullback is performed.  The aortic valve was crossed with an AL-1 catheter and a straight tip wire.  There were no immediate procedural complications. The patient was transferred to the post catheterization recovery area for further monitoring.  Estimated blood loss <50 mL.   During this procedure medications were administered to achieve and maintain moderate conscious sedation while the patient's heart rate, blood pressure, and oxygen saturation were continuously monitored and I was present face-to-face 100% of this time.      Medications (Filter: Administrations occurring from 1106 to 1155 on 01/23/22)  important  Continuous medications are totaled by the amount administered until 01/23/22 1155.    midazolam (VERSED) injection (mg) Total dose:  2 mg  Date/Time Rate/Dose/Volume Action    01/23/22 1117 2 mg Given      fentaNYL (SUBLIMAZE) injection (mcg) Total dose:  25 mcg  Date/Time Rate/Dose/Volume Action    01/23/22 1117 25 mcg Given      lidocaine (PF) (XYLOCAINE) 1 % injection (mL) Total volume:  4 mL  Date/Time Rate/Dose/Volume Action    01/23/22 1124 2 mL Given    1125 2 mL Given      Radial Cocktail/Verapamil only (mL) Total volume:  10 mL  Date/Time Rate/Dose/Volume Action    01/23/22 1127 10 mL Given      heparin sodium (porcine) injection (Units) Total dose:  4,000 Units  Date/Time Rate/Dose/Volume Action    01/23/22 1137 4,000 Units Given      iohexol (OMNIPAQUE) 350 MG/ML injection (mL) Total volume:  55 mL  Date/Time Rate/Dose/Volume Action    01/23/22 1147 55  mL Given      Heparin (Porcine) in NaCl 1000-0.9 UT/500ML-% SOLN (mL) Total volume:  1,000 mL  Date/Time Rate/Dose/Volume Action    01/23/22 1135 500 mL Given    1135 500 mL Given      Sedation Time   Sedation Time Physician-1: 29 minutes 2 seconds Contrast   Medication Name Total Dose  iohexol (OMNIPAQUE) 350 MG/ML injection 55 mL    Radiation/Fluoro   Fluoro time: 6.4 (min) DAP: 11984 (mGycm2) Cumulative Air Kerma: 811 (mGy) Complications      Complications documented before study signed (01/23/2022 57:26 AM)     No complications were associated with this study.  Documented by Gilford Rile, RT - 01/23/2022 11:51 AM      Coronary Findings   Diagnostic Dominance: Left Left Main  Vessel is normal in caliber. Vessel is angiographically normal. Divides into LAD and LCx    Left Anterior Descending  The vessel exhibits minimal luminal irregularities. The LAD is patent to the apex with mild plaquing but no significant stenosis    First Diagonal Branch  The vessel exhibits minimal luminal irregularities.    Second Diagonal Branch  Medium caliber vessel, unchanged from prior study  2nd Diag-1 lesion is 60% stenosed.  2nd Diag-2 lesion is 85% stenosed. The lesion is calcified. focal stenosis, moderate caliber branch vessel    Left Circumflex  Vessel is large. There is mild diffuse disease throughout the vessel. The circumflex is dominant. The vessel has mild plaquing without any high-grade stenosis present. The obtuse marginal is patent with mild stenosis.    First Obtuse Marginal Branch  There is mild disease in the vessel.    Second Obtuse Marginal Branch  There is mild disease in the vessel.  2nd Mrg lesion is 50% stenosed.    Right Coronary Artery  Vessel is moderate in size. The vessel exhibits minimal luminal irregularities. Nondominant RCA without significant stenosis    Intervention    No interventions have been documented.    Left Heart   Aortic  Valve There is severe aortic valve stenosis. The aortic valve is calcified.  There is restricted aortic valve motion.      Coronary Diagrams   Diagnostic Dominance: Left  Intervention   Implants      No implant documentation for this case.    Syngo Images    Show images for CARDIAC CATHETERIZATION Images on Long Term Storage    Show images for [place], Proffit to Procedure Log   Procedure Log    Hemo Data   Flowsheet Row Most Recent Value  Fick Cardiac Output 5.57 L/min  Fick Cardiac Output Index 2.99 (L/min)/BSA  Aortic Mean Gradient 50.76 mmHg  Aortic Peak Gradient 51.9 mmHg  Aortic Valve Area 0.44  Aortic Value Area Index 0.24 cm2/BSA  RA A Wave 8 mmHg  RA V Wave 5 mmHg  RA Mean 4 mmHg  RV Systolic Pressure 32 mmHg  RV Diastolic Pressure -3 mmHg  RV EDP 10 mmHg  PA Systolic Pressure 36 mmHg  PA Diastolic Pressure 7 mmHg  PA Mean 19 mmHg  PW A Wave 19 mmHg  PW V Wave 19 mmHg  PW Mean 13 mmHg  AO Systolic Pressure 322 mmHg  AO Diastolic Pressure 53 mmHg  AO Mean 73 mmHg  LV Systolic Pressure 025 mmHg  LV Diastolic Pressure -7 mmHg  LV EDP 10 mmHg  AOp Systolic Pressure 427 mmHg  AOp Diastolic Pressure 53 mmHg  AOp Mean Pressure 74 mmHg  LVp Systolic Pressure 062 mmHg  LVp Diastolic Pressure 5 mmHg  QP/QS 1  TPVR Index 6.36 HRUI  TSVR Index 24.45 HRUI  PVR SVR Ratio 0.09  TPVR/TSVR Ratio 0.26      ADDENDUM REPORT: 02/09/2022 17:35   CLINICAL DATA:  Severe Aortic Stenosis.   EXAM: Cardiac TAVR CT   TECHNIQUE: A non-contrast, gated CT scan was obtained with axial slices of 3 mm through the heart for aortic valve calcium scoring. A 120 kV retrospective, gated, contrast cardiac scan was obtained. Gantry rotation speed was 250 msecs and collimation was 0.6 mm. Nitroglycerin was not given. The 3D data set was reconstructed in 5% intervals of the 0-95% of the R-R cycle. Systolic and diastolic phases were analyzed on a dedicated workstation  using MPR, MIP, and VRT modes. The patient received 100 cc of contrast.   FINDINGS: Image quality: Excellent.   Noise artifact is: Limited.   Valve Morphology: Tricuspid aortic valve with severe diffuse calcifications.   Aortic Valve Calcium score: 2900   Aortic annular dimension:   Phase assessed: 25%   Annular area: 524 mm2   Annular perimeter: 82.6 mm   Max diameter: 29.5 mm   Min diameter: 22.9 mm   Annular and subannular calcification: None.   Membranous septum length: 11.5 mm   Optimal coplanar projection: LAO 4 CRA 4   Coronary Artery Height above Annulus:   Left Main: 13.7 mm   Right Coronary: 16.2 mm   Sinus of Valsalva Measurements:   Non-coronary: 32 mm   Right-coronary: 31 mm   Left-coronary: 32 mm   Sinus of Valsalva Height:   Non-coronary: 20.4 mm   Right-coronary: 18.9 mm   Left-coronary: 21.7 mm   Sinotubular Junction: 30 mm   Ascending Thoracic Aorta: 34 mm   Coronary Arteries: Normal coronary origin. Left dominance. The study was performed without use of NTG and is insufficient for plaque evaluation. Please refer to recent cardiac catheterization for coronary assessment. Severe coronary calcifications noted.   Cardiac Morphology:   Right Atrium: Right atrial size is within normal limits.   Right Ventricle:  The right ventricular cavity is within normal limits.   Left Atrium: Left atrial size is dilated in size with no left atrial appendage filling defect.   Left Ventricle: The ventricular cavity size is within normal limits.   Pulmonary arteries: Dilated pulmonary artery suggestive of pulmonary hypertension.   Pulmonary veins: Normal pulmonary venous drainage.   Pericardium: Normal thickness with no significant effusion or calcium present.   Mitral Valve: The mitral valve is normal structure without significant calcification.   Extra-cardiac findings: See attached radiology report for non-cardiac structures.    IMPRESSION: 1. Annular measurements favor a 26 mm S3 (524 mm2). Could consider a 29 mm Evolut Pro.   2. No significant annular or subannular calcifications.   3. Sufficient coronary to annulus distance.   4. Optimal Fluoroscopic Angle for Delivery: LAO 4 CRA 4   [place] T. Audie Box, MD     Electronically Signed   By: Eleonore Chiquito M.D.   On: 02/09/2022 17:35    Addended by Geralynn Rile, MD on 02/09/2022  5:37 PM    Study Result   Narrative & Impression  EXAM: OVER-READ INTERPRETATION  CT CHEST   The following report is a limited chest CT over-read performed by radiologist Dr. Yetta Glassman of [facility], [place] on 02/06/2022. This over-read does not include interpretation of cardiac or coronary anatomy or pathology. The cardiac CTA interpretation by the cardiologist is attached.   COMPARISON:  None Available.   FINDINGS: Extracardiac findings will be described separately under dictation for contemporaneously obtained CTA chest, abdomen and pelvis.   IMPRESSION: Please see separate dictation for contemporaneously obtained CTA chest, abdomen and pelvis dated 02/06/2022 for full description of relevant extracardiac findings.   Electronically Signed: By: Yetta Glassman M.D. On: 02/06/2022 11:16        Narrative & Impression  CLINICAL DATA:  Aortic valve replacement (TAVR), pre-op eval   EXAM: CT ANGIOGRAPHY CHEST, ABDOMEN AND PELVIS   TECHNIQUE: Non-contrast CT of the chest was initially obtained.   Multidetector CT imaging through the chest, abdomen and pelvis was performed using the standard protocol during bolus administration of intravenous contrast. Multiplanar reconstructed images and MIPs were obtained and reviewed to evaluate the vascular anatomy.   RADIATION DOSE REDUCTION: This exam was performed according to the departmental dose-optimization program which includes automated exposure control, adjustment of the mA and/or kV  according to patient size and/or use of iterative reconstruction technique.   CONTRAST:  131m OMNIPAQUE IOHEXOL 350 MG/ML SOLN   COMPARISON:  None Available.   FINDINGS: CTA CHEST FINDINGS   Cardiovascular: Normal heart size. No pericardial effusion. Severe three-vessel coronary artery calcifications. Aortic valve thickening and calcifications. Mild atherosclerotic disease of the thoracic aorta.   Mediastinum/Nodes: Small hiatal hernia. Thyroid is unremarkable. No pathologically enlarged lymph nodes seen in the chest.   Lungs/Pleura: Central airways are patent. No consolidation, pleural effusion or pneumothorax.   Musculoskeletal: No chest wall abnormality. No acute or significant osseous findings.   CTA ABDOMEN AND PELVIS FINDINGS   Hepatobiliary: Small scattered low-attenuation liver lesions which are likely simple cysts. Gallbladder is unremarkable. No biliary ductal dilation.   Pancreas: Unremarkable. No pancreatic ductal dilatation or surrounding inflammatory changes.   Spleen: Normal in size without focal abnormality.   Adrenals/Urinary Tract: Bilateral adrenal glands are unremarkable. Exophytic simple cyst of the right kidney and lower pole right renal cyst, additional small low-attenuation renal lesions which are too small to completely characterize, no further follow-up imaging is recommended for these  lesions. Bladder is decompressed and thick-walled.   Stomach/Bowel: Stomach is within normal limits. Diverticulosis. No evidence of bowel wall thickening, distention, or inflammatory changes.   Vascular/lymphatic: No pathologically enlarged lymph nodes seen in the abdomen or pelvis. Mild atherosclerotic disease of the abdominal aorta consisting of calcified and noncalcified plaque. Moderate narrowing at the origin of the celiac and mild narrowing at the origins of the celiac, SMA and IMA due to calcified plaque. Mild narrowing at the origin of the right  renal artery and severe narrowing at the origin of the left renal artery due to calcified plaque.   Reproductive: Mild prostatomegaly.   Other: Small fat containing right inguinal hernia. No abdominopelvic ascites.   Musculoskeletal: No acute or significant osseous findings.   VASCULAR MEASUREMENTS PERTINENT TO TAVR:   AORTA:   Minimal Aortic Diameter-16.1 mm   Severity of Aortic Calcification-mild   RIGHT PELVIS:   Right Common Iliac Artery -   Minimal Diameter-7.8 mm   Tortuosity-moderate   Calcification-mild   Right External Iliac Artery -   Minimal Diameter-9.3 mm   Tortuosity-mild   Calcification-none   Right Common Femoral Artery -   Minimal Diameter-8.3 mm   Tortuosity-none   Calcification-mild   LEFT PELVIS:   Left Common Iliac Artery -   Minimal Diameter-8.3 mm   Tortuosity-severe   Calcification-mild   Left External Iliac Artery -   Minimal Diameter-9.4 mm   Tortuosity-none   Calcification-none   Left Common Femoral Artery -   Minimal Diameter-10.1 mm   Tortuosity-none   Calcification-none   Review of the MIP images confirms the above findings.   IMPRESSION: 1. Vascular findings and measurements pertinent to potential TAVR procedure, as detailed above. 2. Severe thickening calcification of the aortic valve, compatible with reported clinical history of severe aortic stenosis. 3. Mild aortoiliac atherosclerosis. Three vessel coronary artery disease. 4. Mild bladder wall thickening which can be seen in the setting of cystitis or outlet obstruction related to prostatomegaly.     Electronically Signed   By: Yetta Glassman M.D.   On: 02/06/2022 12:06      Impression:   This 81 year old gentleman has stage D1, severe, symptomatic aortic stenosis with New York Heart Association class III symptoms of exertional fatigue and shortness of breath consistent with chronic diastolic congestive heart failure.  I have personally  reviewed his echocardiogram, cardiac catheterization, and CTA studies.  His echo shows a severely calcified and thickened aortic valve with restricted leaflet mobility.  The mean gradient has increased to 40 mmHg with a valve area of 0.70 cm consistent with severe aortic stenosis.  Cardiac catheterization shows a high-grade stenosis in a small diagonal branch that is unchanged from his prior study.  Otherwise there is no significant coronary disease.  The mean transvalvular gradient was measured at 51 mmHg with a valve area of 0.44 cm.  Right heart pressures were normal with preserved cardiac output.  I agree that aortic valve replacement is indicated in this patient with rapidly progressive symptoms.  Given his advanced age I think that transcatheter aortic valve replacement would be the best treatment option for him.  His gated cardiac CTA shows anatomy suitable for TAVR using a SAPIEN 3 valve.  His abdominal and pelvic CTA shows adequate pelvic vascular anatomy to allow transfemoral insertion.   The patient and his wife were counseled at length regarding treatment alternatives for management of severe symptomatic aortic stenosis. The risks and benefits of surgical intervention has been discussed in detail. Long-term  prognosis with medical therapy was discussed. Alternative approaches such as conventional surgical aortic valve replacement, transcatheter aortic valve replacement, and palliative medical therapy were compared and contrasted at length. This discussion was placed in the context of the patient's own specific clinical presentation and past medical history. All of their questions have been addressed.    Following the decision to proceed with transcatheter aortic valve replacement, a discussion was held regarding what types of management strategies would be attempted intraoperatively in the event of life-threatening complications, including whether or not the patient would be considered a candidate  for the use of cardiopulmonary bypass and/or conversion to open sternotomy for attempted surgical intervention.  He is in good shape for his age and I think he would be a candidate for emergent sternotomy to manage any intraoperative complications. The patient is aware of the fact that transient use of cardiopulmonary bypass may be necessary. The patient has been advised of a variety of complications that might develop including but not limited to risks of death, stroke, paravalvular leak, aortic dissection or other major vascular complications, aortic annulus rupture, device embolization, cardiac rupture or perforation, mitral regurgitation, acute myocardial infarction, arrhythmia, heart block or bradycardia requiring permanent pacemaker placement, congestive heart failure, respiratory failure, renal failure, pneumonia, infection, other late complications related to structural valve deterioration or migration, or other complications that might ultimately cause a temporary or permanent loss of functional independence or other long term morbidity. The patient provides full informed consent for the procedure as described and all questions were answered.       Plan:   Transfemoral TAVR using a SAPIEN 3 valve.        Gaye Pollack, MD

## 2022-02-25 ENCOUNTER — Other Ambulatory Visit: Payer: Self-pay | Admitting: Cardiology

## 2022-02-25 ENCOUNTER — Ambulatory Visit: Payer: Medicare Other | Admitting: Cardiovascular Disease

## 2022-02-25 ENCOUNTER — Inpatient Hospital Stay (HOSPITAL_COMMUNITY)
Admission: RE | Admit: 2022-02-25 | Discharge: 2022-02-26 | DRG: 267 | Disposition: A | Discharge status: Home / Self Care | Payer: Medicare Other | Attending: Internal Medicine | Admitting: Internal Medicine

## 2022-02-25 ENCOUNTER — Encounter (HOSPITAL_COMMUNITY): Payer: Self-pay | Admitting: Internal Medicine

## 2022-02-25 ENCOUNTER — Inpatient Hospital Stay (HOSPITAL_COMMUNITY): Payer: Medicare Other | Admitting: Vascular Surgery

## 2022-02-25 ENCOUNTER — Other Ambulatory Visit: Payer: Self-pay

## 2022-02-25 ENCOUNTER — Encounter (HOSPITAL_COMMUNITY)
Admission: RE | Disposition: A | Discharge status: Home / Self Care | Payer: Self-pay | Source: Home / Self Care | Attending: Internal Medicine

## 2022-02-25 ENCOUNTER — Inpatient Hospital Stay (HOSPITAL_COMMUNITY)
Admission: RE | Admit: 2022-02-25 | Discharge: 2022-02-25 | Disposition: A | Discharge status: Home / Self Care | Payer: Medicare Other | Source: Ambulatory Visit | Attending: Internal Medicine | Admitting: Internal Medicine

## 2022-02-25 ENCOUNTER — Inpatient Hospital Stay (HOSPITAL_COMMUNITY): Payer: Medicare Other | Admitting: Anesthesiology

## 2022-02-25 DIAGNOSIS — I2510 Atherosclerotic heart disease of native coronary artery without angina pectoris: Secondary | ICD-10-CM

## 2022-02-25 DIAGNOSIS — K449 Diaphragmatic hernia without obstruction or gangrene: Secondary | ICD-10-CM | POA: Diagnosis not present

## 2022-02-25 DIAGNOSIS — M1990 Unspecified osteoarthritis, unspecified site: Secondary | ICD-10-CM

## 2022-02-25 DIAGNOSIS — Z8249 Family history of ischemic heart disease and other diseases of the circulatory system: Secondary | ICD-10-CM

## 2022-02-25 DIAGNOSIS — Z803 Family history of malignant neoplasm of breast: Secondary | ICD-10-CM

## 2022-02-25 DIAGNOSIS — R001 Bradycardia, unspecified: Secondary | ICD-10-CM | POA: Diagnosis not present

## 2022-02-25 DIAGNOSIS — N401 Enlarged prostate with lower urinary tract symptoms: Secondary | ICD-10-CM | POA: Diagnosis present

## 2022-02-25 DIAGNOSIS — I110 Hypertensive heart disease with heart failure: Secondary | ICD-10-CM | POA: Diagnosis present

## 2022-02-25 DIAGNOSIS — Z006 Encounter for examination for normal comparison and control in clinical research program: Secondary | ICD-10-CM

## 2022-02-25 DIAGNOSIS — N138 Other obstructive and reflux uropathy: Secondary | ICD-10-CM | POA: Diagnosis present

## 2022-02-25 DIAGNOSIS — Z96653 Presence of artificial knee joint, bilateral: Secondary | ICD-10-CM | POA: Diagnosis present

## 2022-02-25 DIAGNOSIS — I480 Paroxysmal atrial fibrillation: Secondary | ICD-10-CM

## 2022-02-25 DIAGNOSIS — I5032 Chronic diastolic (congestive) heart failure: Secondary | ICD-10-CM | POA: Diagnosis not present

## 2022-02-25 DIAGNOSIS — I340 Nonrheumatic mitral (valve) insufficiency: Secondary | ICD-10-CM | POA: Diagnosis present

## 2022-02-25 DIAGNOSIS — Z952 Presence of prosthetic heart valve: Secondary | ICD-10-CM

## 2022-02-25 DIAGNOSIS — Z87440 Personal history of urinary (tract) infections: Secondary | ICD-10-CM

## 2022-02-25 DIAGNOSIS — Z8616 Personal history of COVID-19: Secondary | ICD-10-CM | POA: Diagnosis not present

## 2022-02-25 DIAGNOSIS — Z5181 Encounter for therapeutic drug level monitoring: Secondary | ICD-10-CM

## 2022-02-25 DIAGNOSIS — R413 Other amnesia: Secondary | ICD-10-CM | POA: Diagnosis present

## 2022-02-25 DIAGNOSIS — I350 Nonrheumatic aortic (valve) stenosis: Secondary | ICD-10-CM

## 2022-02-25 DIAGNOSIS — Z7901 Long term (current) use of anticoagulants: Secondary | ICD-10-CM

## 2022-02-25 DIAGNOSIS — Z79899 Other long term (current) drug therapy: Secondary | ICD-10-CM

## 2022-02-25 HISTORY — PX: TRANSCATHETER AORTIC VALVE REPLACEMENT, TRANSFEMORAL: SHX6400

## 2022-02-25 HISTORY — PX: INTRAOPERATIVE TRANSTHORACIC ECHOCARDIOGRAM: SHX6523

## 2022-02-25 HISTORY — DX: Presence of prosthetic heart valve: Z95.2

## 2022-02-25 LAB — POCT I-STAT, CHEM 8
BUN: 18 mg/dL (ref 8–23)
BUN: 18 mg/dL (ref 8–23)
Calcium, Ion: 1.31 mmol/L (ref 1.15–1.40)
Calcium, Ion: 1.3 mmol/L (ref 1.15–1.40)
Chloride: 106 mmol/L (ref 98–111)
Chloride: 105 mmol/L (ref 98–111)
Creatinine, Ser: 0.8 mg/dL (ref 0.61–1.24)
Creatinine, Ser: 0.8 mg/dL (ref 0.61–1.24)
Glucose, Bld: 133 mg/dL — ABNORMAL HIGH (ref 70–99)
Glucose, Bld: 102 mg/dL — ABNORMAL HIGH (ref 70–99)
HCT: 34 % — ABNORMAL LOW (ref 39.0–52.0)
HCT: 34 % — ABNORMAL LOW (ref 39.0–52.0)
Hemoglobin: 11.6 g/dL — ABNORMAL LOW (ref 13.0–17.0)
Hemoglobin: 11.6 g/dL — ABNORMAL LOW (ref 13.0–17.0)
Potassium: 4.1 mmol/L (ref 3.5–5.1)
Potassium: 4.4 mmol/L (ref 3.5–5.1)
Sodium: 139 mmol/L (ref 135–145)
Sodium: 141 mmol/L (ref 135–145)
TCO2: 24 mmol/L (ref 22–32)
TCO2: 25 mmol/L (ref 22–32)

## 2022-02-25 LAB — POCT ACTIVATED CLOTTING TIME
Activated Clotting Time: 144 seconds
Activated Clotting Time: 245 seconds
Activated Clotting Time: 120 seconds

## 2022-02-25 LAB — ECHOCARDIOGRAM LIMITED
AR max vel: 2.49 cm2
AV Area VTI: 2.95 cm2
AV Area mean vel: 1.54 cm2
AV Mean grad: 4.8 mmHg
AV Peak grad: 11.4 mmHg
Ao pk vel: 1.69 m/s
Calc EF: 58.8 %
S' Lateral: 3.72 cm
Single Plane A2C EF: 53.2 %
Single Plane A4C EF: 64.5 %

## 2022-02-25 SURGERY — IMPLANTATION, AORTIC VALVE, TRANSCATHETER, FEMORAL APPROACH
Anesthesia: Monitor Anesthesia Care

## 2022-02-25 MED ORDER — LIDOCAINE HCL (PF) 1 % IJ SOLN
INTRAMUSCULAR | Status: AC
  Filled 2022-02-25: qty 30

## 2022-02-25 MED ORDER — SODIUM CHLORIDE 0.9% FLUSH: 3.0000 mL | INTRAVENOUS | Status: DC | PRN

## 2022-02-25 MED ORDER — LACTATED RINGERS IV SOLN: INTRAVENOUS | Status: DC

## 2022-02-25 MED ORDER — CEFAZOLIN SODIUM-DEXTROSE 2-4 GM/100ML-% IV SOLN
2.0000 g | Freq: Three times a day (TID) | INTRAVENOUS | Status: AC
  Administered 2022-02-25 – 2022-02-26 (×2): 2 g via INTRAVENOUS
  Filled 2022-02-25 (×2): qty 100

## 2022-02-25 MED ORDER — ACETAMINOPHEN 325 MG PO TABS
650.0000 mg | ORAL_TABLET | Freq: Four times a day (QID) | ORAL | Status: DC | PRN
  Administered 2022-02-25: 650 mg via ORAL
  Filled 2022-02-25: qty 2

## 2022-02-25 MED ORDER — DOXYCYCLINE HYCLATE 100 MG PO TABS
100.0000 mg | ORAL_TABLET | Freq: Every day | ORAL | Status: DC
  Administered 2022-02-25 – 2022-02-26 (×2): 100 mg via ORAL
  Filled 2022-02-25 (×2): qty 1

## 2022-02-25 MED ORDER — SODIUM CHLORIDE 0.9 % IV SOLN: 250.0000 mL | INTRAVENOUS | Status: DC | PRN

## 2022-02-25 MED ORDER — HEPARIN SODIUM (PORCINE) 1000 UNIT/ML IJ SOLN
INTRAMUSCULAR | Status: DC | PRN
  Administered 2022-02-25: 13000 [IU] via INTRAVENOUS

## 2022-02-25 MED ORDER — CHLORHEXIDINE GLUCONATE 4 % EX LIQD
60.0000 mL | Freq: Once | CUTANEOUS | Status: DC
  Filled 2022-02-25: qty 60

## 2022-02-25 MED ORDER — SODIUM CHLORIDE 0.9 % IV SOLN: INTRAVENOUS | Status: AC

## 2022-02-25 MED ORDER — HEPARIN (PORCINE) IN NACL 1000-0.9 UT/500ML-% IV SOLN
INTRAVENOUS | Status: AC
  Filled 2022-02-25: qty 500

## 2022-02-25 MED ORDER — FENTANYL CITRATE (PF) 100 MCG/2ML IJ SOLN
INTRAMUSCULAR | Status: AC
  Filled 2022-02-25: qty 2

## 2022-02-25 MED ORDER — PROPOFOL 500 MG/50ML IV EMUL
INTRAVENOUS | Status: DC | PRN
  Administered 2022-02-25: 20 ug/kg/min via INTRAVENOUS

## 2022-02-25 MED ORDER — CHLORHEXIDINE GLUCONATE 0.12 % MT SOLN
15.0000 mL | Freq: Once | OROMUCOSAL | Status: DC
  Filled 2022-02-25: qty 15

## 2022-02-25 MED ORDER — FENTANYL CITRATE (PF) 100 MCG/2ML IJ SOLN
INTRAMUSCULAR | Status: DC | PRN
  Administered 2022-02-25: 50 ug via INTRAVENOUS

## 2022-02-25 MED ORDER — ONDANSETRON HCL 4 MG/2ML IJ SOLN: 4.0000 mg | Freq: Four times a day (QID) | INTRAMUSCULAR | Status: DC | PRN

## 2022-02-25 MED ORDER — LIDOCAINE HCL 1 % IJ SOLN: INTRAMUSCULAR | Status: DC | PRN

## 2022-02-25 MED ORDER — SODIUM CHLORIDE 0.9 % IV SOLN: INTRAVENOUS | Status: DC

## 2022-02-25 MED ORDER — SODIUM CHLORIDE 0.9% FLUSH
3.0000 mL | Freq: Two times a day (BID) | INTRAVENOUS | Status: DC
  Administered 2022-02-25: 3 mL via INTRAVENOUS

## 2022-02-25 MED ORDER — HEPARIN (PORCINE) IN NACL 1000-0.9 UT/500ML-% IV SOLN
INTRAVENOUS | Status: AC
  Filled 2022-02-25: qty 1000

## 2022-02-25 MED ORDER — ONDANSETRON HCL 4 MG/2ML IJ SOLN
INTRAMUSCULAR | Status: DC | PRN
  Administered 2022-02-25: 4 mg via INTRAVENOUS

## 2022-02-25 MED ORDER — FINASTERIDE 5 MG PO TABS
5.0000 mg | ORAL_TABLET | Freq: Every day | ORAL | Status: DC
  Administered 2022-02-25: 5 mg via ORAL
  Filled 2022-02-25: qty 1

## 2022-02-25 MED ORDER — EPHEDRINE SULFATE-NACL 50-0.9 MG/10ML-% IV SOSY
PREFILLED_SYRINGE | INTRAVENOUS | Status: DC | PRN
  Administered 2022-02-25: 5 mg via INTRAVENOUS

## 2022-02-25 MED ORDER — MORPHINE SULFATE (PF) 2 MG/ML IV SOLN: 1.0000 mg | INTRAVENOUS | Status: DC | PRN

## 2022-02-25 MED ORDER — GLYCOPYRROLATE 0.2 MG/ML IJ SOLN
INTRAMUSCULAR | Status: DC | PRN
  Administered 2022-02-25 (×2): .1 mg via INTRAVENOUS

## 2022-02-25 MED ORDER — DEXMEDETOMIDINE (PRECEDEX) IN NS 20 MCG/5ML (4 MCG/ML) IV SYRINGE
PREFILLED_SYRINGE | INTRAVENOUS | Status: DC | PRN
  Administered 2022-02-25: 42.32 ug via INTRAVENOUS

## 2022-02-25 MED ORDER — HEPARIN (PORCINE) IN NACL 1000-0.9 UT/500ML-% IV SOLN
INTRAVENOUS | Status: DC | PRN
  Administered 2022-02-25 (×2): 500 mL

## 2022-02-25 MED ORDER — OXYCODONE HCL 5 MG PO TABS: 5.0000 mg | ORAL_TABLET | ORAL | Status: DC | PRN

## 2022-02-25 MED ORDER — ALFUZOSIN HCL ER 10 MG PO TB24
10.0000 mg | ORAL_TABLET | Freq: Every day | ORAL | Status: DC
  Administered 2022-02-25: 10 mg via ORAL
  Filled 2022-02-25: qty 1

## 2022-02-25 MED ORDER — APIXABAN 5 MG PO TABS
5.0000 mg | ORAL_TABLET | Freq: Two times a day (BID) | ORAL | Status: DC
  Administered 2022-02-26: 5 mg via ORAL
  Filled 2022-02-25: qty 1

## 2022-02-25 MED ORDER — ACETAMINOPHEN 650 MG RE SUPP: 650.0000 mg | Freq: Four times a day (QID) | RECTAL | Status: DC | PRN

## 2022-02-25 MED ORDER — TRAMADOL HCL 50 MG PO TABS: 50.0000 mg | ORAL_TABLET | ORAL | Status: DC | PRN

## 2022-02-25 MED ORDER — PROTAMINE SULFATE 10 MG/ML IV SOLN
INTRAVENOUS | Status: DC | PRN
  Administered 2022-02-25: 130 mg via INTRAVENOUS

## 2022-02-25 MED ORDER — NITROGLYCERIN IN D5W 200-5 MCG/ML-% IV SOLN: 0.0000 ug/min | INTRAVENOUS | Status: DC

## 2022-02-25 MED ORDER — CHLORHEXIDINE GLUCONATE 0.12 % MT SOLN
15.0000 mL | Freq: Once | OROMUCOSAL | Status: AC
  Administered 2022-02-25: 15 mL via OROMUCOSAL
  Filled 2022-02-25 (×2): qty 15

## 2022-02-25 MED ORDER — LIDOCAINE HCL (PF) 1 % IJ SOLN
INTRAMUSCULAR | Status: DC | PRN
  Administered 2022-02-25 (×2): 15 mL

## 2022-02-25 MED ORDER — IOHEXOL 350 MG/ML SOLN
INTRAVENOUS | Status: DC | PRN
  Administered 2022-02-25: 90 mL

## 2022-02-25 MED ORDER — CHLORHEXIDINE GLUCONATE 4 % EX LIQD
60.0000 mL | Freq: Once | CUTANEOUS | Status: DC
  Administered 2022-02-25: 4 via TOPICAL
  Filled 2022-02-25: qty 60

## 2022-02-25 MED ORDER — SODIUM CHLORIDE 0.9 % IV SOLN: 250.0000 mL | INTRAVENOUS | Status: DC

## 2022-02-25 MED ORDER — CHLORHEXIDINE GLUCONATE 4 % EX LIQD
30.0000 mL | CUTANEOUS | Status: DC
  Filled 2022-02-25: qty 30

## 2022-02-25 MED ORDER — ATORVASTATIN CALCIUM 10 MG PO TABS
10.0000 mg | ORAL_TABLET | ORAL | Status: DC
  Administered 2022-02-26: 10 mg via ORAL
  Filled 2022-02-25: qty 1

## 2022-02-25 SURGICAL SUPPLY — 40 items
ADH SKN CLS APL DERMABOND .7 (GAUZE/BANDAGES/DRESSINGS) ×1
BAG SNAP BAND KOVER 36X36 (MISCELLANEOUS) ×7 IMPLANT
BLANKET WARM UNDERBOD FULL ACC (MISCELLANEOUS) ×3 IMPLANT
CABLE SURGICAL S-101-97-12 (CABLE) ×1 IMPLANT
CATH 26 ULTRA DELIVERY (CATHETERS) ×1 IMPLANT
CATH DIAG 6FR PIGTAIL ANGLED (CATHETERS) ×2 IMPLANT
CATH DIAG EXPO 6F AL1 (CATHETERS) IMPLANT
CATH DIAG EXPO 6F VENT PIG 145 (CATHETERS) ×6 IMPLANT
CATH INFINITI 6F AL1 (CATHETERS) ×1 IMPLANT
CATH INFINITI 6F AL2 (CATHETERS) IMPLANT
CLOSURE MYNX CONTROL 6F/7F (Vascular Products) ×1 IMPLANT
CLOSURE PERCLOSE PROSTYLE (VASCULAR PRODUCTS) ×2 IMPLANT
CRIMPER (MISCELLANEOUS) ×1 IMPLANT
DERMABOND ADVANCED (GAUZE/BANDAGES/DRESSINGS) ×1
DERMABOND ADVANCED .7 DNX12 (GAUZE/BANDAGES/DRESSINGS) ×2 IMPLANT
DEVICE CLOSURE PERCLS PRGLD 6F (VASCULAR PRODUCTS) ×4 IMPLANT
DEVICE INFLATION ATRION QL2530 (MISCELLANEOUS) ×1 IMPLANT
GLOVE ECLIPSE 7.0 STRL STRAW (GLOVE) ×3 IMPLANT
GOWN STRL REUS W/ TWL XL LVL3 (GOWN DISPOSABLE) ×2 IMPLANT
GOWN STRL REUS W/TWL LRG LVL3 (GOWN DISPOSABLE) ×3 IMPLANT
GOWN STRL REUS W/TWL XL LVL3 (GOWN DISPOSABLE) ×2
GUIDEWIRE SAFE TJ AMPLATZ EXST (WIRE) ×3 IMPLANT
KIT HEART LEFT (KITS) ×3 IMPLANT
KIT SAPIAN 3 ULTRA RESILIA 26 (Valve) ×1 IMPLANT
PACK CARDIAC CATHETERIZATION (CUSTOM PROCEDURE TRAY) ×3 IMPLANT
PAD ELECT DEFIB RADIOL ZOLL (MISCELLANEOUS) ×3 IMPLANT
PERCLOSE PROGLIDE 6F (VASCULAR PRODUCTS) ×4
SHEATH BRITE TIP 7FR 35CM (SHEATH) ×1 IMPLANT
SHEATH INTRODUCER SET 20-26 (SHEATH) ×1 IMPLANT
SHEATH PINNACLE 6F 10CM (SHEATH) ×7 IMPLANT
SHEATH PINNACLE 8F 10CM (SHEATH) ×4 IMPLANT
SHEATH PROBE COVER 6X72 (BAG) ×1 IMPLANT
STOPCOCK MORSE 400PSI 3WAY (MISCELLANEOUS) ×6 IMPLANT
TRANSDUCER W/STOPCOCK (MISCELLANEOUS) ×6 IMPLANT
WIRE AMPLATZ SS-J .035X180CM (WIRE) ×1 IMPLANT
WIRE EMERALD 3MM-J .035X150CM (WIRE) ×4 IMPLANT
WIRE EMERALD 3MM-J .035X260CM (WIRE) ×4 IMPLANT
WIRE EMERALD ST .035X260CM (WIRE) ×4 IMPLANT
WIRE MICRO SET SILHO 5FR 7 (SHEATH) ×1 IMPLANT
WIRE SAFARI SM CURVE 275 (WIRE) ×1 IMPLANT

## 2022-02-25 NOTE — Plan of Care (Signed)

## 2022-02-25 NOTE — Anesthesia Procedure Notes (Signed)
Procedure Name: MAC Date/Time: 02/25/2022 9:15 AM  Performed by: Inda Coke, CRNAPre-anesthesia Checklist: Patient identified, Emergency Drugs available, Suction available, Timeout performed and Patient being monitored Patient Re-evaluated:Patient Re-evaluated prior to induction Oxygen Delivery Method: Simple face mask Induction Type: IV induction Dental Injury: Teeth and Oropharynx as per pre-operative assessment

## 2022-02-25 NOTE — Transfer of Care (Signed)
Immediate Anesthesia Transfer of Care Note  Patient: Gerald Spears  Procedure(s) Performed: Transcatheter Aortic Valve Replacement, Transfemoral INTRAOPERATIVE TRANSTHORACIC ECHOCARDIOGRAM  Patient Location: PACU and Cath Lab  Anesthesia Type:MAC  Level of Consciousness: awake, alert  and oriented  Airway & Oxygen Therapy: Patient Spontanous Breathing  Post-op Assessment: Report given to RN and Post -op Vital signs reviewed and stable  Post vital signs: Reviewed and stable  Last Vitals:  Vitals Value Taken Time  BP 134/74 02/25/22 1100  Temp    Pulse 56 02/25/22 1100  Resp 13 02/25/22 1101  SpO2 99 % 02/25/22 1101  Vitals shown include unvalidated device data.  Last Pain:  Vitals:   02/25/22 0906  TempSrc:   PainSc: 0-No pain         Complications: There were no known notable events for this encounter.

## 2022-02-25 NOTE — Progress Notes (Signed)
Mobility Specialist: Progress Note   02/25/22 1739  Mobility  Activity Ambulated with assistance in hallway  Level of Assistance Contact guard assist, steadying assist  Assistive Device Other (Comment) (IV pole)  Distance Ambulated (ft) 360 ft  Activity Response Tolerated well  $Mobility charge 1 Mobility   Pre-Mobility: 54 HR Post-Mobility: 50 HR, 127/68 (86) BP, 100% SpO2  Received pt in bed having no complaints and agreeable to mobility. Pt was asymptomatic throughout ambulation and returned to room w/o fault. Left in bed w/ call bell in reach and all needs met.  [facility] Tamon Parkerson Mobility Specialist Mobility Specialist [address]: [phone]

## 2022-02-25 NOTE — Op Note (Signed)
[facility]   MULTIDISCIPLINARY HEART VALVE TEAM   TAVR OPERATIVE NOTE   Date of Procedure:  02/25/2022  Preoperative Diagnosis: Severe Aortic Stenosis   Postoperative Diagnosis: Same   Procedure:   Transcatheter Aortic Valve Replacement - Percutaneous Right Transfemoral Approach  Edwards Sapien 3 Ultra Resilia THV (size 26 mm, model # 9755RSL, serial # [id number])   Co-Surgeons:  Gaye Pollack, MD and Lenna Sciara, MD   Anesthesiologist:  Adele Barthel, MD  Echocardiographer:  Sanda Klein, MD  Pre-operative Echo Findings: Severe aortic stenosis Normal left ventricular systolic function  Post-operative Echo Findings: No paravalvular leak Normal left ventricular systolic function   BRIEF CLINICAL NOTE AND INDICATIONS FOR SURGERY  This 81 year old gentleman has stage D1, severe, symptomatic aortic stenosis with New York Heart Association class III symptoms of exertional fatigue and shortness of breath consistent with chronic diastolic congestive heart failure.  I have personally reviewed his echocardiogram, cardiac catheterization, and CTA studies.  His echo shows a severely calcified and thickened aortic valve with restricted leaflet mobility.  The mean gradient has increased to 40 mmHg with a valve area of 0.70 cm consistent with severe aortic stenosis.  Cardiac catheterization shows a high-grade stenosis in a small diagonal branch that is unchanged from his prior study.  Otherwise there is no significant coronary disease.  The mean transvalvular gradient was measured at 51 mmHg with a valve area of 0.44 cm.  Right heart pressures were normal with preserved cardiac output.  I agree that aortic valve replacement is indicated in this patient with rapidly progressive symptoms.  Given his advanced age I think that transcatheter aortic valve replacement would be the best treatment option for him.  His gated cardiac CTA shows anatomy suitable for TAVR using a SAPIEN  3 valve.  His abdominal and pelvic CTA shows adequate pelvic vascular anatomy to allow transfemoral insertion.   The patient and his wife were counseled at length regarding treatment alternatives for management of severe symptomatic aortic stenosis. The risks and benefits of surgical intervention has been discussed in detail. Long-term prognosis with medical therapy was discussed. Alternative approaches such as conventional surgical aortic valve replacement, transcatheter aortic valve replacement, and palliative medical therapy were compared and contrasted at length. This discussion was placed in the context of the patient's own specific clinical presentation and past medical history. All of their questions have been addressed.    Following the decision to proceed with transcatheter aortic valve replacement, a discussion was held regarding what types of management strategies would be attempted intraoperatively in the event of life-threatening complications, including whether or not the patient would be considered a candidate for the use of cardiopulmonary bypass and/or conversion to open sternotomy for attempted surgical intervention.  He is in good shape for his age and I think he would be a candidate for emergent sternotomy to manage any intraoperative complications. The patient is aware of the fact that transient use of cardiopulmonary bypass may be necessary. The patient has been advised of a variety of complications that might develop including but not limited to risks of death, stroke, paravalvular leak, aortic dissection or other major vascular complications, aortic annulus rupture, device embolization, cardiac rupture or perforation, mitral regurgitation, acute myocardial infarction, arrhythmia, heart block or bradycardia requiring permanent pacemaker placement, congestive heart failure, respiratory failure, renal failure, pneumonia, infection, other late complications related to structural valve  deterioration or migration, or other complications that might ultimately cause a temporary or permanent loss of functional independence  or other long term morbidity. The patient provides full informed consent for the procedure as described and all questions were answered.     DETAILS OF THE OPERATIVE PROCEDURE  PREPARATION:    The patient was brought to the operating room on the above mentioned date and appropriate monitoring was established by the anesthesia team. The patient was placed in the supine position on the operating table.  Intravenous antibiotics were administered. The patient was monitored closely throughout the procedure under conscious sedation.    Baseline transthoracic echocardiogram was performed. The patient's abdomen and both groins were prepped and draped in a sterile manner. A time out procedure was performed.   PERIPHERAL ACCESS:    Using the modified Seldinger technique, femoral arterial access was obtained with placement of 6 Fr sheath on the left side.  A pigtail diagnostic catheter was passed through the left arterial sheath under fluoroscopic guidance into the aortic root.  A 6Fr sheath was placed in the right femoral vein for access.  Aortic root angiography was performed in order to determine the optimal angiographic angle for valve deployment.   TRANSFEMORAL ACCESS:   Percutaneous transfemoral access and sheath placement was performed using ultrasound guidance.  The right common femoral artery was cannulated using a micropuncture needle and appropriate location was verified using hand injection angiogram.  A pair of Abbott Perclose percutaneous closure devices were placed and a 6 French sheath replaced into the femoral artery.  The patient was heparinized systemically and ACT verified > 250 seconds.    A 14 Fr transfemoral E-sheath was introduced into the right common femoral artery after progressively dilating over an Amplatz superstiff wire. An AL-1 catheter was  used to direct a straight-tip exchange length wire across the native aortic valve into the left ventricle. This was exchanged out for a pigtail catheter and position was confirmed in the LV apex. Simultaneous LV and Ao pressures were recorded.  The pigtail catheter was exchanged for a Safari wire in the LV apex. Direct LV pacing threshold was determined through the [facility] wire and was satisfactory.   BALLOON AORTIC VALVULOPLASTY:   Not performed   TRANSCATHETER HEART VALVE DEPLOYMENT:   An Edwards Sapien 3 Ultra transcatheter heart valve (size 26 mm) was prepared and crimped per manufacturer's guidelines, and the proper orientation of the valve is confirmed on the [employer] delivery system. The valve was advanced through the introducer sheath using normal technique until in an appropriate position in the abdominal aorta beyond the sheath tip. The balloon was then retracted and using the fine-tuning wheel was centered on the valve. The valve was then advanced across the aortic arch using appropriate flexion of the catheter. The valve was carefully positioned across the aortic valve annulus. The Commander catheter was retracted using normal technique. Once final position of the valve has been confirmed by angiographic assessment, the valve is deployed during rapid ventricular pacing to maintain systolic blood pressure < 50 mmHg and pulse pressure < 10 mmHg. The balloon inflation is held for >3 seconds after reaching full deployment volume. Once the balloon has fully deflated the balloon is retracted into the ascending aorta and valve function is assessed using echocardiography. There is felt to be no paravalvular leak and no central aortic insufficiency.  The patient's hemodynamic recovery following valve deployment is good.  The deployment balloon and guidewire are both removed.    PROCEDURE COMPLETION:   The sheath was removed and femoral artery closure performed.  Protamine was administered  once  femoral arterial repair was complete. The pigtail catheter and femoral sheaths were removed with manual pressure used for venous hemostasis.  A Mynx femoral closure device was utilized following removal of the diagnostic sheath in the left femoral artery.  The patient tolerated the procedure well and is transported to the cath lab recovery area in stable condition. There were no immediate intraoperative complications. All sponge instrument and needle counts are verified correct at completion of the operation.   No blood products were administered during the operation.  The patient received a total of 90 mL of intravenous contrast during the procedure.   Gaye Pollack, MD 02/25/2022

## 2022-02-25 NOTE — Op Note (Signed)
[facility]  TAVR OPERATIVE NOTE   Date of Procedure:  02/25/2022  Preoperative Diagnosis: Severe Aortic Stenosis   Postoperative Diagnosis: Same   Procedure:   Transcatheter Aortic Valve Replacement - Transfemoral Approach  Edwards Sapien 3 Resilia THV (size 26 mm, model # 9755RLS, serial #[id number])   Co-Surgeons:   Gaye Pollack, MD and Lenna Sciara, MD Anesthesiologist:  Adele Barthel, MD  Echocardiographer:  Sanda Klein, MD  Pre-operative Echo Findings: Severe aortic stenosis Normal left ventricular systolic function  Post-operative Echo Findings: No paravalvular leak Normal left ventricular systolic function  Left Heart Catheterization Findings: Left ventricular end-diastolic pressure of 9 mmHg   BRIEF CLINICAL NOTE AND INDICATIONS FOR SURGERY  The patient is an 81 year old male with a history of paroxysmal atrial fibrillation, hypertension, moderate obstructive coronary artery disease, and severe symptomatic aortic stenosis who was referred for treatment recommendations regarding his symptomatic aortic valvular disease.  During the course of the patient's preoperative work up they have been evaluated comprehensively by a multidisciplinary team of specialists coordinated through the [facility] in the [facility].  They have been demonstrated to suffer from symptomatic severe aortic stenosis as noted above. The patient has been counseled extensively as to the relative risks and benefits of all options for the treatment of severe aortic stenosis including long term medical therapy, conventional surgery for aortic valve replacement, and transcatheter aortic valve replacement.  The patient has been independently evaluated by Dr. Cyndia Bent with CT surgery and they are felt to be at high risk for conventional surgical aortic valve replacement. The surgeon indicated the patient would be a poor candidate for  conventional surgery. Based upon review of all of the patient's preoperative diagnostic tests they are felt to be candidate for transcatheter aortic valve replacement using the transfemoral approach as an alternative to high risk conventional surgery.    Following the decision to proceed with transcatheter aortic valve replacement, a discussion has been held regarding what types of management strategies would be attempted intraoperatively in the event of life-threatening complications, including whether or not the patient would be considered a candidate for the use of cardiopulmonary bypass and/or conversion to open sternotomy for attempted surgical intervention.  The patient has been advised of a variety of complications that might develop peculiar to this approach including but not limited to risks of death, stroke, paravalvular leak, aortic dissection or other major vascular complications, aortic annulus rupture, device embolization, cardiac rupture or perforation, acute myocardial infarction, arrhythmia, heart block or bradycardia requiring permanent pacemaker placement, congestive heart failure, respiratory failure, renal failure, pneumonia, infection, other late complications related to structural valve deterioration or migration, or other complications that might ultimately cause a temporary or permanent loss of functional independence or other long term morbidity.  The patient provides full informed consent for the procedure as described and all questions were answered preoperatively.    DETAILS OF THE OPERATIVE PROCEDURE  PREPARATION:   The patient is brought to the operating room on the above mentioned date and central monitoring was established by the anesthesia team including placement of a radial arterial line. The patient is placed in the supine position on the operating table.  Intravenous antibiotics are administered. Conscious sedation is used.   Baseline transthoracic echocardiogram was  performed. The patient's chest, abdomen, both groins, and both lower extremities are prepared and draped in a sterile manner. A time out procedure is performed.   PERIPHERAL ACCESS:   Using the  modified Seldinger technique, femoral arterial and venous access were obtained with placement of a 6 Fr sheath in the artery and a 7 Fr sheath in the vein on the left side using u/s guidance.  A pigtail diagnostic catheter was passed through the femoral arterial sheath under fluoroscopic guidance into the aortic root.  Aortic root angiography was performed in order to determine the optimal angiographic angle for valve deployment.  TRANSFEMORAL ACCESS:  A micropuncture kit was used to gain access to the right femoral artery using u/s guidance. Position confirmed with angiography. Pre-closure with double ProGlide closure devices. The patient was heparinized systemically and ACT verified > 250 seconds.    A 14 Fr transfemoral E-sheath was introduced into the right femoral artery after progressively dilating over an Amplatz superstiff wire. An AL-1 catheter was used to direct a straight-tip exchange length wire across the native aortic valve into the left ventricle. This was exchanged out for a pigtail catheter and position was confirmed in the LV apex. Simultaneous left ventricular, aortic, and left ventricular end-diastolic pressures were recorded.  The pigtail catheter was then exchanged for an Safari wire in the LV apex.  Direct LV pacing thresholds were assessed and found to be adequate.   TRANSCATHETER HEART VALVE DEPLOYMENT:  An Edwards Sapien 3 THV (size 26 mm) was prepared and crimped per manufacturer's guidelines, and the proper orientation of the valve is confirmed on the [employer] delivery system. The valve was advanced through the introducer sheath using normal technique until in an appropriate position in the abdominal aorta beyond the sheath tip. The balloon was then retracted and using the  fine-tuning wheel was centered on the valve. The valve was then advanced across the aortic arch using appropriate flexion of the catheter. The valve was carefully positioned across the aortic valve annulus. The Commander catheter was retracted using normal technique. Once final position of the valve has been confirmed by angiographic assessment, the valve is deployed while temporarily holding ventilation and during rapid ventricular pacing to maintain systolic blood pressure < 50 mmHg and pulse pressure < 10 mmHg. The balloon inflation is held for >3 seconds after reaching full deployment volume. Once the balloon has fully deflated the balloon is retracted into the ascending aorta and valve function is assessed using TTE. There is felt to be no paravalvular leak and no central aortic insufficiency.  The patient's hemodynamic recovery following valve deployment is good.  The deployment balloon and guidewire are both removed. Echo demostrated acceptable post-procedural gradients, stable mitral valve function, and no AI.   PROCEDURE COMPLETION:  The sheath was then removed and closure devices were completed. Protamine was administered once femoral arterial repair was complete. The temporary pacemaker, pigtail catheters and femoral sheaths were removed with  a Mynx closure device placed in the artery and manual pressure used for venous hemostasis.    The patient tolerated the procedure well and is transported to the surgical intensive care in stable condition. There were no immediate intraoperative complications. All sponge instrument and needle counts are verified correct at completion of the operation.   No blood products were administered during the operation.  The patient received a total of 90 mL of intravenous contrast during the procedure.  Early Osmond MD 02/25/2022 12:46 PM

## 2022-02-25 NOTE — Anesthesia Preprocedure Evaluation (Addendum)
Anesthesia Evaluation  Patient identified by MRN, date of birth, ID band Patient awake    Reviewed: Allergy & Precautions, NPO status , Patient's Chart, lab work & pertinent test results  Airway Mallampati: II       Dental  (+) Upper Dentures, Missing   Pulmonary neg pulmonary ROS,    Pulmonary exam normal breath sounds clear to auscultation       Cardiovascular + CAD  + dysrhythmias + Valvular Problems/Murmurs AS  Rhythm:Regular Rate:Bradycardia + Systolic murmurs ECHO: Left ventricular ejection fraction, by estimation, is 60 to 65%. The left ventricle has normal function. The left ventricle has no regional wall motion abnormalities. There is mild concentric left ventricular hypertrophy. Left ventricular diastolic parameters are consistent with Grade II diastolic dysfunction (pseudonormalization). Elevated left ventricular end-diastolic pressure.  Right ventricular systolic function is normal. The right ventricular size is normal. There is moderately elevated pulmonary artery systolic  pressure. The estimated right ventricular systolic pressure is 63.7 mmHg.  Left atrial size was severely dilated.  The mitral valve is degenerative. Moderate mitral valve regurgitation.  No evidence of mitral stenosis.  Tricuspid valve regurgitation is mild to moderate.  The aortic valve is calcified. There is severe calcifcation of the aortic valve. There is moderate thickening of the aortic valve. Aortic valve regurgitation is not visualized. Severe aortic valve stenosis.  Aortic valve mean gradient measures 40.0  mmHg. Aortic valve Vmax measures 3.92 m/s.  Aortic dilatation noted. There is mild dilatation of the ascending aorta, measuring 38 mm.  The inferior vena cava is normal in size with greater than 50% respiratory variability, suggesting right atrial pressure of 3 mmHg.    Neuro/Psych negative neurological ROS  negative psych ROS    GI/Hepatic Neg liver ROS, hiatal hernia,   Endo/Other  negative endocrine ROS  Renal/GU negative Renal ROS     Musculoskeletal  (+) Arthritis ,   Abdominal   Peds  Hematology  (+) Blood dyscrasia, ,   Anesthesia Other Findings Severe Aortic Stenosis  Reproductive/Obstetrics                           Anesthesia Physical Anesthesia Plan  ASA: 4  Anesthesia Plan: MAC   Post-op Pain Management:    Induction: Intravenous  PONV Risk Score and Plan: 1 and Ondansetron, Dexamethasone and Amisulpride  Airway Management Planned: Simple Face Mask  Additional Equipment: Arterial line  Intra-op Plan:   Post-operative Plan:   Informed Consent: I have reviewed the patients History and Physical, chart, labs and discussed the procedure including the risks, benefits and alternatives for the proposed anesthesia with the patient or authorized representative who has indicated his/her understanding and acceptance.     Dental advisory given  Plan Discussed with: CRNA  Anesthesia Plan Comments:         Anesthesia Quick Evaluation

## 2022-02-25 NOTE — Anesthesia Procedure Notes (Signed)
Arterial Line Insertion Start/End7/06/2022 8:43 AM, 02/25/2022 8:53 AM Performed by: Inda Coke, CRNA, CRNA  Patient location: Pre-op. Preanesthetic checklist: patient identified, IV checked, site marked, risks and benefits discussed, surgical consent, monitors and equipment checked, pre-op evaluation, timeout performed and anesthesia consent Lidocaine 1% used for infiltration Right, radial was placed Catheter size: 20 G Hand hygiene performed  and maximum sterile barriers used  Allen's test indicative of satisfactory collateral circulation Attempts: 1 Procedure performed without using ultrasound guided technique. Following insertion, dressing applied and Biopatch. Post procedure assessment: normal  Patient tolerated the procedure well with no immediate complications.

## 2022-02-25 NOTE — Anesthesia Postprocedure Evaluation (Signed)
Anesthesia Post Note  Patient: Gerald Spears  Procedure(s) Performed: Transcatheter Aortic Valve Replacement, Transfemoral INTRAOPERATIVE TRANSTHORACIC ECHOCARDIOGRAM     Patient location during evaluation: Cath Lab Anesthesia Type: MAC Level of consciousness: awake Pain management: pain level controlled Vital Signs Assessment: post-procedure vital signs reviewed and stable Respiratory status: spontaneous breathing, nonlabored ventilation, respiratory function stable and patient connected to nasal cannula oxygen Cardiovascular status: stable and blood pressure returned to baseline Postop Assessment: no apparent nausea or vomiting Anesthetic complications: no   There were no known notable events for this encounter.  Last Vitals:  Vitals:   02/25/22 1430 02/25/22 1500  BP: 119/66 125/64  Pulse:    Resp: 16 15  Temp:    SpO2: 90% 92%    Last Pain:  Vitals:   02/25/22 1500  TempSrc:   PainSc: 0-No pain                 Derrik Mceachern P Baylor Cortez

## 2022-02-25 NOTE — Progress Notes (Signed)
   02/25/22 1312  Vitals  Temp 98 F (36.7 C)  Temp Source Oral  BP 132/73  MAP (mmHg) 92  BP Location Right Arm  BP Method Automatic  Patient Position (if appropriate) Lying  Pulse Rate (!) 46  Pulse Rate Source Monitor  ECG Heart Rate (!) 44  Resp 16  Level of Consciousness  Level of Consciousness Alert  MEWS COLOR  MEWS Score Color Green  Oxygen Therapy  SpO2 100 %  O2 Device Room Air  Pain Assessment  Pain Scale 0-10  Pain Score 0  MEWS Score  MEWS Temp 0  MEWS Systolic 0  MEWS Pulse 1  MEWS RR 0  MEWS LOC 0  MEWS Score 1

## 2022-02-25 NOTE — Progress Notes (Signed)
Pt arrived from ..pacu..., A/ox .4..pt denies any pain, MD aware,CCMD called. CHG bath given,no further needs at this time   

## 2022-02-25 NOTE — Interval H&P Note (Signed)
History and Physical Interval Note:  02/25/2022 8:10 AM  Gerald Spears  has presented today for surgery, with the diagnosis of Severe Aortic Stenosis.  The various methods of treatment have been discussed with the patient and family. After consideration of risks, benefits and other options for treatment, the patient has consented to  Procedure(s): Transcatheter Aortic Valve Replacement, Transfemoral (N/A) INTRAOPERATIVE TRANSTHORACIC ECHOCARDIOGRAM (N/A) as a surgical intervention.  The patient's history has been reviewed, patient examined, no change in status, stable for surgery.  I have reviewed the patient's chart and labs.  Questions were answered to the patient's satisfaction.     Gaye Pollack

## 2022-02-25 NOTE — Progress Notes (Signed)
  [facility] VALVE TEAM  Patient doing well s/p TAVR. He is hemodynamically stable. Groin sites are stable. ECG with SB and no high grade block. Plan to DC arterial line and transfer to 4E. Plan for early ambulation after bedrest completed and hopeful discharge over the next 24-48 hours.   Kathyrn Drown NP-C Structural Heart Team  Pager: [phone] Phone: [phone]

## 2022-02-25 NOTE — Progress Notes (Signed)
  Echocardiogram 2D Echocardiogram has been performed.  Gerald Spears 02/25/2022, 10:32 AM

## 2022-02-25 NOTE — TOC Initial Note (Signed)
Transition of Care [facility]) - Initial/Assessment Note    Patient Details  Name: Gerald Spears MRN: [id number] Date of Birth: 1940-08-25  [facility]) CM/SW Contact:    Ninfa Meeker, RN Phone Number: 02/25/2022, 4:19 PM  Clinical Narrative:                 Transition of Care Screening Note:  Transition of Care Department [facility]) has reviewed patient and no TOC needs have been identified at this time. We will continue to monitor patient advancement through Interdisciplinary progressions. If new patient transition needs arise, please place a consult.          Patient Goals and CMS Choice        Expected Discharge Plan and Services                                                Prior Living Arrangements/Services                       Activities of Daily Living      Permission Sought/Granted                  Emotional Assessment              Admission diagnosis:  S/P TAVR (transcatheter aortic valve replacement) [Z95.2] Patient Active Problem List   Diagnosis Date Noted   PAF (paroxysmal atrial fibrillation) ([place]) 02/25/2022   Chronic anticoagulation 02/25/2022   S/P TAVR (transcatheter aortic valve replacement) 02/25/2022   Benign prostatic hyperplasia with urinary obstruction 09/09/2021   Urinary frequency 09/09/2021   Erectile dysfunction 09/09/2021   Loose right total knee arthroplasty ([place]) 02/06/2021   Abnormal nuclear stress test 09/12/2020   Severe aortic stenosis 09/21/2018   Mitral regurgitation 09/21/2018   Primary osteoarthritis of left knee 06/18/2015   Arthritis of knee 06/18/2015   Actinic keratosis 08/04/2011   Osteomyelitis of right knee region ([place]) 03/12/2011   PCP:  [name], MD Pharmacy:   [place], [place] [address] Phone: [phone] Fax: [phone]  [id number] [place] [facility] [address] [place] [address]Phone: [phone]: [phone]    Social Determinants of Health (SDOH) Interventions    Readmission Risk Interventions     No data to display

## 2022-02-25 NOTE — Discharge Instructions (Signed)

## 2022-02-26 ENCOUNTER — Other Ambulatory Visit: Payer: Self-pay | Admitting: Cardiovascular Disease

## 2022-02-26 ENCOUNTER — Inpatient Hospital Stay (HOSPITAL_COMMUNITY): Payer: Medicare Other

## 2022-02-26 ENCOUNTER — Encounter (HOSPITAL_COMMUNITY): Payer: Self-pay | Admitting: Internal Medicine

## 2022-02-26 DIAGNOSIS — I350 Nonrheumatic aortic (valve) stenosis: Principal | ICD-10-CM

## 2022-02-26 DIAGNOSIS — Z952 Presence of prosthetic heart valve: Secondary | ICD-10-CM | POA: Diagnosis not present

## 2022-02-26 LAB — CBC
HCT: 34 % — ABNORMAL LOW (ref 39.0–52.0)
Hemoglobin: 11.6 g/dL — ABNORMAL LOW (ref 13.0–17.0)
MCH: 30.2 pg (ref 26.0–34.0)
MCHC: 34.1 g/dL (ref 30.0–36.0)
MCV: 88.5 fL (ref 80.0–100.0)
Platelets: 89 10*3/uL — ABNORMAL LOW (ref 150–400)
RBC: 3.84 MIL/uL — ABNORMAL LOW (ref 4.22–5.81)
RDW: 12.9 % (ref 11.5–15.5)
WBC: 5.7 10*3/uL (ref 4.0–10.5)
nRBC: 0 % (ref 0.0–0.2)

## 2022-02-26 LAB — BASIC METABOLIC PANEL
Anion gap: 7 (ref 5–15)
BUN: 20 mg/dL (ref 8–23)
CO2: 26 mmol/L (ref 22–32)
Calcium: 8.7 mg/dL — ABNORMAL LOW (ref 8.9–10.3)
Chloride: 106 mmol/L (ref 98–111)
Creatinine, Ser: 1.03 mg/dL (ref 0.61–1.24)
GFR, Estimated: >60 mL/min (ref >60)
Glucose, Bld: 103 mg/dL — ABNORMAL HIGH (ref 70–99)
Potassium: 4.1 mmol/L (ref 3.5–5.1)
Sodium: 139 mmol/L (ref 135–145)

## 2022-02-26 LAB — ECHOCARDIOGRAM COMPLETE
AR max vel: 2.79 cm2
AV Area VTI: 2.83 cm2
AV Area mean vel: 2.96 cm2
AV Mean grad: 12.8 mmHg
AV Peak grad: 25.7 mmHg
Ao pk vel: 2.54 m/s
Area-P 1/2: 3.79 cm2
Height: 71 in
P 1/2 time: 469 msec
S' Lateral: 3.5 cm
Weight: 2931.24 oz

## 2022-02-26 LAB — MAGNESIUM: Magnesium: 2 mg/dL (ref 1.7–2.4)

## 2022-02-26 MED FILL — Heparin Sod (Porcine)-NaCl IV Soln 1000 Unit/500ML-0.9%: INTRAVENOUS | Qty: 500 | Status: AC

## 2022-02-26 NOTE — Plan of Care (Signed)
DISCHARGE NOTE HOME Gerald Spears to be discharged home per MD order. Discussed follow up appointments with the patient. Medication list explained in detail. Patient verbalized understanding.  Skin clean, dry and intact without evidence of skin break down, no evidence of skin tears noted. IV catheter discontinued intact. Site without signs and symptoms of complications. Dressing and pressure applied. Pt denies pain at the site currently. No complaints noted.  Patient free of lines, drains, and wounds.   An After Visit Summary (AVS) was printed and given to the patient. Patient escorted via wheelchair, and discharged home via private auto.  Stephan Minister, RN

## 2022-02-26 NOTE — Progress Notes (Signed)
CARDIAC REHAB PHASE I   PRE:  Rate/Rhythm: 60 Afib  BP:  Sitting: 124/64      SaO2: 99 RA  MODE:  Ambulation: 270 ft   POST:  Rate/Rhythm: 103 Afib  BP:  Sitting: 132/65    SaO2: 98 RA  Pt ambulated 240f in hallway standby assist with steady gait. Pt states improvement in his breathing, denies CP or dizziness throughout. Pt and wife educated on site care, restrictions, and exercise guidelines. Will refer to CRP II [place].  [id number] Falco RN BSN 02/26/2022 10:03 AM

## 2022-02-26 NOTE — Discharge Summary (Addendum)
[place] [facility]VE TEAM  Discharge Summary    Patient ID: Gerald Spears MRN: [id number]; DOB: 15-Sep-1940  Admit date: 02/25/2022 Discharge date: 02/26/2022  Primary Care Provider: [name], MD  Primary Cardiologist: Evalina Field, MD / Dr. Ali Lowe, MD & Dr. Cyndia Bent, MD (TAVR)  Discharge Diagnoses    Principal Problem:   Severe aortic stenosis Active Problems:   Mitral regurgitation   Benign prostatic hyperplasia with urinary obstruction   PAF (paroxysmal atrial fibrillation) (HCC)   Chronic anticoagulation   S/P TAVR (transcatheter aortic valve replacement)  Allergies No Known Allergies  Diagnostic Studies/Procedures    TAVR OPERATIVE NOTE     Date of Procedure:                02/25/2022   Preoperative Diagnosis:      Severe Aortic Stenosis    Postoperative Diagnosis:    Same    Procedure:        Transcatheter Aortic Valve Replacement - Percutaneous Right Transfemoral Approach             Edwards Sapien 3 Ultra Resilia THV (size 26 mm, model # 9755RSL, serial # [id number])              Co-Surgeons:                        Gaye Pollack, MD and Lenna Sciara, MD     Anesthesiologist:                  Adele Barthel, MD   Echocardiographer:              Sanda Klein, MD   Pre-operative Echo Findings: Severe aortic stenosis Normal left ventricular systolic function   Post-operative Echo Findings: No paravalvular leak Normal left ventricular systolic function ____________   Echo 02/26/22: Completed but pending formal read at the time of discharge   History of Present Illness     Gerald Spears is a 81 y.o. male with a history of paroxysmal atrial fibrillation on Eliquis, hypertension, CAD, and severe aortic stenosis who presented to [facility] on 02/25/22 for planned TAVR.   Gerald Spears underwent a cardiac catheterization in 08/2020 after presenting with exertional dyspnea and having an abnormal nuclear stress  test with a reversible perfusion defect in the lateral apex. Cardiac cath showed severe diagonal branch stenosis but no other obstructive epicardial CAD with recommendations for continued medical therapy. He then began having fairly rapid progression of aortic stenosis. In 2020 the mean transaortic gradient was 16 mmHg with a dimensionless index of 0.34. By 2022 this had increased to 32 mmHg with a dimensionless index of 0.35.  Most recent echocardiogram with a  peak and mean gradients of 62 and 40 mmHg, respectively, with a dimensionless index of 0.17 and calculated valve area of 0.7 cm.   Last year, the patient underwent revision of the left knee replacement.  After surgery, he had COVID-pneumonia and also began having issues with paroxysmal atrial fibrillation with rather poor progression since that time.   Updated R/LHC was performed 01/23/22 that showed severe diagonal stenosis at 60% with otherwise non-obstructive disease. Severe calcific aortic stenosis with mean transvalvular gradient of 51 mmHg, calculated aortic valve area of 0.44 cm. Normal right heart pressures with preserved cardiac output. Pre TAVR CT imaging with anatomy suitable for TAVR.   He was then seen by the multidisciplinary valve team and felt to  have severe, symptomatic aortic stenosis and to be a suitable candidate for TAVR, which was set up for 02/25/22.     Hospital Course   Severe AS: s/p successful TAVR with a 26 mm Edwards Sapien 3 UR via the TF  approach on 02/25/22. Post operative echo pending. Groin sites are stable. ECG with NSR and no high grade heart block. He will be restarted on Eliquis '5mg'$  BID today with no ASA. He has ambulated with CRI with no issues. Post procedure instructions were reviewed with understanding. SBE discussed and patient already takes Amoxicillin 2g prior to dental cleanings and procedures for prior knee replacements. We will see him back in one week and one month with repeat echocardiogram.   PAF:  Maintaining NSR/SB. Continue Eliquis '5mg'$  BID and Toprol '25mg'$  QD  HTN: Stable with no changes at this time.   CAD: Denies anginal symptoms. No ASA in the setting of Eliquis. Continue Omega 3/statin, beta blocker.   Consultants: None    The patient was seen and examined by Dr. Ali Lowe and felt to be stable and ready for discharge today, 02/26/22.  _____________  Discharge Vitals Blood pressure 111/64, pulse 65, temperature 98.2 F (36.8 C), temperature source Oral, resp. rate 20, height '5\' 11"'$  (1.803 m), weight 83.1 kg, SpO2 98 %.  Filed Weights   02/25/22 0742 02/26/22 0554  Weight: 84.6 kg 83.1 kg   General: Well developed, well nourished, NAD Neck: Negative for carotid bruits. No JVD Lungs:Clear to ausculation bilaterally. No wheezes, rales, or rhonchi. Breathing is unlabored. Cardiovascular: RRR with S1 S2. No murmurs Abdomen: Soft, non-tender, non-distended. No obvious abdominal masses. Extremities: No edema. Neuro: Alert and oriented. No focal deficits. No facial asymmetry. MAE spontaneously. Psych: Responds to questions appropriately with normal affect.    Labs & Radiologic Studies    CBC Recent Labs    02/25/22 1109 02/26/22 0243  WBC  --  5.7  HGB 11.6* 11.6*  HCT 34.0* 34.0*  MCV  --  88.5  PLT  --  89*   Basic Metabolic Panel Recent Labs    02/25/22 1109 02/26/22 0243  NA 141 139  K 4.4 4.1  CL 105 106  CO2  --  26  GLUCOSE 102* 103*  BUN 18 20  CREATININE 0.80 1.03  CALCIUM  --  8.7*  MG  --  2.0   Liver Function Tests No results for input(s): "AST", "ALT", "ALKPHOS", "BILITOT", "PROT", "ALBUMIN" in the last 72 hours. No results for input(s): "LIPASE", "AMYLASE" in the last 72 hours. Cardiac Enzymes No results for input(s): "CKTOTAL", "CKMB", "CKMBINDEX", "TROPONINI" in the last 72 hours. BNP Invalid input(s): "POCBNP" D-Dimer No results for input(s): "DDIMER" in the last 72 hours. Hemoglobin A1C No results for input(s): "HGBA1C" in the last  72 hours. Fasting Lipid Panel No results for input(s): "CHOL", "HDL", "LDLCALC", "TRIG", "CHOLHDL", "LDLDIRECT" in the last 72 hours. Thyroid Function Tests No results for input(s): "TSH", "T4TOTAL", "T3FREE", "THYROIDAB" in the last 72 hours.  Invalid input(s): "FREET3" _____________  ECHOCARDIOGRAM LIMITED  Result Date: 02/25/2022    ECHOCARDIOGRAM LIMITED REPORT   Patient Name:   Gerald Spears Date of Exam: 02/25/2022 Medical Rec #:  [id number]         Height:       71.0 in Accession #:    [id number]        Weight:       186.5 lb Date of Birth:  02/23/1941  BSA:          2.047 m Patient Age:    56 years          BP:           134/73 mmHg Patient Gender: M                 HR:           53 bpm. Exam Location:  Inpatient Procedure: Limited Echo, Cardiac Doppler and Color Doppler Indications:     I35.0 Nonrheumatic aortic (valve) stenosis  History:         Patient has prior history of Echocardiogram examinations, most                  recent 12/23/2021. Abnormal ECG, Mitral Valve Disease and Aortic                  Valve Disease; Arrythmias:Atrial Fibrillation. TAVR procedure.                  Aortic Valve: 26 mm Sapien prosthetic, stented (TAVR) valve is                  present in the aortic position. Procedure Date: 02/25/2022.  Sonographer:     Roseanna Rainbow RDCS Referring Phys:  [id number] [place] Diagnosing Phys: Sanda Klein MD                   PREPROCEDURAL FINDINGS                   Normal left ventricular systolic function and regional wall                  motion. Estimated LVEF 55%.                  Trileaflet aortic valve with severe calcific aortic stenosis.                  Peak aortic valve gradient 59 mm Hg, mean gradient 36 mm Hg,                  dimensionless index 0.28, calculated aortic valve area 1.13 cm2                  (0.55 cm2/m2 indexed for BSA), acceleration time 109 ms.                  No aortic insufficiency.                  Moderate mitral insufficiency.                   No pericardial effusion.                   POSTPROCEDURAL FINDINGS                   Normal left ventricular systolic function and regional wall                  motion. Estimated LVEF 65%.                  Well seated Edwards S3U 26 mm TAVR stent-valve.                  Peak aortic valve gradient 11 mm Hg, mean gradient 5 mm Hg,  dimensionless index 0.74, calculated aortic valve area 2.95 cm2                  (1.45 cm2/m2 indexed for BSA), acceleration time 85 ms.                  No perivalvular or central aortic insufficiency,                  Mild-moderate mitral insufficiency.                  No pericardial effusion.  IMPRESSIONS  1. Left ventricular ejection fraction, by estimation, is 55 to 60%. The left ventricle has normal function. The left ventricle has no regional wall motion abnormalities.  2. Right ventricular systolic function is normal. The right ventricular size is normal.  3. Left atrial size was mild to moderately dilated.  4. Moderate mitral valve regurgitation.  5. The aortic valve has been repaired/replaced. Unable to determine aortic valve morphology due to image quality. There is a 26 mm Sapien prosthetic (TAVR) valve present in the aortic position. Procedure Date: 02/25/2022. Aortic valve area, by VTI measures 2.95 cm. Aortic valve mean gradient measures 4.8 mmHg. Aortic valve Vmax measures 1.69 m/s. Aortic valve acceleration time measures 85 msec. FINDINGS  Left Ventricle: Left ventricular ejection fraction, by estimation, is 55 to 60%. The left ventricle has normal function. The left ventricle has no regional wall motion abnormalities. Right Ventricle: The right ventricular size is normal. No increase in right ventricular wall thickness. Right ventricular systolic function is normal. Left Atrium: Left atrial size was mild to moderately dilated. Pericardium: There is no evidence of pericardial effusion. Mitral Valve: There is mild thickening of the mitral valve  leaflet(s). Moderate mitral valve regurgitation, with centrally-directed jet. Tricuspid Valve: The tricuspid valve is normal in structure. Tricuspid valve regurgitation is mild. Aortic Valve: The aortic valve has been repaired/replaced. Aortic valve mean gradient measures 4.8 mmHg. Aortic valve peak gradient measures 11.4 mmHg. Aortic valve area, by VTI measures 2.95 cm. There is a 26 mm Sapien prosthetic, stented (TAVR) valve present in the aortic position. Procedure Date: 02/25/2022. Pulmonic Valve: The pulmonic valve was normal in structure. Pulmonic valve regurgitation is mild. Aorta: The aortic root and ascending aorta are structurally normal, with no evidence of dilitation. LEFT VENTRICLE PLAX 2D LVIDd:         5.80 cm LVIDs:         3.72 cm LV PW:         1.00 cm LV IVS:        1.20 cm LVOT diam:     2.25 cm LV SV:         116 LV SV Index:   57 LVOT Area:     3.98 cm  LV Volumes (MOD) LV vol d, MOD A2C: 106.0 ml LV vol d, MOD A4C: 89.6 ml LV vol s, MOD A2C: 49.6 ml LV vol s, MOD A4C: 31.8 ml LV SV MOD A2C:     56.4 ml LV SV MOD A4C:     89.6 ml LV SV MOD BP:      57.4 ml AORTIC VALVE AV Area (Vmax):    2.49 cm AV Area (Vmean):   1.54 cm AV Area (VTI):     2.95 cm AV Vmax:           168.99 cm/s AV Vmean:          192.597 cm/s AV VTI:  0.394 m AV Peak Grad:      11.4 mmHg AV Mean Grad:      4.8 mmHg LVOT Vmax:         106.00 cm/s LVOT Vmean:        74.400 cm/s LVOT VTI:          0.292 m LVOT/AV VTI ratio: 0.74  AORTA Ao Asc diam: 3.60 cm  SHUNTS Systemic VTI:  0.29 m Systemic Diam: 2.25 cm Sanda Klein MD Electronically signed by Sanda Klein MD Signature Date/Time: 02/25/2022/4:42:29 PM    Final (Updated)    Structural Heart Procedure  Result Date: 02/25/2022 See surgical note for result.  DG Chest 2 View  Result Date: 02/21/2022 CLINICAL DATA:  Aortic stenosis.  Pre admission. EXAM: CHEST - 2 VIEW COMPARISON:  None Available. FINDINGS: The heart size and mediastinal contours are within  normal limits. Both lungs are clear. The visualized skeletal structures are unremarkable. IMPRESSION: No active cardiopulmonary disease. Electronically Signed   By: Lovey Newcomer M.D.   On: 02/21/2022 15:06   CT CORONARY MORPH W/CTA COR W/SCORE W/CA W/CM &/OR WO/CM  Addendum Date: 02/09/2022   ADDENDUM REPORT: 02/09/2022 17:35 CLINICAL DATA:  Severe Aortic Stenosis. EXAM: Cardiac TAVR CT TECHNIQUE: A non-contrast, gated CT scan was obtained with axial slices of 3 mm through the heart for aortic valve calcium scoring. A 120 kV retrospective, gated, contrast cardiac scan was obtained. Gantry rotation speed was 250 msecs and collimation was 0.6 mm. Nitroglycerin was not given. The 3D data set was reconstructed in 5% intervals of the 0-95% of the R-R cycle. Systolic and diastolic phases were analyzed on a dedicated workstation using MPR, MIP, and VRT modes. The patient received 100 cc of contrast. FINDINGS: Image quality: Excellent. Noise artifact is: Limited. Valve Morphology: Tricuspid aortic valve with severe diffuse calcifications. Aortic Valve Calcium score: 2900 Aortic annular dimension: Phase assessed: 25% Annular area: 524 mm2 Annular perimeter: 82.6 mm Max diameter: 29.5 mm Min diameter: 22.9 mm Annular and subannular calcification: None. Membranous septum length: 11.5 mm Optimal coplanar projection: LAO 4 CRA 4 Coronary Artery Height above Annulus: Left Main: 13.7 mm Right Coronary: 16.2 mm Sinus of Valsalva Measurements: Non-coronary: 32 mm Right-coronary: 31 mm Left-coronary: 32 mm Sinus of Valsalva Height: Non-coronary: 20.4 mm Right-coronary: 18.9 mm Left-coronary: 21.7 mm Sinotubular Junction: 30 mm Ascending Thoracic Aorta: 34 mm Coronary Arteries: Normal coronary origin. Left dominance. The study was performed without use of NTG and is insufficient for plaque evaluation. Please refer to recent cardiac catheterization for coronary assessment. Severe coronary calcifications noted. Cardiac Morphology:  Right Atrium: Right atrial size is within normal limits. Right Ventricle: The right ventricular cavity is within normal limits. Left Atrium: Left atrial size is dilated in size with no left atrial appendage filling defect. Left Ventricle: The ventricular cavity size is within normal limits. Pulmonary arteries: Dilated pulmonary artery suggestive of pulmonary hypertension. Pulmonary veins: Normal pulmonary venous drainage. Pericardium: Normal thickness with no significant effusion or calcium present. Mitral Valve: The mitral valve is normal structure without significant calcification. Extra-cardiac findings: See attached radiology report for non-cardiac structures. IMPRESSION: 1. Annular measurements favor a 26 mm S3 (524 mm2). Could consider a 29 mm Evolut Pro. 2. No significant annular or subannular calcifications. 3. Sufficient coronary to annulus distance. 4. Optimal Fluoroscopic Angle for Delivery: LAO 4 CRA 4 Crisman T. Audie Box, MD Electronically Signed   By: Eleonore Chiquito M.D.   On: 02/09/2022 17:35   Result Date: 02/09/2022 EXAM: OVER-READ  INTERPRETATION  CT CHEST The following report is a limited chest CT over-read performed by radiologist Dr. Yetta Glassman of [facility], [place] on 02/06/2022. This over-read does not include interpretation of cardiac or coronary anatomy or pathology. The cardiac CTA interpretation by the cardiologist is attached. COMPARISON:  None Available. FINDINGS: Extracardiac findings will be described separately under dictation for contemporaneously obtained CTA chest, abdomen and pelvis. IMPRESSION: Please see separate dictation for contemporaneously obtained CTA chest, abdomen and pelvis dated 02/06/2022 for full description of relevant extracardiac findings. Electronically Signed: By: Yetta Glassman M.D. On: 02/06/2022 11:16   CT ANGIO CHEST AORTA W/CM & OR WO/CM  Result Date: 02/06/2022 CLINICAL DATA:  Aortic valve replacement (TAVR), pre-op eval EXAM: CT ANGIOGRAPHY  CHEST, ABDOMEN AND PELVIS TECHNIQUE: Non-contrast CT of the chest was initially obtained. Multidetector CT imaging through the chest, abdomen and pelvis was performed using the standard protocol during bolus administration of intravenous contrast. Multiplanar reconstructed images and MIPs were obtained and reviewed to evaluate the vascular anatomy. RADIATION DOSE REDUCTION: This exam was performed according to the departmental dose-optimization program which includes automated exposure control, adjustment of the mA and/or kV according to patient size and/or use of iterative reconstruction technique. CONTRAST:  127m OMNIPAQUE IOHEXOL 350 MG/ML SOLN COMPARISON:  None Available. FINDINGS: CTA CHEST FINDINGS Cardiovascular: Normal heart size. No pericardial effusion. Severe three-vessel coronary artery calcifications. Aortic valve thickening and calcifications. Mild atherosclerotic disease of the thoracic aorta. Mediastinum/Nodes: Small hiatal hernia. Thyroid is unremarkable. No pathologically enlarged lymph nodes seen in the chest. Lungs/Pleura: Central airways are patent. No consolidation, pleural effusion or pneumothorax. Musculoskeletal: No chest wall abnormality. No acute or significant osseous findings. CTA ABDOMEN AND PELVIS FINDINGS Hepatobiliary: Small scattered low-attenuation liver lesions which are likely simple cysts. Gallbladder is unremarkable. No biliary ductal dilation. Pancreas: Unremarkable. No pancreatic ductal dilatation or surrounding inflammatory changes. Spleen: Normal in size without focal abnormality. Adrenals/Urinary Tract: Bilateral adrenal glands are unremarkable. Exophytic simple cyst of the right kidney and lower pole right renal cyst, additional small low-attenuation renal lesions which are too small to completely characterize, no further follow-up imaging is recommended for these lesions. Bladder is decompressed and thick-walled. Stomach/Bowel: Stomach is within normal limits.  Diverticulosis. No evidence of bowel wall thickening, distention, or inflammatory changes. Vascular/lymphatic: No pathologically enlarged lymph nodes seen in the abdomen or pelvis. Mild atherosclerotic disease of the abdominal aorta consisting of calcified and noncalcified plaque. Moderate narrowing at the origin of the celiac and mild narrowing at the origins of the celiac, SMA and IMA due to calcified plaque. Mild narrowing at the origin of the right renal artery and severe narrowing at the origin of the left renal artery due to calcified plaque. Reproductive: Mild prostatomegaly. Other: Small fat containing right inguinal hernia. No abdominopelvic ascites. Musculoskeletal: No acute or significant osseous findings. VASCULAR MEASUREMENTS PERTINENT TO TAVR: AORTA: Minimal Aortic Diameter-16.1 mm Severity of Aortic Calcification-mild RIGHT PELVIS: Right Common Iliac Artery - Minimal Diameter-7.8 mm Tortuosity-moderate Calcification-mild Right External Iliac Artery - Minimal Diameter-9.3 mm Tortuosity-mild Calcification-none Right Common Femoral Artery - Minimal Diameter-8.3 mm Tortuosity-none Calcification-mild LEFT PELVIS: Left Common Iliac Artery - Minimal Diameter-8.3 mm Tortuosity-severe Calcification-mild Left External Iliac Artery - Minimal Diameter-9.4 mm Tortuosity-none Calcification-none Left Common Femoral Artery - Minimal Diameter-10.1 mm Tortuosity-none Calcification-none Review of the MIP images confirms the above findings. IMPRESSION: 1. Vascular findings and measurements pertinent to potential TAVR procedure, as detailed above. 2. Severe thickening calcification of the aortic valve, compatible with reported clinical history of  severe aortic stenosis. 3. Mild aortoiliac atherosclerosis. Three vessel coronary artery disease. 4. Mild bladder wall thickening which can be seen in the setting of cystitis or outlet obstruction related to prostatomegaly. Electronically Signed   By: Yetta Glassman M.D.   On:  02/06/2022 12:06   CT ANGIO ABDOMEN PELVIS  W &/OR WO CONTRAST  Result Date: 02/06/2022 CLINICAL DATA:  Aortic valve replacement (TAVR), pre-op eval EXAM: CT ANGIOGRAPHY CHEST, ABDOMEN AND PELVIS TECHNIQUE: Non-contrast CT of the chest was initially obtained. Multidetector CT imaging through the chest, abdomen and pelvis was performed using the standard protocol during bolus administration of intravenous contrast. Multiplanar reconstructed images and MIPs were obtained and reviewed to evaluate the vascular anatomy. RADIATION DOSE REDUCTION: This exam was performed according to the departmental dose-optimization program which includes automated exposure control, adjustment of the mA and/or kV according to patient size and/or use of iterative reconstruction technique. CONTRAST:  112m OMNIPAQUE IOHEXOL 350 MG/ML SOLN COMPARISON:  None Available. FINDINGS: CTA CHEST FINDINGS Cardiovascular: Normal heart size. No pericardial effusion. Severe three-vessel coronary artery calcifications. Aortic valve thickening and calcifications. Mild atherosclerotic disease of the thoracic aorta. Mediastinum/Nodes: Small hiatal hernia. Thyroid is unremarkable. No pathologically enlarged lymph nodes seen in the chest. Lungs/Pleura: Central airways are patent. No consolidation, pleural effusion or pneumothorax. Musculoskeletal: No chest wall abnormality. No acute or significant osseous findings. CTA ABDOMEN AND PELVIS FINDINGS Hepatobiliary: Small scattered low-attenuation liver lesions which are likely simple cysts. Gallbladder is unremarkable. No biliary ductal dilation. Pancreas: Unremarkable. No pancreatic ductal dilatation or surrounding inflammatory changes. Spleen: Normal in size without focal abnormality. Adrenals/Urinary Tract: Bilateral adrenal glands are unremarkable. Exophytic simple cyst of the right kidney and lower pole right renal cyst, additional small low-attenuation renal lesions which are too small to completely  characterize, no further follow-up imaging is recommended for these lesions. Bladder is decompressed and thick-walled. Stomach/Bowel: Stomach is within normal limits. Diverticulosis. No evidence of bowel wall thickening, distention, or inflammatory changes. Vascular/lymphatic: No pathologically enlarged lymph nodes seen in the abdomen or pelvis. Mild atherosclerotic disease of the abdominal aorta consisting of calcified and noncalcified plaque. Moderate narrowing at the origin of the celiac and mild narrowing at the origins of the celiac, SMA and IMA due to calcified plaque. Mild narrowing at the origin of the right renal artery and severe narrowing at the origin of the left renal artery due to calcified plaque. Reproductive: Mild prostatomegaly. Other: Small fat containing right inguinal hernia. No abdominopelvic ascites. Musculoskeletal: No acute or significant osseous findings. VASCULAR MEASUREMENTS PERTINENT TO TAVR: AORTA: Minimal Aortic Diameter-16.1 mm Severity of Aortic Calcification-mild RIGHT PELVIS: Right Common Iliac Artery - Minimal Diameter-7.8 mm Tortuosity-moderate Calcification-mild Right External Iliac Artery - Minimal Diameter-9.3 mm Tortuosity-mild Calcification-none Right Common Femoral Artery - Minimal Diameter-8.3 mm Tortuosity-none Calcification-mild LEFT PELVIS: Left Common Iliac Artery - Minimal Diameter-8.3 mm Tortuosity-severe Calcification-mild Left External Iliac Artery - Minimal Diameter-9.4 mm Tortuosity-none Calcification-none Left Common Femoral Artery - Minimal Diameter-10.1 mm Tortuosity-none Calcification-none Review of the MIP images confirms the above findings. IMPRESSION: 1. Vascular findings and measurements pertinent to potential TAVR procedure, as detailed above. 2. Severe thickening calcification of the aortic valve, compatible with reported clinical history of severe aortic stenosis. 3. Mild aortoiliac atherosclerosis. Three vessel coronary artery disease. 4. Mild bladder  wall thickening which can be seen in the setting of cystitis or outlet obstruction related to prostatomegaly. Electronically Signed   By: LYetta GlassmanM.D.   On: 02/06/2022 12:06    Disposition  Pt is being discharged home today in good condition.  Follow-up Plans & Appointments    Follow-up Information     Eileen Stanford, PA-C Follow up on 03/05/2022.   Specialties: Cardiology, Radiology Why: at 4pm. Please arrive at 3:45pm for your appointment. Contact information: [place] [address] [phone]                Discharge Instructions     Amb Referral to Cardiac Rehabilitation   Complete by: As directed    Diagnosis: Valve Replacement   Valve: Aortic Comment - TAVR   After initial evaluation and assessments completed: Virtual Based Care may be provided alone or in conjunction with Phase 2 Cardiac Rehab based on patient barriers.: Yes   Call MD for:  difficulty breathing, headache or visual disturbances   Complete by: As directed    Call MD for:  extreme fatigue   Complete by: As directed    Call MD for:  hives   Complete by: As directed    Call MD for:  persistant dizziness or light-headedness   Complete by: As directed    Call MD for:  persistant nausea and vomiting   Complete by: As directed    Call MD for:  redness, tenderness, or signs of infection (pain, swelling, redness, odor or green/yellow discharge around incision site)   Complete by: As directed    Call MD for:  severe uncontrolled pain   Complete by: As directed    Call MD for:  temperature >100.4   Complete by: As directed    Diet - low sodium heart healthy   Complete by: As directed    Discharge instructions   Complete by: As directed    ACTIVITY AND EXERCISE  Daily activity and exercise are an important part of your recovery. People recover at different rates depending on their general health and type of valve procedure.  Most people recovering from  TAVR feel better relatively quickly   No lifting, pushing, pulling more than 10 pounds (examples to avoid: groceries, vacuuming, gardening, golfing):             - For one week with a procedure through the groin.             - For six weeks for procedures through the chest wall or neck. NOTE: You will typically see one of our providers 7-14 days after your procedure to discuss [place] the above activities.      DRIVING  Do not drive until you are seen for follow up and cleared by a provider. Generally, we ask patient to not drive for 1 week after their procedure.  If you have been told by your doctor in the past that you may not drive, you must talk with him/her before you begin driving again.   DRESSING  Groin site: you may leave the clear dressing over the site for up to one week or until it falls off.   HYGIENE  If you had a femoral (leg) procedure, you may take a shower when you return home. After the shower, pat the site dry. Do NOT use powder, oils or lotions in your groin area until the site has completely healed.  If you had a chest procedure, you may shower when you return home unless specifically instructed not to by your discharging practitioner.             - DO NOT scrub incision; pat dry  with a towel.             - DO NOT apply any lotions, oils, powders to the incision.             - No tub baths / swimming for at least 2 weeks.  If you notice any fevers, chills, increased pain, swelling, bleeding or pus, please contact your doctor.   ADDITIONAL INFORMATION  If you are going to have an upcoming dental procedure, please contact our office as you will require antibiotics ahead of time to prevent infection on your heart valve.    If you have any questions or concerns you can call the structural heart phone during normal business hours 8am-4pm. If you have an urgent need after hours or weekends please call [phone] to talk to the on call provider for general  cardiology. If you have an emergency that requires immediate attention, please call 911.    After TAVR Checklist  Check  Test Description  Follow up appointment in 1-2 weeks  You will see our structural heart advanced practice provider. Your incision sites will be checked and you will be cleared to drive and resume all normal activities if you are doing well.    1 month echo and follow up  You will have an echo to check on your new heart valve and be seen back in the office by a structural heart advanced practice provider.  Follow up with your primary cardiologist You will need to be seen by your primary cardiologist in the following 3-6 months after your 1 month appointment in the valve clinic.   1 year echo and follow up You will have another echo to check on your heart valve after 1 year and be seen back in the office by a structural heart advanced practice provider. This your last structural heart visit.  Bacterial endocarditis prophylaxis  You will have to take antibiotics for the rest of your life before all dental procedures (even teeth cleanings) to protect your heart valve. Antibiotics are also required before some surgeries. Please check with your cardiologist before scheduling any surgeries. Also, please make sure to tell us if you have a penicillin allergy as you will require an alternative antibiotic.   Increase activity slowly   Complete by: As directed        Discharge Medications   Allergies as of 02/26/2022   No Known Allergies      Medication List     TAKE these medications    acetaminophen 650 MG CR tablet Commonly known as: TYLENOL Take 650 mg by mouth every 8 (eight) hours as needed for pain.   alfuzosin 10 MG 24 hr tablet Commonly known as: UROXATRAL Take 1 tablet (10 mg total) by mouth at bedtime.   amoxicillin 500 MG capsule Commonly known as: AMOXIL Take 2,000 mg by mouth See admin instructions. Take 2000 mg 1 hour prior to dental work   atorvastatin  10 MG tablet Commonly known as: LIPITOR Take 1 tablet (10 mg total) by mouth daily. What changed: when to take this   cholecalciferol 25 MCG (1000 UNIT) tablet Commonly known as: VITAMIN D3 Take 1,000 Units by mouth daily.   doxycycline 100 MG tablet Commonly known as: VIBRA-TABS Take 100 mg by mouth daily. Continuous   Eliquis 5 MG Tabs tablet Generic drug: apixaban TAKE 1 TABLET BY MOUTH TWICE A DAY What changed: how much to take   finasteride 5 MG tablet Commonly known as: PROSCAR Take 1  tablet (5 mg total) by mouth daily at 12 noon.   Fish Oil 1000 MG Caps Take 1,000 mg by mouth daily.   metoprolol succinate 25 MG 24 hr tablet Commonly known as: Toprol XL Take 1 tablet (25 mg total) by mouth daily.   valACYclovir 500 MG tablet Commonly known as: VALTREX Take 500 mg by mouth 2 (two) times daily as needed (Cold sore). BID for 5 days at onset of cold sore   vitamin B-12 1000 MCG tablet Commonly known as: CYANOCOBALAMIN Take 1,000 mcg by mouth daily.            Outstanding Labs/Studies   None   Duration of Discharge Encounter   Greater than 30 minutes including physician time.  Signed, Kathyrn Drown, NP 02/26/2022, 11:01 AM [phone]   ATTENDING ATTESTATION:  After conducting a review of all available clinical information with the care team, interviewing the patient, and performing a physical exam, I agree with the findings and plan described in this note.   GEN: No acute distress.   HEENT:  MMM, no JVD, no scleral icterus Cardiac: RRR, no murmurs, rubs, or gallops.  Respiratory: Clear to auscultation bilaterally. GI: Soft, nontender, non-distended  MS: No edema; No deformity. Neuro:  Nonfocal  Vasc:  +2 radial pulses; groin stable  The patient is a 81 year old male with a history of paroxysmal atrial fibrillation on anticoagulation, hypertension, moderate obstructive coronary artery disease, and severe symptomatic aortic stenosis who was  evaluated in the multidisciplinary fashion and referred for planned transcatheter aortic valve replacement.  He underwent this procedure which was uncomplicated with a 26 mm SAPIEN 3 valve from the right transfemoral approach with direct LV pacing.  Overnight the patient remained stable.  His telemetry was reassuring.  His access sites were intact and he had no signs or symptoms of TIA or stroke.  He was judged stable for discharge home with resumption him of his anticoagulation today.  We will plan for close follow-up.  Lenna Sciara, MD Pager [phone]

## 2022-02-26 NOTE — Progress Notes (Signed)
  Echocardiogram 2D Echocardiogram has been performed.  Darlina Sicilian M 02/26/2022, 8:53 AM

## 2022-02-26 NOTE — Plan of Care (Signed)
  Problem: Cardiovascular: Goal: Vascular access site(s) Level 0-1 will be maintained Outcome: Progressing   

## 2022-03-04 ENCOUNTER — Telehealth (HOSPITAL_COMMUNITY): Payer: Self-pay

## 2022-03-04 NOTE — Telephone Encounter (Signed)
Pt insurance is active and benefits verified through Medicare a/b Co-pay 0, DED $226/$226 met, out of pocket 0/0 met, co-insurance 20%. no pre-authorization required. Passport, 03/04/2022'@9' :36am, REF# [id number]  How many CR sessions are covered? (36 sessions for TCR, 72 sessions for ICR)72 Is this a lifetime maximum or an annual maximum? lifetime Has the member used any of these services to date? no Is there a time limit (weeks/months) on start of program and/or program completion? no  2ndary insurance is active and benefits verified through [employer]. Co-pay 0, DED 0/0 met, out of pocket 0/0 met, co-insurance 0%. No pre-authorization required.   Will contact patient to see if he is interested in the Cardiac Rehab Program. If interested, patient will need to complete follow up appt. Once completed, patient will be contacted for scheduling upon review by the RN Navigator.

## 2022-03-05 ENCOUNTER — Encounter: Payer: Medicare Other | Admitting: Surgery

## 2022-03-05 ENCOUNTER — Ambulatory Visit (INDEPENDENT_AMBULATORY_CARE_PROVIDER_SITE_OTHER): Payer: Medicare Other | Admitting: Physician Assistant

## 2022-03-05 VITALS — BP 124/66 | HR 54 | Ht 71.5 in | Wt 190.8 lb

## 2022-03-05 DIAGNOSIS — Z952 Presence of prosthetic heart valve: Secondary | ICD-10-CM

## 2022-03-05 DIAGNOSIS — I2510 Atherosclerotic heart disease of native coronary artery without angina pectoris: Secondary | ICD-10-CM | POA: Diagnosis not present

## 2022-03-05 DIAGNOSIS — I480 Paroxysmal atrial fibrillation: Secondary | ICD-10-CM | POA: Diagnosis not present

## 2022-03-05 DIAGNOSIS — I10 Essential (primary) hypertension: Secondary | ICD-10-CM | POA: Diagnosis not present

## 2022-03-05 DIAGNOSIS — I9789 Other postprocedural complications and disorders of the circulatory system, not elsewhere classified: Secondary | ICD-10-CM

## 2022-03-05 NOTE — Progress Notes (Signed)
[facility] [place]                                     Cardiology Office Note:    Date:  03/05/2022   ID:  [name], DOB 11-Oct-1940, MRN [id number]  PCP:  [name], MD  [facility] Cardiologist:  Evalina Field, MD  / Dr. Ali Lowe, MD & Dr. Cyndia Bent, MD (TAVR) [place] Electrophysiologist:  None   Referring MD: [name], MD   [facility] s/p TAVR  History of Present Illness:    AIZIK REH is a 81 y.o. male with a hx of paroxysmal atrial fibrillation on Eliquis, hypertension, CAD, and severe aortic stenosis s/p TAVR (02/25/22) who presents to clinic for follow up.   Mr. Fritze underwent a cardiac catheterization in 08/2020 after presenting with exertional dyspnea and having an abnormal nuclear stress test with a reversible perfusion defect in the lateral apex. Cardiac cath showed severe diagonal branch stenosis but no other obstructive epicardial CAD with recommendations for continued medical therapy. He then began having fairly rapid progression of aortic stenosis. In 2020 the mean transaortic gradient was 16 mmHg with a dimensionless index of 0.34. By 2022 this had increased to 32 mmHg with a dimensionless index of 0.35.  Most recent echocardiogram with a  peak and mean gradients of 62 and 40 mmHg, respectively, with a dimensionless index of 0.17 and calculated valve area of 0.7 cm.   Last year, the patient underwent revision of the left knee replacement.  After surgery, he had COVID-pneumonia and also began having issues with paroxysmal atrial fibrillation with rather poor progression since that time.    Updated R/LHC was performed 01/23/22 that showed severe diagonal stenosis at 60% with otherwise non-obstructive disease. Severe calcific aortic stenosis with mean transvalvular gradient of 51 mmHg, calculated aortic valve area of 0.44 cm. Normal right heart pressures with preserved cardiac output.   He was seen by  the multidisciplinary valve team and underwent successful TAVR with a 26 mm Edwards Sapien 3 UR via the TF  approach on 02/25/22. Post operative echo showed EF 60%, normally functioning TAVR with a mean gradient of 12.8 mmHg and no PVL. He was discharged home on his Eliquis.   Today the patient presents to clinic for follow up. No CP or SOB. No LE edema, orthopnea or PND. No dizziness or syncope. No blood in stool or urine. No palpitations. He has much more energy since his TAVR but still not to where he wants to be. Feels deconditioned. Thinking about cardiac rehab.     Past Medical History:  Diagnosis Date   Cancer [facility])    colon cancer and melanoma   Complication of anesthesia    difficult to wake up after surgery   Coronary artery disease    DJD (degenerative joint disease)    Dr. Noemi Chapel end stages of DJD 2011   Hard of hearing    History of hiatal hernia    PAF (paroxysmal atrial fibrillation) (HCC)    S/P TAVR (transcatheter aortic valve replacement) 02/25/2022   S3UR 64m via TF apprach with Dr. TAli Loweand Dr. BCyndia Bent  Severe aortic stenosis    Urinary frequency    UTI (lower urinary tract infection)     Past Surgical History:  Procedure Laterality Date   ANKLE SURGERY Right    COLONOSCOPY W/ BIOPSIES  AND POLYPECTOMY     ELBOW SURGERY Right    EYE SURGERY     HERNIA REPAIR     INTRAOPERATIVE TRANSTHORACIC ECHOCARDIOGRAM N/A 02/25/2022   Procedure: INTRAOPERATIVE TRANSTHORACIC ECHOCARDIOGRAM;  Surgeon: Early Osmond, MD;  Location: [place] CV LAB;  Service: Open Heart Surgery;  Laterality: N/A;   JOINT REPLACEMENT     KNEE SURGERY Right    x4   LEFT HEART CATH AND CORONARY ANGIOGRAPHY N/A 09/12/2020   Procedure: LEFT HEART CATH AND CORONARY ANGIOGRAPHY;  Surgeon: Sherren Mocha, MD;  Location: [place] CV LAB;  Service: Cardiovascular;  Laterality: N/A;   MULTIPLE TOOTH EXTRACTIONS     RIGHT/LEFT HEART CATH AND CORONARY ANGIOGRAPHY N/A 01/23/2022   Procedure:  RIGHT/LEFT HEART CATH AND CORONARY ANGIOGRAPHY;  Surgeon: Sherren Mocha, MD;  Location: [place] CV LAB;  Service: Cardiovascular;  Laterality: N/A;   TOTAL KNEE ARTHROPLASTY Right    TOTAL KNEE ARTHROPLASTY Left 06/18/2015   Procedure: TOTAL KNEE ARTHROPLASTY;  Surgeon: Frederik Pear, MD;  Location: [place];  Service: Orthopedics;  Laterality: Left;   TOTAL KNEE REVISION Right 02/11/2021   Procedure: RIGHT TOTAL KNEE REVISION;  Surgeon: Frederik Pear, MD;  Location: [facility];  Service: Orthopedics;  Laterality: Right;   TRANSCATHETER AORTIC VALVE REPLACEMENT, TRANSFEMORAL N/A 02/25/2022   Procedure: Transcatheter Aortic Valve Replacement, Transfemoral;  Surgeon: Early Osmond, MD;  Location: [place] CV LAB;  Service: Open Heart Surgery;  Laterality: N/A;    Current Medications: Current Meds  Medication Sig   acetaminophen (TYLENOL) 650 MG CR tablet Take 650 mg by mouth every 8 (eight) hours as needed for pain.   alfuzosin (UROXATRAL) 10 MG 24 hr tablet Take 1 tablet (10 mg total) by mouth at bedtime.   amoxicillin (AMOXIL) 500 MG capsule Take 2,000 mg by mouth See admin instructions. Take 2000 mg 1 hour prior to dental work   apixaban (ELIQUIS) 5 MG TABS tablet TAKE 1 TABLET BY MOUTH TWICE A DAY (Patient taking differently: Take 5 mg by mouth 2 (two) times daily.)   atorvastatin (LIPITOR) 10 MG tablet TAKE MON-WEN- AND FRIDAYS   cholecalciferol (VITAMIN D3) 25 MCG (1000 UNIT) tablet Take 1,000 Units by mouth daily.   doxycycline (VIBRA-TABS) 100 MG tablet Take 100 mg by mouth daily. Continuous   finasteride (PROSCAR) 5 MG tablet Take 1 tablet (5 mg total) by mouth daily at 12 noon.   metoprolol succinate (TOPROL XL) 25 MG 24 hr tablet Take 1 tablet (25 mg total) by mouth daily.   Omega-3 Fatty Acids (FISH OIL) 1000 MG CAPS Take 1,000 mg by mouth daily.   valACYclovir (VALTREX) 500 MG tablet Take 500 mg by mouth 2 (two) times daily as needed (Cold sore). BID for 5 days at onset of cold sore    vitamin B-12 (CYANOCOBALAMIN) 1000 MCG tablet Take 1,000 mcg by mouth daily.     Allergies:   Patient has no known allergies.   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  Tobacco Use   Smoking status: Never   Smokeless tobacco: Never  Vaping Use   Vaping Use: Never used  Substance and Sexual Activity   Alcohol use: No   Drug use: No   Sexual activity: Yes  Other  Topics Concern   Not on file  Social History Narrative   Not on file   Social Determinants of Health   Financial Resource Strain: Not on file  Food Insecurity: Not on file  Transportation Needs: Not on file  Physical Activity: Not on file  Stress: Not on file  Social Connections: Not on file     Family History: The patient's family history includes Breast cancer in his sister and sister; Heart attack in his mother; Heart disease in his father.  ROS:   Please see the history of present illness.    All other systems reviewed and are negative.  EKGs/Labs/Other Studies Reviewed:    The following studies were reviewed today:  TAVR OPERATIVE NOTE     Date of Procedure:                02/25/2022   Preoperative Diagnosis:      Severe Aortic Stenosis    Postoperative Diagnosis:    Same    Procedure:        Transcatheter Aortic Valve Replacement - Percutaneous Right Transfemoral Approach             Edwards Sapien 3 Ultra Resilia THV (size 26 mm, model # 9755RSL, serial # [id number])              Co-Surgeons:                        Gaye Pollack, MD and Lenna Sciara, MD     Anesthesiologist:                  Adele Barthel, MD   Echocardiographer:              Sanda Klein, MD   Pre-operative Echo Findings: Severe aortic stenosis Normal left ventricular systolic function   Post-operative  Echo Findings: No paravalvular leak Normal left ventricular systolic function ____________   Echo 02/26/22:  IMPRESSIONS   1. Left ventricular ejection fraction, by estimation, is 60 to 65%. The  left ventricle has normal function. The left ventricle has no regional  wall motion abnormalities. The left ventricular internal cavity size was  mildly dilated. Left ventricular  diastolic parameters are indeterminate.   2. Right ventricular systolic function is normal. The right ventricular  size is normal. There is normal pulmonary artery systolic pressure. The  estimated right ventricular systolic pressure is 83.1 mmHg.   3. Left atrial size was mild to moderately dilated.   4. The mitral valve is normal in structure. Mild mitral valve  regurgitation. No evidence of mitral stenosis.   5. Tricuspid valve regurgitation is mild to moderate.   6. The aortic valve has been repaired/replaced. Aortic valve  regurgitation is trivial. There is a 26 mm Sapien prosthetic (TAVR) valve  present in the aortic position. Procedure Date: 02/25/2022. Aortic valve  area, by VTI measures 2.83 cm. Aortic valve  mean gradient measures 12.8 mmHg. Aortic valve Vmax measures 2.54 m/s.  Aortic valve acceleration time measures 78 msec.   EKG:  EKG is ordered today.  The ekg ordered today demonstrates sinus brady, PACs with HR 54   Recent Labs: 02/21/2022: ALT 19 02/26/2022: BUN 20; Creatinine, Ser 1.03; Hemoglobin 11.6; Magnesium 2.0; Platelets 89; Potassium 4.1; Sodium 139  Recent Lipid Panel No results found for: "CHOL", "TRIG", "HDL", "CHOLHDL", "VLDL", "LDLCALC", "LDLDIRECT"   Risk Assessment/Calculations:    CHA2DS2-VASc Score = 4   This indicates a 4.8% annual  risk of stroke. The patient's score is based upon: CHF History: 0 HTN History: 1 Diabetes History: 0 Stroke History: 0 Vascular Disease History: 1 Age Score: 2 Gender Score: 0      Physical Exam:    VS:  BP 124/66   Pulse (!) 54   Ht  5' 11.5" (1.816 m)   Wt 190 lb 12.8 oz (86.5 kg)   SpO2 99%   BMI 26.24 kg/m     Wt Readings from Last 3 Encounters:  03/05/22 190 lb 12.8 oz (86.5 kg)  02/26/22 183 lb 3.2 oz (83.1 kg)  02/21/22 186 lb 6.4 oz (84.6 kg)     GEN:  Well nourished, well developed in no acute distress HEENT: Normal NECK: No JVD LYMPHATICS: No lymphadenopathy CARDIAC: RRR, no murmurs, rubs, gallops RESPIRATORY:  Clear to auscultation without rales, wheezing or rhonchi  ABDOMEN: Soft, non-tender, non-distended MUSCULOSKELETAL:  No edema; No deformity  SKIN: Warm and dry.  Groin sites clear without hematoma but diffuse ecchymosis. Acorn sized lump on right incision that is mildly tender to palpation.  NEUROLOGIC:  Alert and oriented x 3 PSYCHIATRIC:  Normal affect   ASSESSMENT:    1. S/P TAVR (transcatheter aortic valve replacement)   2. Postprocedural disorder of circulatory system   3. PAF (paroxysmal atrial fibrillation) ([place])   4. Essential hypertension   5. Coronary artery disease involving native coronary artery of native heart without angina pectoris    PLAN:    In order of problems listed above:  Severe AS s/p TAVR:  doing great 1 week out from TAVR with an improvement in symptoms but not back to baseline. I encouraged cardiac rehab. He gets amoxicillin for SBE prophylaxis from the dentist. Groin sites are stable aside for small lump on right and ecchymosis. ECG with no HAVB. We will see him back in 1 month for follow up and echo.   Possible PSA: acorn sized lump on right incision. Mild tenderness. No bruit. I think this is just arterial scar tissues but will get an ultrasound to rule out PSA  PAF: maintaining NSR/SB. Continue Eliquis '5mg'$  BID and Toprol '25mg'$  QD   HTN: stable. No changes made.    CAD: denies anginal symptoms. No ASA in the setting of Eliquis. Continue Omega 3/statin, beta blocker.      Cardiac Rehabilitation Eligibility Assessment  The patient is ready to start  cardiac rehabilitation from a cardiac standpoint.         Medication Adjustments/Labs and Tests Ordered: Current medicines are reviewed at length with the patient today.  Concerns regarding medicines are outlined above.  Orders Placed This Encounter  Procedures   VAS [place] GROIN PSEUDOANEURYSM   No orders of the defined types were placed in this encounter.   Patient Instructions  Medication Instructions:  Your physician recommends that you continue on your current medications as directed. Please refer to the Current Medication list given to you today.  *If you need a refill on your cardiac medications before your next appointment, please call your pharmacy*   Lab Work: None ordered   If you have labs (blood work) drawn today and your tests are completely normal, you will receive your results only by: [place] (if you have MyChart) OR A paper copy in the mail If you have any lab test that is abnormal or we need to change your treatment, we will call you to review the results.   Testing/Procedures: Your physician has ordered for you to have a  ultrasound of your groin    Follow-Up: Follow up as scheduled   Other Instructions   Important Information About Sugar         Signed, Angelena Form, PA-C  03/05/2022 3:57 PM    [place]

## 2022-03-05 NOTE — Patient Instructions (Addendum)
Medication Instructions:  Your physician recommends that you continue on your current medications as directed. Please refer to the Current Medication list given to you today.  *If you need a refill on your cardiac medications before your next appointment, please call your pharmacy*   Lab Work: None ordered   If you have labs (blood work) drawn today and your tests are completely normal, you will receive your results only by: [place] (if you have MyChart) OR A paper copy in the mail If you have any lab test that is abnormal or we need to change your treatment, we will call you to review the results.   Testing/Procedures: Your physician has ordered for you to have a ultrasound of your groin    Follow-Up: Follow up as scheduled   Other Instructions   Important Information About Sugar

## 2022-03-06 ENCOUNTER — Ambulatory Visit (HOSPITAL_COMMUNITY)
Admission: RE | Admit: 2022-03-06 | Discharge: 2022-03-06 | Disposition: A | Discharge status: Home / Self Care | Payer: Medicare Other | Source: Ambulatory Visit | Attending: Physician Assistant | Admitting: Physician Assistant

## 2022-03-06 DIAGNOSIS — I9789 Other postprocedural complications and disorders of the circulatory system, not elsewhere classified: Secondary | ICD-10-CM | POA: Insufficient documentation

## 2022-03-06 DIAGNOSIS — I97638 Postprocedural hematoma of a circulatory system organ or structure following other circulatory system procedure: Secondary | ICD-10-CM

## 2022-03-06 NOTE — Addendum Note (Signed)
Addended by: Drue Novel I on: 03/06/2022 11:31 AM   Modules accepted: Orders

## 2022-03-10 ENCOUNTER — Telehealth (HOSPITAL_COMMUNITY): Payer: Self-pay | Admitting: *Deleted

## 2022-03-10 NOTE — Telephone Encounter (Signed)
Message left on departmental voice mail regarding scheduling Cardiac rehab.  Called and spoke to pt wife, [name] who is listed on his DPR.  Reviewed scheduling process and suggested she create a contact for our number so she does not miss our call to schedule.  Pt completed his follow up on 7/19 and ultrasound negative for pseudoaneurysm.  Assured pt wife that his name has been added to the wait list.  Wait time do vary.  Interested in the 6:45 exercise class time.  Cherre Huger, BSN Cardiac and Training and development officer

## 2022-03-12 ENCOUNTER — Ambulatory Visit: Payer: Medicare Other | Admitting: Urology

## 2022-03-25 NOTE — Progress Notes (Unsigned)
HEART AND [place]                                     Cardiology Office Note:    Date:  03/26/2022   ID:  Gerald Spears, DOB 1940/10/24, MRN [id number]  PCP:  Gerald Frees, MD  [facility] Cardiologist:  Gerald Field, MD / Dr. Ali Lowe, MD and Dr. Cyndia Bent, MD (TAVR) [facility] Electrophysiologist:  None   Referring MD: Gerald Frees, MD   Chief Complaint  Patient presents with   Follow-up    1 month s/p TAVR    History of Present Illness:    Gerald Spears is a 81 y.o. male with a hx of  paroxysmal atrial fibrillation on Eliquis, hypertension, CAD, and severe aortic stenosis s/p TAVR (02/25/22) who presents to clinic for follow up.    [name] underwent a cardiac catheterization in 08/2020 after presenting with exertional dyspnea and having an abnormal nuclear stress test with a reversible perfusion defect in the lateral apex. Cardiac cath showed severe diagonal branch stenosis but no other obstructive epicardial CAD with recommendations for continued medical therapy. He then began having fairly rapid progression of aortic stenosis. In 2020 the mean transaortic gradient was 16 mmHg with a dimensionless index of 0.34. By 2022 this had increased to 32 mmHg with a dimensionless index of 0.35.  Most recent echocardiogram with a  peak and mean gradients of 62 and 40 mmHg, respectively, with a dimensionless index of 0.17 and calculated valve area of 0.7 cm.   Last year, the patient underwent revision of the left knee replacement.  After surgery, he had COVID-pneumonia and also began having issues with paroxysmal atrial fibrillation with rather poor progression since that time.    Updated R/LHC was performed 01/23/22 that showed severe diagonal stenosis at 60% with otherwise non-obstructive disease. Severe calcific aortic stenosis with mean transvalvular gradient of 51 mmHg, calculated aortic valve area of 0.44 cm. Normal right  heart pressures with preserved cardiac output.    He was seen by the multidisciplinary valve team and underwent successful TAVR with a 26 mm Edwards Sapien 3 UR via the TF  approach on 02/25/22. Post operative echo showed EF 60%, normally functioning TAVR with a mean gradient of 12.8 mmHg and no PVL. He was discharged home on his Eliquis.   In TOC follow up he was doing very well with no specific issues. He is here with his wife and continues to do great with no SOB, chest pain, LE edema, orthopnea, dizziness, bleeding in stool or urine, or syncope. He is still waiting to be plugged into cardiac rehab as they have 30 patients ahead of him. He has played golf several times since his surgery and reports improvement in SOB and fatigue.   Past Medical History:  Diagnosis Date   Cancer [name])    colon cancer and melanoma   Complication of anesthesia    difficult to wake up after surgery   Coronary artery disease    DJD (degenerative joint disease)    Dr. Noemi Chapel end stages of DJD 2011   Hard of hearing    History of hiatal hernia    PAF (paroxysmal atrial fibrillation) (HCC)    S/P TAVR (transcatheter aortic valve replacement) 02/25/2022   S3UR 87m via TF apprach with Dr. TAli Loweand Dr. BCyndia Spears  Severe aortic stenosis  Urinary frequency    UTI (lower urinary tract infection)     Past Surgical History:  Procedure Laterality Date   ANKLE SURGERY Right    COLONOSCOPY W/ BIOPSIES AND POLYPECTOMY     ELBOW SURGERY Right    EYE SURGERY     HERNIA REPAIR     INTRAOPERATIVE TRANSTHORACIC ECHOCARDIOGRAM N/A 02/25/2022   Procedure: INTRAOPERATIVE TRANSTHORACIC ECHOCARDIOGRAM;  Surgeon: Early Osmond, MD;  Location: [place] [facility];  Service: Open Heart Surgery;  Laterality: N/A;   JOINT REPLACEMENT     KNEE SURGERY Right    x4   LEFT HEART CATH AND CORONARY ANGIOGRAPHY N/A 09/12/2020   Procedure: LEFT HEART CATH AND CORONARY ANGIOGRAPHY;  Surgeon: Gerald Mocha, MD;  Location: [place] [facility];  Service: Cardiovascular;  Laterality: N/A;   MULTIPLE TOOTH EXTRACTIONS     RIGHT/LEFT HEART CATH AND CORONARY ANGIOGRAPHY N/A 01/23/2022   Procedure: RIGHT/LEFT HEART CATH AND CORONARY ANGIOGRAPHY;  Surgeon: Gerald Mocha, MD;  Location: [place] [facility];  Service: Cardiovascular;  Laterality: N/A;   TOTAL KNEE ARTHROPLASTY Right    TOTAL KNEE ARTHROPLASTY Left 06/18/2015   Procedure: TOTAL KNEE ARTHROPLASTY;  Surgeon: Gerald Pear, MD;  Location: [place];  Service: Orthopedics;  Laterality: Left;   TOTAL KNEE REVISION Right 02/11/2021   Procedure: RIGHT TOTAL KNEE REVISION;  Surgeon: Gerald Pear, MD;  [facility];  Service: Orthopedics;  Laterality: Right;   TRANSCATHETER AORTIC VALVE REPLACEMENT, TRANSFEMORAL N/A 02/25/2022   Procedure: Transcatheter Aortic Valve Replacement, Transfemoral;  Surgeon: Early Osmond, MD;  Location: [place] [facility];  Service: Open Heart Surgery;  Laterality: N/A;    Current Medications: Current Meds  Medication Sig   acetaminophen (TYLENOL) 650 MG CR tablet Take 650 mg by mouth every 8 (eight) hours as needed for pain.   alfuzosin (UROXATRAL) 10 MG 24 hr tablet Take 1 tablet (10 mg total) by mouth at bedtime.   amoxicillin (AMOXIL) 500 MG capsule Take 2,000 mg by mouth See admin instructions. Take 2000 mg 1 hour prior to dental work   apixaban (ELIQUIS) 5 MG TABS tablet TAKE 1 TABLET BY MOUTH TWICE A DAY   atorvastatin (LIPITOR) 10 MG tablet TAKE MON-WEN- AND FRIDAYS   cholecalciferol (VITAMIN D3) 25 MCG (1000 UNIT) tablet Take 1,000 Units by mouth daily.   doxycycline (VIBRA-TABS) 100 MG tablet Take 100 mg by mouth daily. Continuous   finasteride (PROSCAR) 5 MG tablet Take 1 tablet (5 mg total) by mouth daily at 12 noon.   metoprolol succinate (TOPROL XL) 25 MG 24 hr tablet Take 1 tablet (25 mg total) by mouth daily.   Omega-3 Fatty Acids (FISH OIL) 1000 MG CAPS Take 1,000 mg by mouth daily.   valACYclovir (VALTREX) 500 MG tablet  Take 500 mg by mouth 2 (two) times daily as needed (Cold sore). BID for 5 days at onset of cold sore   vitamin B-12 (CYANOCOBALAMIN) 1000 MCG tablet Take 1,000 mcg by mouth daily.     Allergies:   Patient has no known allergies.   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  Tobacco Use   Smoking status: Never   Smokeless tobacco: Never  Vaping Use   Vaping Use: Never used  Substance  and Sexual Activity   Alcohol use: No   Drug use: No   Sexual activity: Yes  Other Topics Concern   Not on file  Social History Narrative   Not on file   Social Determinants of Health   Financial Resource Strain: Not on file  Food Insecurity: Not on file  Transportation Needs: Not on file  Physical Activity: Not on file  Stress: Not on file  Social Connections: Not on file     Family History: The patient's family history includes Breast cancer in his sister and sister; Heart attack in his mother; Heart disease in his father.  ROS:   Please see the history of present illness.    All other systems reviewed and are negative.  EKGs/Labs/Other Studies Reviewed:    The following studies were reviewed today:  Echocardiogram 03/26/22:   1. Left ventricular ejection fraction, by estimation, is 55%. The left  ventricle has normal function. The left ventricle has no regional wall  motion abnormalities. There is mild left ventricular hypertrophy. Left  ventricular diastolic parameters are  consistent with Grade II diastolic dysfunction (pseudonormalization).   2. Right ventricular systolic function is normal. The right ventricular  size is normal. There is normal pulmonary artery systolic pressure. The  estimated right ventricular systolic pressure is 64.4 mmHg.   3. Left atrial size was  mildly dilated.   4. The mitral valve is normal in structure. Mild to moderate mitral valve  regurgitation. No evidence of mitral stenosis.   5. Bioprosthetic aortic valve s/p TAVR. There is a 26 mm Sapien THV.  Mild-moderate perivalvular regurgitation. Mean gradint 12 mmHg with DI  0.52, EOA 1.63 cm^2.   6. The inferior vena cava is normal in size with greater than 50%  respiratory variability, suggesting right atrial pressure of 3 mmHg.    TAVR OPERATIVE NOTE     Date of Procedure:                02/25/2022   Preoperative Diagnosis:      Severe Aortic Stenosis    Postoperative Diagnosis:    Same    Procedure:        Transcatheter Aortic Valve Replacement - Percutaneous Right Transfemoral Approach             Edwards Sapien 3 Ultra Resilia THV (size 26 mm, model # 9755RSL, serial # [id number])              Co-Surgeons:                        Gaye Pollack, MD and Lenna Sciara, MD     Anesthesiologist:                  Adele Barthel, MD   Echocardiographer:              Sanda Klein, MD   Pre-operative Echo Findings: Severe aortic stenosis Normal left ventricular systolic function   Post-operative Echo Findings: No paravalvular leak Normal left ventricular systolic function ____________   Echo 02/26/22:  IMPRESSIONS   1. Left ventricular ejection fraction, by estimation, is 60 to 65%. The  left ventricle has normal function. The left ventricle has no regional  wall motion abnormalities. The left ventricular internal cavity size was  mildly dilated. Left ventricular  diastolic parameters are indeterminate.   2. Right ventricular systolic function is normal. The right ventricular  size is normal. There is normal pulmonary artery systolic  pressure. The  estimated right ventricular systolic pressure is 73.2 mmHg.   3. Left atrial size was mild to moderately dilated.   4. The mitral valve is normal in structure. Mild mitral valve  regurgitation. No evidence of mitral  stenosis.   5. Tricuspid valve regurgitation is mild to moderate.   6. The aortic valve has been repaired/replaced. Aortic valve  regurgitation is trivial. There is a 26 mm Sapien prosthetic (TAVR) valve  present in the aortic position. Procedure Date: 02/25/2022. Aortic valve  area, by VTI measures 2.83 cm. Aortic valve  mean gradient measures 12.8 mmHg. Aortic valve Vmax measures 2.54 m/s.  Aortic valve acceleration time measures 78 msec.    EKG:  EKG is not ordered today.   Recent Labs: 02/21/2022: ALT 19 02/26/2022: BUN 20; Creatinine, Ser 1.03; Hemoglobin 11.6; Magnesium 2.0; Platelets 89; Potassium 4.1; Sodium 139  Recent Lipid Panel No results found for: "CHOL", "TRIG", "HDL", "CHOLHDL", "VLDL", "LDLCALC", "LDLDIRECT"  Physical Exam:    VS:  BP 110/60 (BP Location: Right Arm, Patient Position: Sitting, Cuff Size: Normal)   Pulse (!) 52   Ht 7' (2.134 m)   Wt 185 lb (83.9 kg)   HC 71.5" (181.6 cm)   SpO2 98%   BMI 18.43 kg/m     Wt Readings from Last 3 Encounters:  03/26/22 185 lb (83.9 kg)  03/05/22 190 lb 12.8 oz (86.5 kg)  02/26/22 183 lb 3.2 oz (83.1 kg)    General: Well developed, well nourished, NAD Neck: Negative for carotid bruits. No JVD Lungs:Clear to ausculation bilaterally. Breathing is unlabored. Cardiovascular: RRR with S1 S2. No murmurs Extremities: No edema. No clubbing or cyanosis. DP/PT pulses 2+ bilaterally Neuro: Alert and oriented. No focal deficits. No facial asymmetry. MAE spontaneously. Psych: Responds to questions appropriately with normal affect.    ASSESSMENT/PLAN:    Severe AS s/p TAVR: Continues to do great s/p TAVR with NYHA class I symptoms. Echocardiogram today with LVEF at 55% with mild to moderate PVL with a mean gradient at 80mHg, DI at 0.52 and AVA at 1.63cm2. PVL previously noted to be trivial. Will send to Dr. TAli Lowehowever anticipate that we will watch this with surveillance echos given no symptoms. He gets amoxicillin for SBE  prophylaxis from the dentist. Plan one year follow up with repeat echo and follow with primary cardiologist in 6 months.    PAF: Maintaining NSR/SB. Continue Eliquis '5mg'$  BID and Toprol '25mg'$  QD   HTN: Stable with no changes needed.   CAD: No complaints of chest pain. No ASA in the setting of Eliquis.Continue Omega 3/statin, beta blocker.   Medication Adjustments/Labs and Tests Ordered: Current medicines are reviewed at length with the patient today.  Concerns regarding medicines are outlined above.  No orders of the defined types were placed in this encounter.  No orders of the defined types were placed in this encounter.   Patient Instructions  Medication Instructions:  No changes *If you need a refill on your cardiac medications before your next appointment, please call your pharmacy*   Lab Work: none  Testing/Procedures: none   Follow-Up:  With Dr. OAudie Boxin 4-6 months and as planned ([place]        Signed, JKathyrn Drown NP  03/26/2022 2:10 PM    [place]

## 2022-03-26 ENCOUNTER — Ambulatory Visit (HOSPITAL_COMMUNITY): Payer: Medicare Other | Attending: Cardiology

## 2022-03-26 ENCOUNTER — Ambulatory Visit (INDEPENDENT_AMBULATORY_CARE_PROVIDER_SITE_OTHER): Payer: Medicare Other | Admitting: Cardiology

## 2022-03-26 ENCOUNTER — Other Ambulatory Visit: Payer: Self-pay | Admitting: Cardiology

## 2022-03-26 VITALS — BP 110/60 | HR 52 | Ht >= 80 in | Wt 185.0 lb

## 2022-03-26 DIAGNOSIS — Z952 Presence of prosthetic heart valve: Secondary | ICD-10-CM | POA: Diagnosis not present

## 2022-03-26 DIAGNOSIS — I350 Nonrheumatic aortic (valve) stenosis: Secondary | ICD-10-CM

## 2022-03-26 DIAGNOSIS — I480 Paroxysmal atrial fibrillation: Secondary | ICD-10-CM

## 2022-03-26 DIAGNOSIS — I2510 Atherosclerotic heart disease of native coronary artery without angina pectoris: Secondary | ICD-10-CM

## 2022-03-26 LAB — ECHOCARDIOGRAM COMPLETE
AR max vel: 1.63 cm2
AV Area VTI: 1.56 cm2
AV Area mean vel: 1.42 cm2
AV Mean grad: 12 mmHg
AV Peak grad: 20.3 mmHg
Ao pk vel: 2.25 m/s
Area-P 1/2: 3.06 cm2
MV M vel: 5.45 m/s
MV Peak grad: 118.8 mmHg
P 1/2 time: 616 msec
Radius: 0.6 cm
S' Lateral: 3.4 cm

## 2022-03-26 NOTE — Patient Instructions (Addendum)
Medication Instructions:  No changes *If you need a refill on your cardiac medications before your next appointment, please call your pharmacy*   Lab Work: none  Testing/Procedures: none   Follow-Up:  With Dr. Audie Box in 4-6 months and as planned (ONE YEAR) with [facility]

## 2022-03-31 ENCOUNTER — Ambulatory Visit (INDEPENDENT_AMBULATORY_CARE_PROVIDER_SITE_OTHER): Payer: Medicare Other | Admitting: Urology

## 2022-03-31 ENCOUNTER — Encounter: Payer: Self-pay | Admitting: Urology

## 2022-03-31 VITALS — BP 108/68 | HR 77 | Ht 71.5 in | Wt 183.0 lb

## 2022-03-31 DIAGNOSIS — R350 Frequency of micturition: Secondary | ICD-10-CM | POA: Diagnosis not present

## 2022-03-31 DIAGNOSIS — N529 Male erectile dysfunction, unspecified: Secondary | ICD-10-CM | POA: Diagnosis not present

## 2022-03-31 DIAGNOSIS — N401 Enlarged prostate with lower urinary tract symptoms: Secondary | ICD-10-CM

## 2022-03-31 DIAGNOSIS — I25119 Atherosclerotic heart disease of native coronary artery with unspecified angina pectoris: Secondary | ICD-10-CM

## 2022-03-31 DIAGNOSIS — N138 Other obstructive and reflux uropathy: Secondary | ICD-10-CM | POA: Diagnosis not present

## 2022-03-31 DIAGNOSIS — R310 Gross hematuria: Secondary | ICD-10-CM | POA: Diagnosis not present

## 2022-03-31 IMAGING — US US EXTREM LOW VENOUS*R*
1 series · 14 of 24 positions shown · non-contrast
Comparison: None.

CLINICAL DATA: Right lower extremity pain status post total knee
arthroplasty on 02/11/2021

EXAM:
RIGHT LOWER EXTREMITY VENOUS DOPPLER ULTRASOUND
TECHNIQUE: Gray-scale sonography with compression, as well as color and duplex
ultrasound, were performed to evaluate the deep venous system(s)
from the level of the common femoral vein through the popliteal and
proximal calf veins.

[Series 1: us venous img lower uni right (dvt) · portal-venous · 14 of 30 slices shown]
[im 1/30]
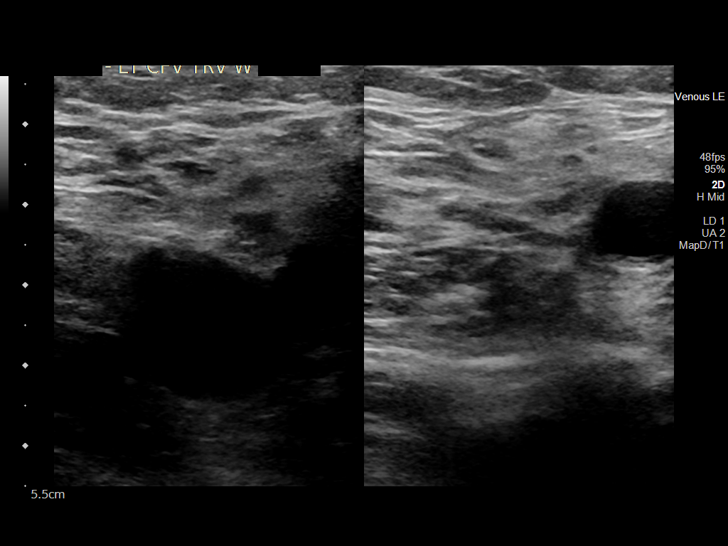
[im 3/30]
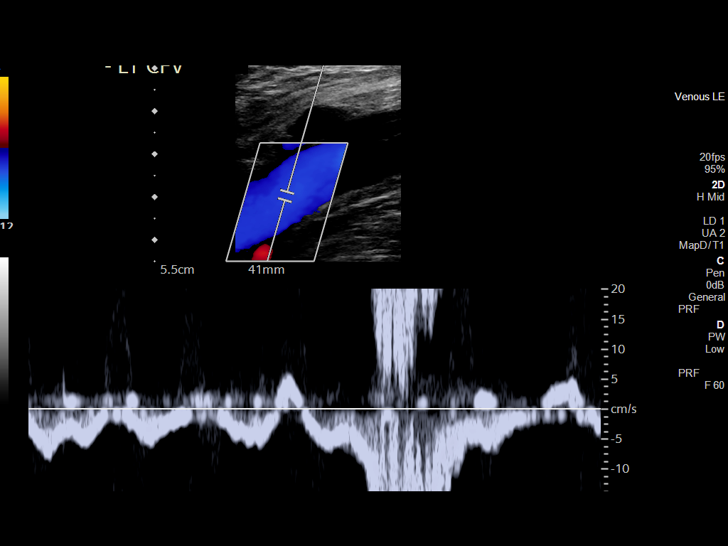
[im 6/30]
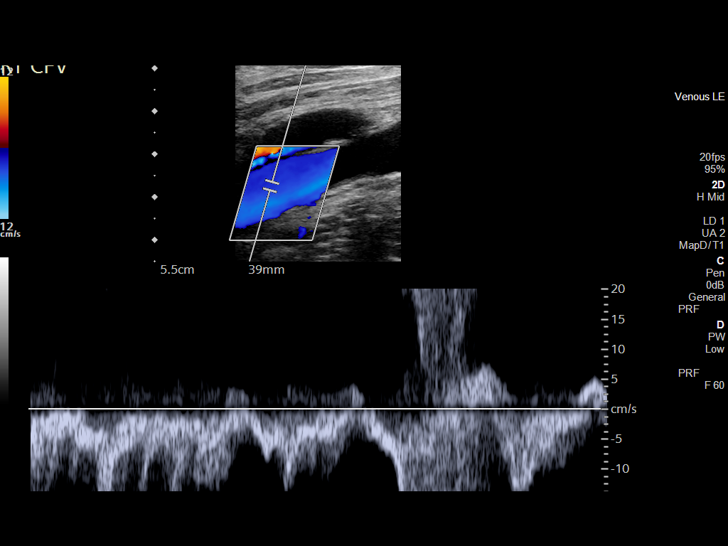
[im 8/30]
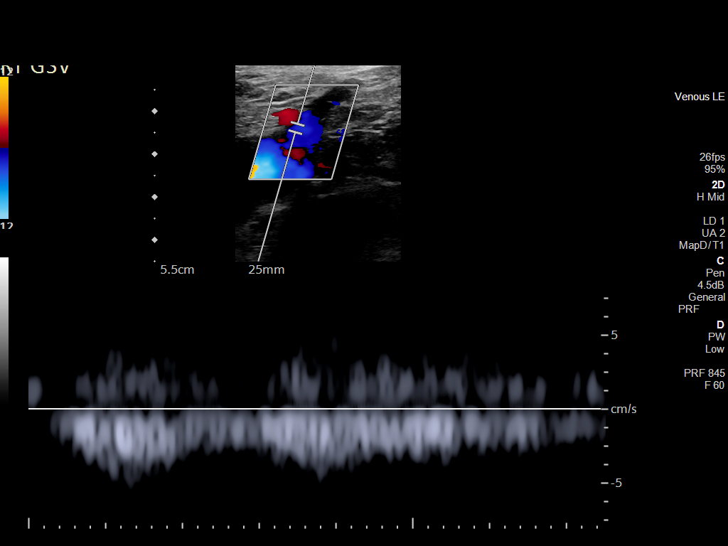
[im 9/30]
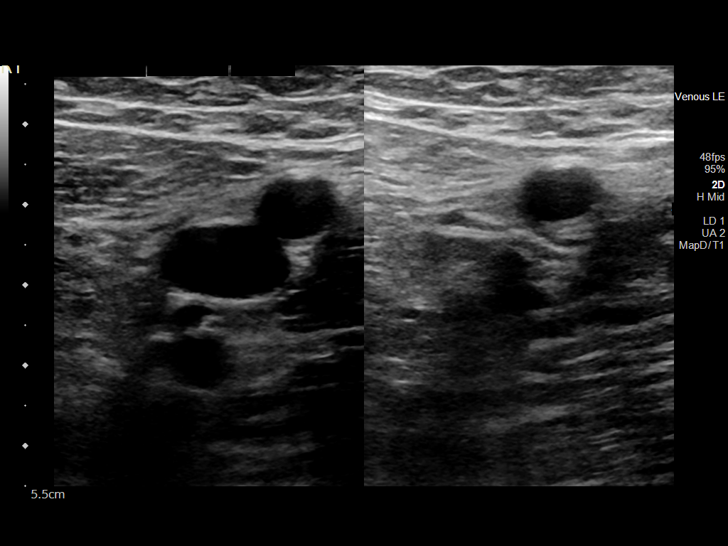
[im 12/30]
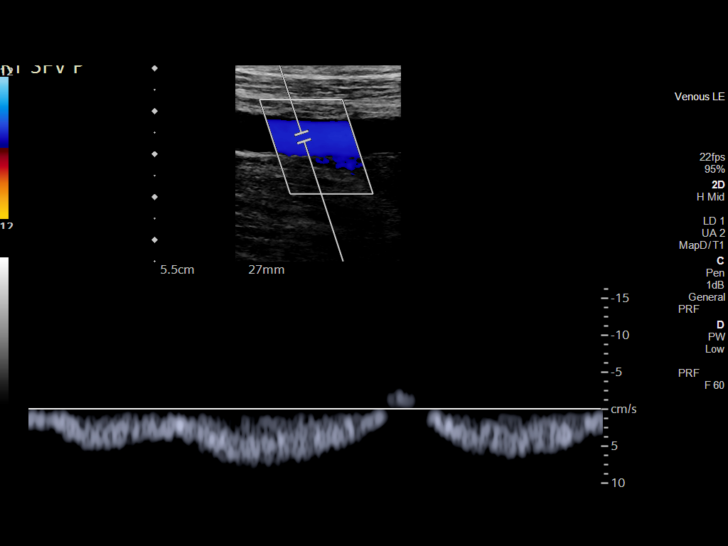
[im 14/30]
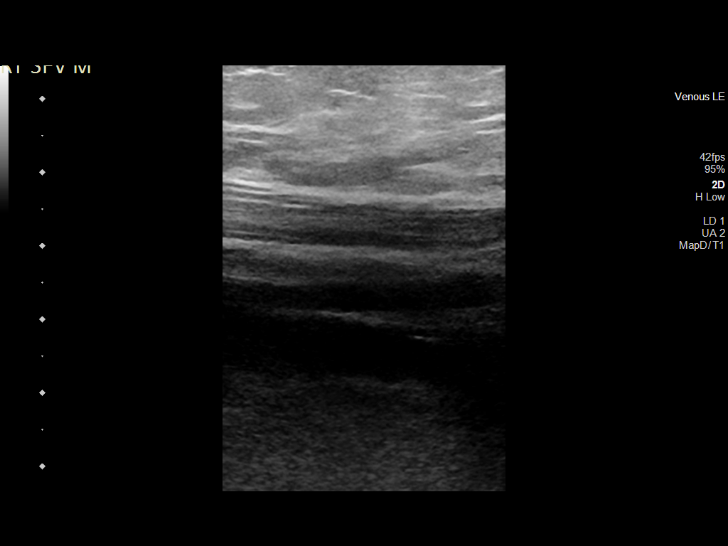
[im 16/30]
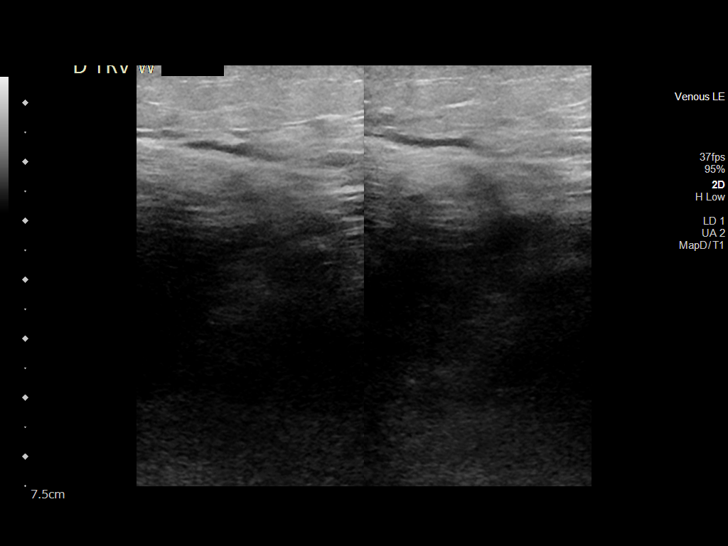
[im 18/30]
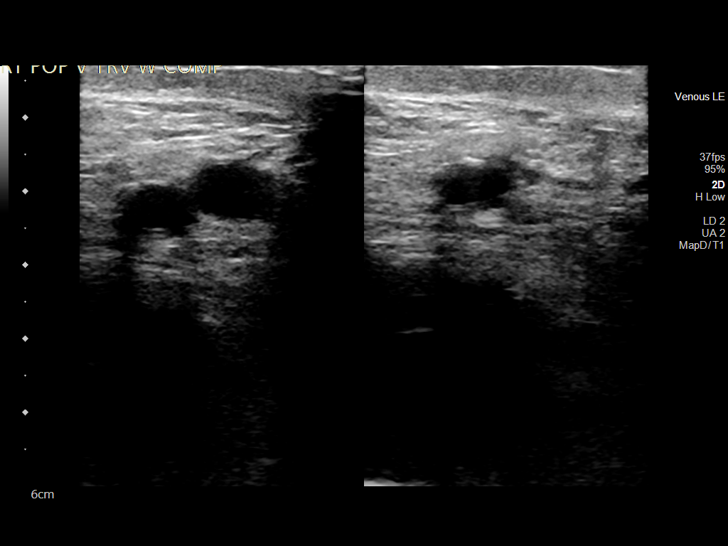
[im 21/30]
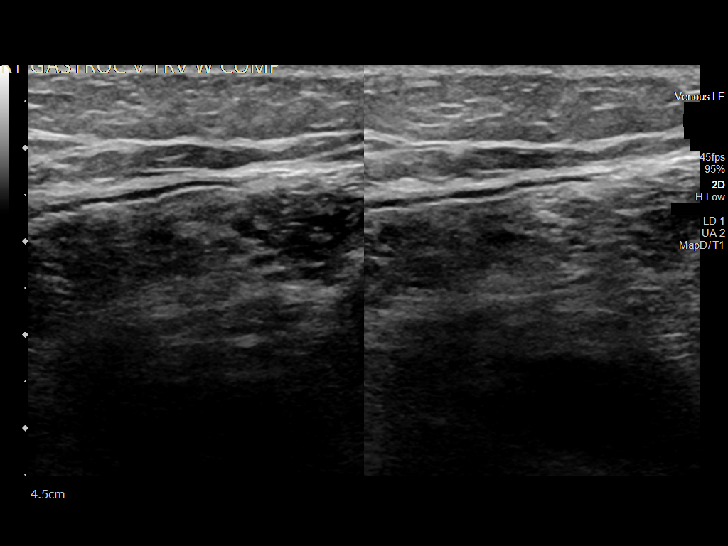
[im 23/30]
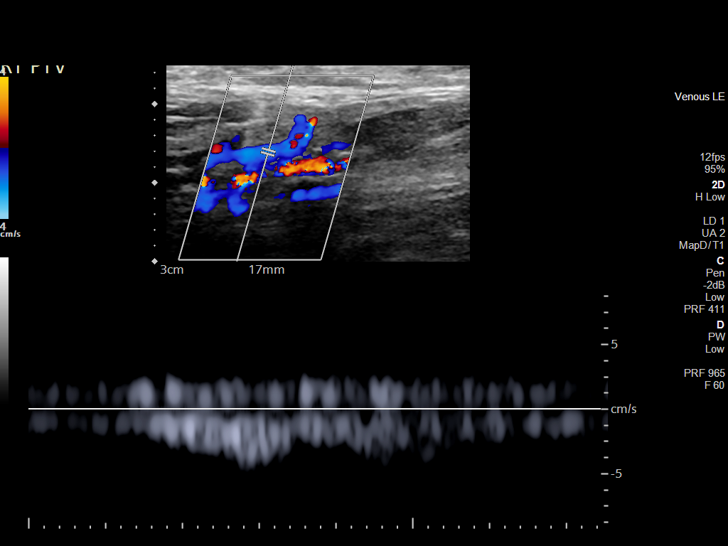
[im 24/30]
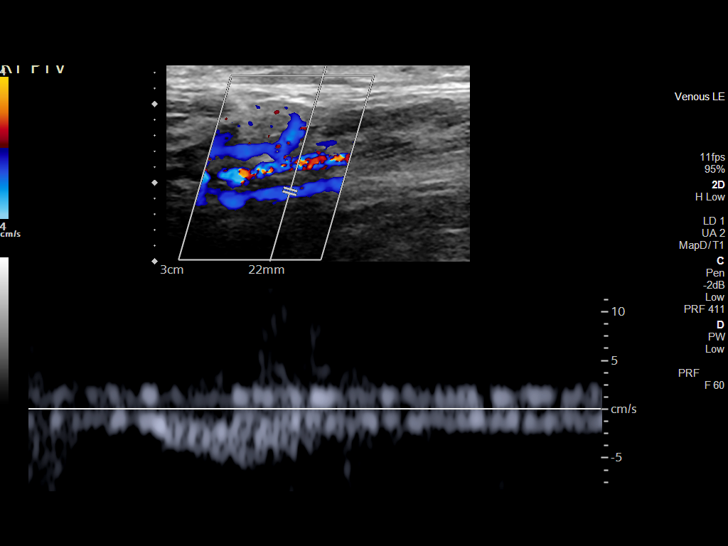
[im 27/30]
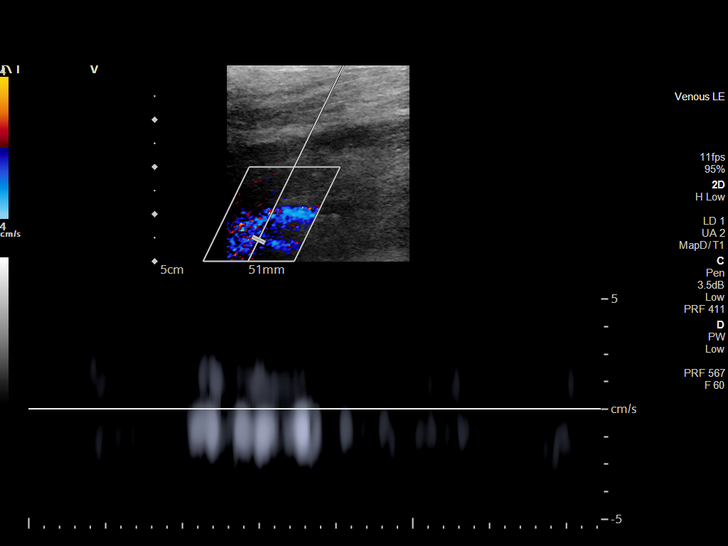
[im 30/30]
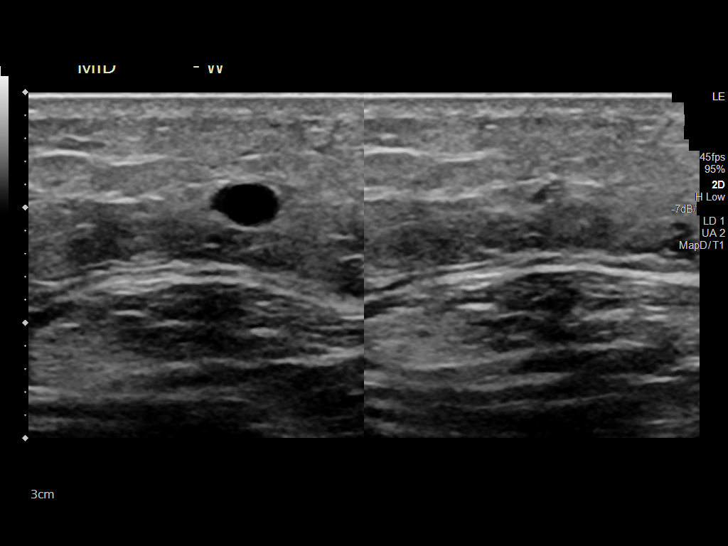

[14 of 24 positions shown; findings below may reference images not displayed]

FINDINGS: VENOUS

Normal compressibility of the common femoral, superficial femoral,
and popliteal veins, as well as the visualized calf veins.
Visualized portions of profunda femoral vein and great saphenous
vein unremarkable. No filling defects to suggest DVT on grayscale or
color Doppler imaging. Doppler waveforms show normal direction of
venous flow, normal respiratory plasticity and response to
augmentation.

Limited views of the contralateral common femoral vein are
unremarkable.

OTHER

None.

Limitations: none
IMPRESSION: No right lower extremity DVT

## 2022-03-31 IMAGING — CT CT HEAD W/O CM
4 series · 17 of 47 positions shown, 19 images · non-contrast
Comparison: None.

CLINICAL DATA: Dizziness, syncope.

EXAM:
CT HEAD WITHOUT CONTRAST
TECHNIQUE: Contiguous axial images were obtained from the base of the skull
through the vertex without intravenous contrast.

[Series 2: head wo · axial · 0.44mm/px · z∈[-359,-239]mm · 7 of 33 slices shown, 9 images]
[im 5/33  brain]
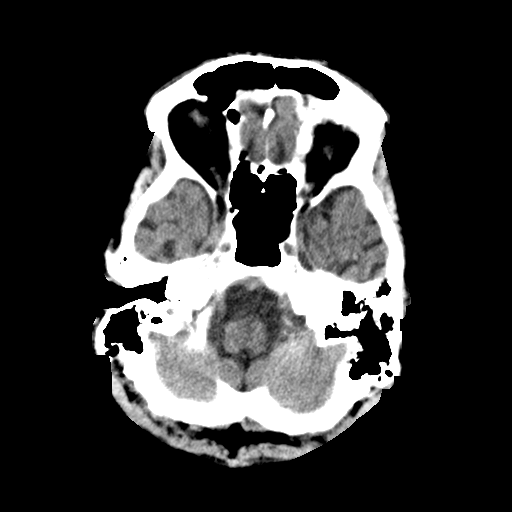
[im 5/33  bone]
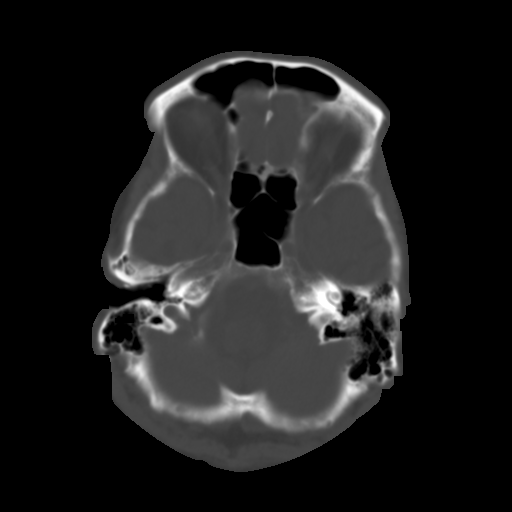
[im 9/33  brain]
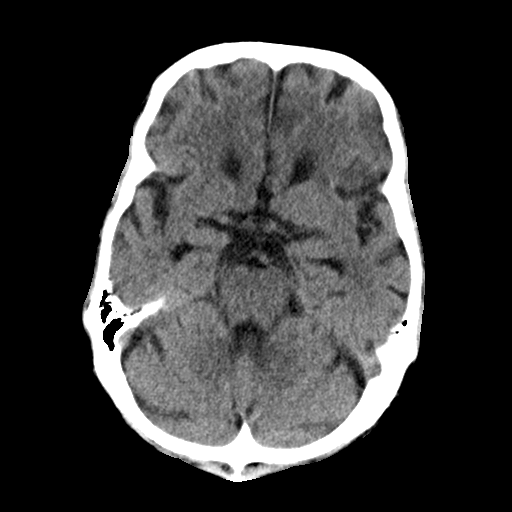
[im 13/33  brain]
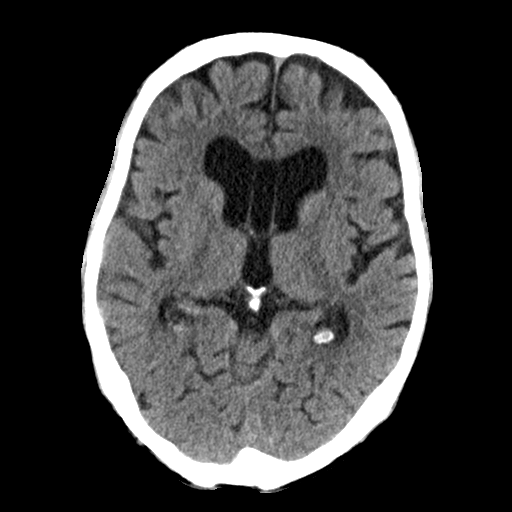
[im 17/33  brain]
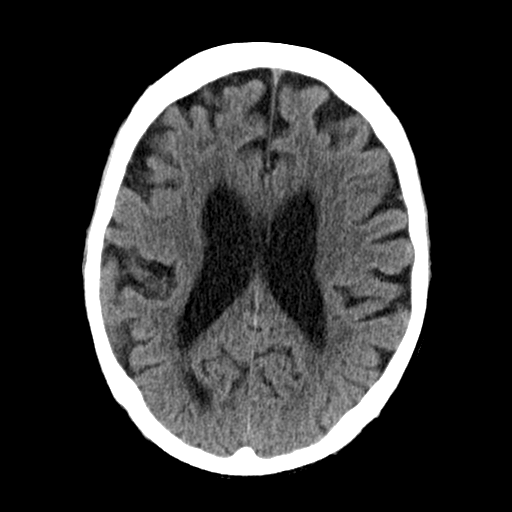
[im 21/33  brain]
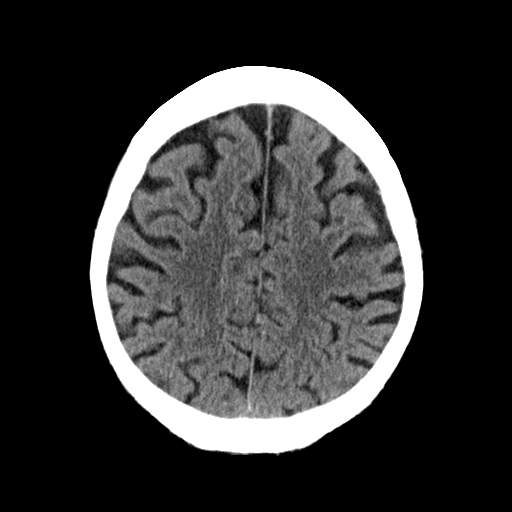
[im 21/33  bone]
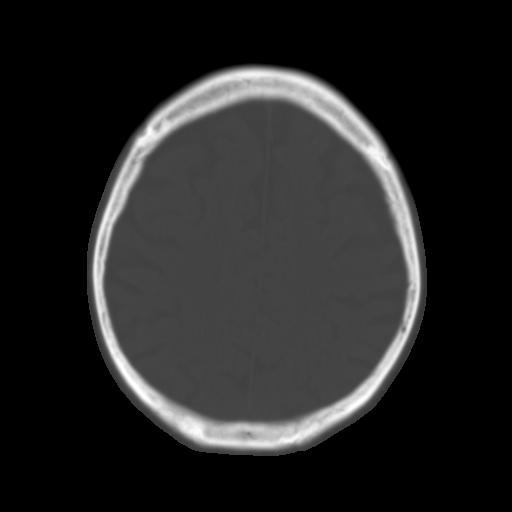
[im 25/33  brain]
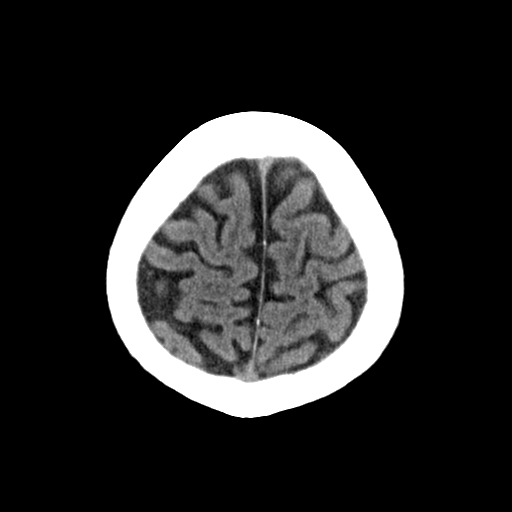
[im 29/33  brain]
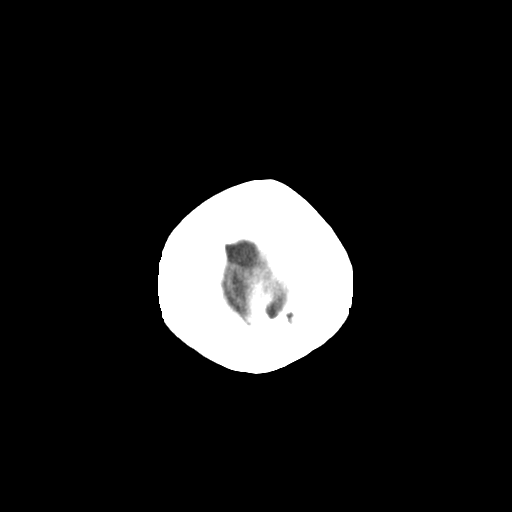

[Series 3: head bone · axial · 0.44mm/px · z∈[-363,-307]mm · 4 of 81 slices shown]
[im 9/81  bone]
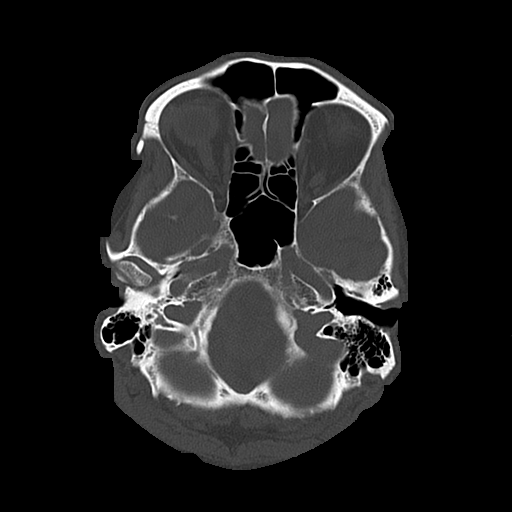
[im 17/81  bone]
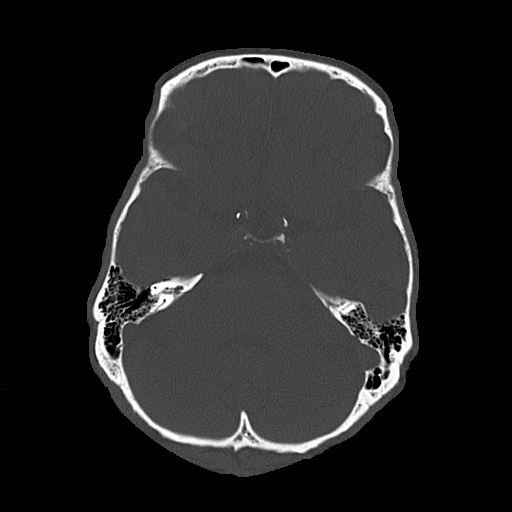
[im 25/81  bone]
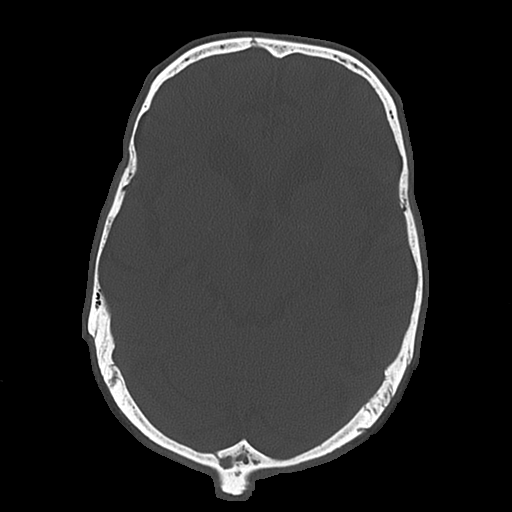
[im 37/81  bone]
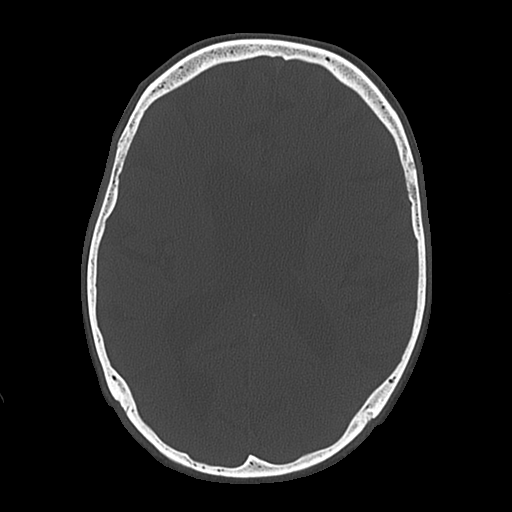

[Series 4: coronal soft · coronal · 0.35mm/px · 3 of 71 slices shown]
[im 24/71  brain]
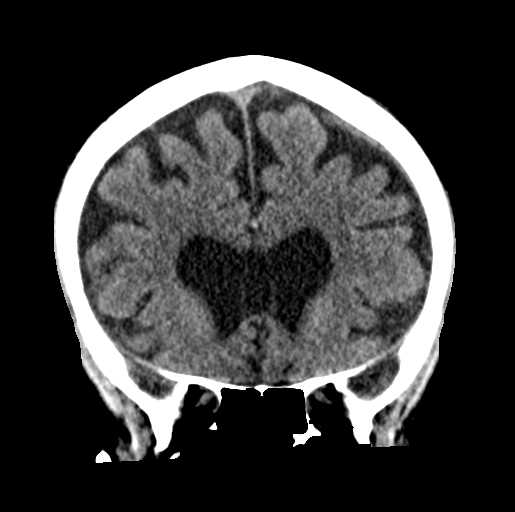
[im 32/71  brain]
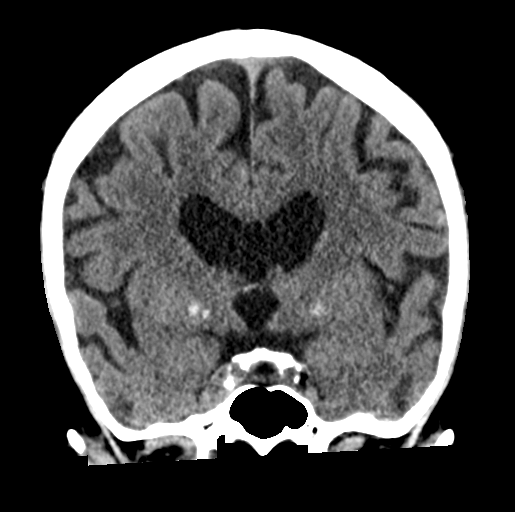
[im 39/71  brain]
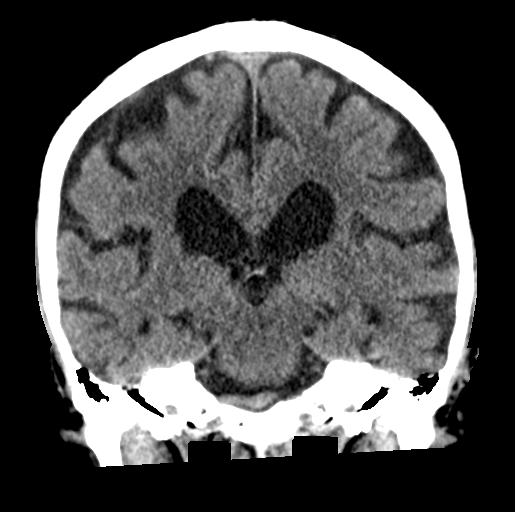

[Series 5: sagittal soft · sagittal · 0.35mm/px · 3 of 61 slices shown]
[im 21/61  brain]
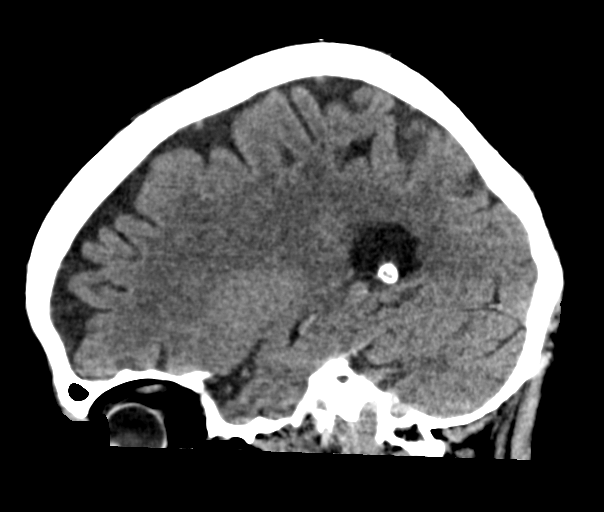
[im 31/61  brain]
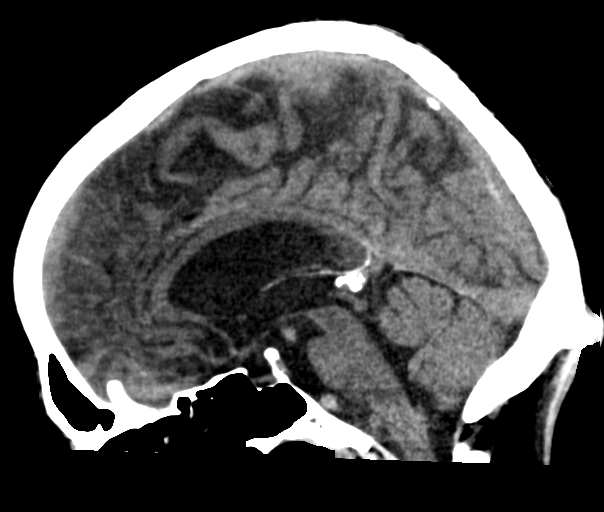
[im 41/61  brain]
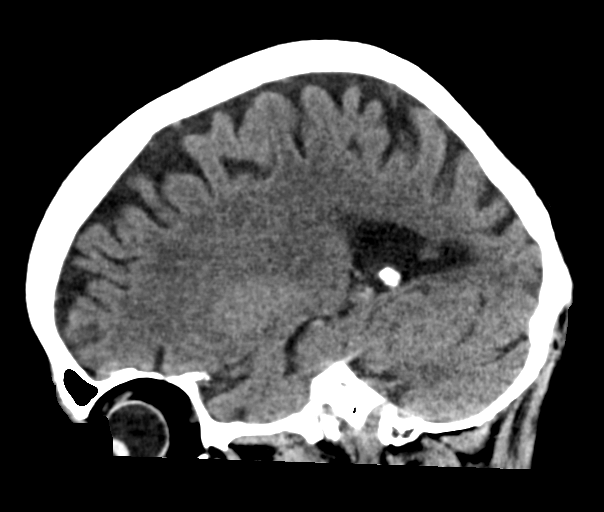

[17 of 47 positions shown; findings below may reference images not displayed]

FINDINGS: Brain: Mild diffuse cortical atrophy is noted. Mild chronic ischemic
white matter disease is noted. No mass effect or midline shift is
noted. Ventricular size is within normal limits. There is no
evidence of mass lesion, hemorrhage or acute infarction.

Vascular: No hyperdense vessel or unexpected calcification.

Skull: Normal. Negative for fracture or focal lesion.

Sinuses/Orbits: No acute finding.

Other: None.
IMPRESSION: No acute intracranial abnormality seen.

## 2022-03-31 MED ORDER — FINASTERIDE 5 MG PO TABS
5.0000 mg | ORAL_TABLET | Freq: Every day | ORAL | 3 refills | Status: DC
Start: 2022-03-31 — End: 2023-04-07

## 2022-03-31 MED ORDER — ALFUZOSIN HCL ER 10 MG PO TB24: 10.0000 mg | ORAL_TABLET | Freq: Every day | ORAL | 11 refills | Status: DC

## 2022-03-31 NOTE — Progress Notes (Signed)
03/31/2022 11:21 AM   [name] 1941-07-16 [id number]  Referring provider: [name], MD [place],  [place] [address]  BPH and ED   HPI: Mr Hochmuth is a 81yo here for followup for BPh and erectile dysfunction. IPSS 6 QOL 1 on uroxatral '10mg'$  qhs. Urine stream strong. Nocturia 0-1x. Urinary frequency every 3-4 hours. He is doing well after his TAVR. He has not been cleared to take PDE5s fro his erectile dysfunction. No gross hematuria since visit.    PMH: Past Medical History:  Diagnosis Date   Cancer [facility])    colon cancer and melanoma   Complication of anesthesia    difficult to wake up after surgery   Coronary artery disease    DJD (degenerative joint disease)    Dr. Noemi Chapel end stages of DJD 2011   Hard of hearing    History of hiatal hernia    PAF (paroxysmal atrial fibrillation) (HCC)    S/P TAVR (transcatheter aortic valve replacement) 02/25/2022   S3UR 61m via TF apprach with Dr. TAli Loweand Dr. BCyndia Bent  Severe aortic stenosis    Urinary frequency    UTI (lower urinary tract infection)     Surgical History: Past Surgical History:  Procedure Laterality Date   ANKLE SURGERY Right    COLONOSCOPY W/ BIOPSIES AND POLYPECTOMY     ELBOW SURGERY Right    EYE SURGERY     HERNIA REPAIR     INTRAOPERATIVE TRANSTHORACIC ECHOCARDIOGRAM N/A 02/25/2022   Procedure: INTRAOPERATIVE TRANSTHORACIC ECHOCARDIOGRAM;  Surgeon: TEarly Osmond MD;  [place] [facility];  Service: Open Heart Surgery;  Laterality: N/A;   JOINT REPLACEMENT     KNEE SURGERY Right    x4   LEFT HEART CATH AND CORONARY ANGIOGRAPHY N/A 09/12/2020   Procedure: LEFT HEART CATH AND CORONARY ANGIOGRAPHY;  Surgeon: CSherren Mocha MD;  [place] [facility];  Service: Cardiovascular;  Laterality: N/A;   MULTIPLE TOOTH EXTRACTIONS     RIGHT/LEFT HEART CATH AND CORONARY ANGIOGRAPHY N/A 01/23/2022   Procedure: RIGHT/LEFT HEART CATH AND CORONARY ANGIOGRAPHY;   Surgeon: CSherren Mocha MD;  [place];  Service: Cardiovascular;  Laterality: N/A;   TOTAL KNEE ARTHROPLASTY Right    TOTAL KNEE ARTHROPLASTY Left 06/18/2015   Procedure: TOTAL KNEE ARTHROPLASTY;  Surgeon: FFrederik Pear MD;  [place]  Service: Orthopedics;  Laterality: Left;   TOTAL KNEE REVISION Right 02/11/2021   Procedure: RIGHT TOTAL KNEE REVISION;  Surgeon: RFrederik Pear MD;  [facility];  Service: Orthopedics;  Laterality: Right;   TRANSCATHETER AORTIC VALVE REPLACEMENT, TRANSFEMORAL N/A 02/25/2022   Procedure: Transcatheter Aortic Valve Replacement, Transfemoral;  Surgeon: TEarly Osmond MD;  [place];  Service: Open Heart Surgery;  Laterality: N/A;    Home Medications:  Allergies as of 03/31/2022   No Known Allergies      Medication List        Accurate as of March 31, 2022 11:21 AM. If you have any questions, ask your nurse or doctor.          acetaminophen 650 MG CR tablet Commonly known as: TYLENOL Take 650 mg by mouth every 8 (eight) hours as needed for pain.   alfuzosin 10 MG 24 hr tablet Commonly known as: UROXATRAL Take 1 tablet (10 mg total) by mouth at bedtime.   amoxicillin 500 MG capsule Commonly known as: AMOXIL Take 2,000 mg by mouth See admin instructions.  Take 2000 mg 1 hour prior to dental work   atorvastatin 10 MG tablet Commonly known as: LIPITOR TAKE MON-WEN- AND FRIDAYS   cholecalciferol 25 MCG (1000 UNIT) tablet Commonly known as: VITAMIN D3 Take 1,000 Units by mouth daily.   cyanocobalamin 1000 MCG tablet Commonly known as: VITAMIN B12 Take 1,000 mcg by mouth daily.   doxycycline 100 MG tablet Commonly known as: VIBRA-TABS Take 100 mg by mouth daily. Continuous   Eliquis 5 MG Tabs tablet Generic drug: apixaban TAKE 1 TABLET BY MOUTH TWICE A DAY   finasteride 5 MG tablet Commonly known as: PROSCAR Take 1 tablet (5 mg total) by mouth daily at 12 noon.   Fish Oil 1000 MG  Caps Take 1,000 mg by mouth daily.   metoprolol succinate 25 MG 24 hr tablet Commonly known as: Toprol XL Take 1 tablet (25 mg total) by mouth daily.   valACYclovir 500 MG tablet Commonly known as: VALTREX Take 500 mg by mouth 2 (two) times daily as needed (Cold sore). BID for 5 days at onset of cold sore        Allergies: No Known Allergies  Family History: Family History  Problem Relation Age of Onset   Heart disease Father    Heart attack Mother        age 55   Breast cancer Sister    Breast cancer Sister     Social History:  reports that he has never smoked. He has never used smokeless tobacco. He reports that he does not drink alcohol and does not use drugs.  ROS: All other review of systems were reviewed and are negative except what is noted above in HPI  Physical Exam: BP 108/68   Pulse 77   Ht 5' 11.5" (1.816 m)   Wt 183 lb (83 kg)   BMI 25.17 kg/m   Constitutional:  Alert and oriented, No acute distress. HEENT: [place] AT, moist mucus membranes.  Trachea midline, no masses. Cardiovascular: No clubbing, cyanosis, or edema. Respiratory: Normal respiratory effort, no increased work of breathing. GI: Abdomen is soft, nontender, nondistended, no abdominal masses GU: No CVA tenderness.  Lymph: No cervical or inguinal lymphadenopathy. Skin: No rashes, bruises or suspicious lesions. Neurologic: Grossly intact, no focal deficits, moving all 4 extremities. Psychiatric: Normal mood and affect.  Laboratory Data: Lab Results  Component Value Date   WBC 5.7 02/26/2022   HGB 11.6 (L) 02/26/2022   HCT 34.0 (L) 02/26/2022   MCV 88.5 02/26/2022   PLT 89 (L) 02/26/2022    Lab Results  Component Value Date   CREATININE 1.03 02/26/2022    No results found for: "PSA"  No results found for: "TESTOSTERONE"  No results found for: "HGBA1C"  Urinalysis    Component Value Date/Time   COLORURINE YELLOW 02/21/2022 0922   APPEARANCEUR CLEAR 02/21/2022 0922    APPEARANCEUR Clear 09/09/2021 1212   LABSPEC 1.016 02/21/2022 0922   PHURINE 6.0 02/21/2022 0922   GLUCOSEU NEGATIVE 02/21/2022 0922   HGBUR NEGATIVE 02/21/2022 0922   BILIRUBINUR NEGATIVE 02/21/2022 0922   BILIRUBINUR Negative 09/09/2021 1212   KETONESUR NEGATIVE 02/21/2022 0922   PROTEINUR NEGATIVE 02/21/2022 0922   UROBILINOGEN 0.2 06/07/2015 0912   NITRITE NEGATIVE 02/21/2022 0922   LEUKOCYTESUR NEGATIVE 02/21/2022 0922    Lab Results  Component Value Date   LABMICR See below: 09/09/2021   WBCUA None seen 09/09/2021   LABEPIT None seen 09/09/2021   MUCUS Present 09/09/2021   BACTERIA None seen 09/09/2021  Pertinent Imaging:  No results found for this or any previous visit.  No results found for this or any previous visit.  No results found for this or any previous visit.  No results found for this or any previous visit.  No results found for this or any previous visit.  No results found for this or any previous visit.  No results found for this or any previous visit.  No results found for this or any previous visit.   Assessment & Plan:    1. Benign prostatic hyperplasia with urinary obstruction -uroxatral '10mg'$  qhs and finasteride '5mg'$   - Urinalysis, Routine w reflex microscopic  2. Gross hematuria -continue finasteride '5mg'$  daily  3. Urinary frequency -continue uroxatral '10mg'$  qhs  4. Erectile dysfunction, unspecified erectile dysfunction type -patient to inquire with cardiologist if it it safe to take PDE5s.   No follow-ups on file.  Nicolette Bang, MD  [facility] [place]

## 2022-03-31 NOTE — Patient Instructions (Signed)

## 2022-04-02 LAB — URINALYSIS, ROUTINE W REFLEX MICROSCOPIC
Bilirubin, UA: NEGATIVE
Glucose, UA: NEGATIVE
Ketones, UA: NEGATIVE
Leukocytes,UA: NEGATIVE
Nitrite, UA: NEGATIVE
Protein,UA: NEGATIVE
Specific Gravity, UA: 1.01 (ref 1.005–1.030)
Urobilinogen, Ur: 0.2 mg/dL (ref 0.2–1.0)
pH, UA: 5.5 (ref 5.0–7.5)

## 2022-04-02 LAB — MICROSCOPIC EXAMINATION: Bacteria, UA: NONE SEEN

## 2022-04-28 ENCOUNTER — Encounter (HOSPITAL_COMMUNITY): Payer: Self-pay

## 2022-04-28 ENCOUNTER — Encounter (HOSPITAL_COMMUNITY)
Admission: RE | Admit: 2022-04-28 | Discharge: 2022-04-28 | Disposition: A | Discharge status: Home / Self Care | Payer: Medicare Other | Source: Ambulatory Visit | Attending: Cardiovascular Disease | Admitting: Cardiovascular Disease

## 2022-04-28 VITALS — BP 92/58 | HR 57 | Ht 71.5 in | Wt 185.2 lb

## 2022-04-28 DIAGNOSIS — Z952 Presence of prosthetic heart valve: Secondary | ICD-10-CM | POA: Diagnosis not present

## 2022-04-28 NOTE — Progress Notes (Signed)
Cardiac Individual Treatment Plan  Patient Details  Name: Gerald Spears MRN: [id number] Date of Birth: [birth year] Referring Provider:   Flowsheet Row CARDIAC REHAB PHASE II ORIENTATION from 04/28/2022 in [facility]  Referring Provider Dr. Ali Lowe       Initial Encounter Date:  Flowsheet Row CARDIAC REHAB PHASE II ORIENTATION from 04/28/2022 in [place]  Date 04/28/22       Visit Diagnosis: Status post transcatheter aortic valve replacement  Patient's Home Medications on Admission:  Current Outpatient Medications:    acetaminophen (TYLENOL) 650 MG CR tablet, Take 650 mg by mouth every 8 (eight) hours as needed for pain., Disp: , Rfl:    alfuzosin (UROXATRAL) 10 MG 24 hr tablet, Take 1 tablet (10 mg total) by mouth at bedtime., Disp: 30 tablet, Rfl: 11   amoxicillin (AMOXIL) 500 MG capsule, Take 2,000 mg by mouth See admin instructions. Take 2000 mg 1 hour prior to dental work, Disp: , Rfl:    apixaban (ELIQUIS) 5 MG TABS tablet, TAKE 1 TABLET BY MOUTH TWICE A DAY, Disp: 180 tablet, Rfl: 1   atorvastatin (LIPITOR) 10 MG tablet, TAKE MON-WEN- AND FRIDAYS (Patient taking differently: Take 10 mg by mouth every Monday, Wednesday, and Friday.), Disp: 90 tablet, Rfl: 3   cholecalciferol (VITAMIN D3) 25 MCG (1000 UNIT) tablet, Take 1,000 Units by mouth daily., Disp: , Rfl:    doxycycline (VIBRA-TABS) 100 MG tablet, Take 100 mg by mouth daily. Continuous, Disp: , Rfl:    finasteride (PROSCAR) 5 MG tablet, Take 1 tablet (5 mg total) by mouth daily at 12 noon., Disp: 90 tablet, Rfl: 3   metoprolol succinate (TOPROL XL) 25 MG 24 hr tablet, Take 1 tablet (25 mg total) by mouth daily., Disp: 90 tablet, Rfl: 3   valACYclovir (VALTREX) 500 MG tablet, Take 500 mg by mouth 2 (two) times daily as needed (Cold sore). for 5 days at onset of cold sore, Disp: , Rfl: 2   vitamin B-12 (CYANOCOBALAMIN) 1000 MCG tablet, Take 1,000 mcg by mouth daily., Disp: ,  Rfl:    Omega-3 Fatty Acids (FISH OIL) 1000 MG CAPS, Take 1,000 mg by mouth daily., Disp: , Rfl:   Past Medical History: Past Medical History:  Diagnosis Date   Cancer ([place])    colon cancer and melanoma   Complication of anesthesia    difficult to wake up after surgery   Coronary artery disease    DJD (degenerative joint disease)    Dr. Noemi Chapel end stages of DJD 2011   Hard of hearing    History of hiatal hernia    PAF (paroxysmal atrial fibrillation) (HCC)    S/P TAVR (transcatheter aortic valve replacement) 02/25/2022   S3UR 45m via TF apprach with Dr. TAli Loweand Dr. BCyndia Bent  Severe aortic stenosis    Urinary frequency    UTI (lower urinary tract infection)     Tobacco Use: Social History   Tobacco Use  Smoking Status Never  Smokeless Tobacco Never    Labs: Review Flowsheet       Latest Ref Rng & Units 01/23/2022 02/25/2022  Labs for ITP Cardiac and Pulmonary Rehab  PH, Arterial 7.35 - 7.45 7.347  -  PCO2 arterial 32 - 48 mmHg 45.3  -  Bicarbonate 20.0 - 28.0 mmol/L 24.8  24.0  -  TCO2 22 - 32 mmol/L '26  25  25  24   '$ Acid-base deficit 0.0 - 2.0 mmol/L 1.0  2.0  -  O2 Saturation % 93  68  -    Capillary Blood Glucose: Lab Results  Component Value Date   GLUCAP 121 (H) 02/19/2021   GLUCAP 140 (H) 04/18/2010     Exercise Target Goals: Exercise Program Goal: Individual exercise prescription set using results from initial 6 min walk test and THRR while considering  patient's activity barriers and safety.   Exercise Prescription Goal: Starting with aerobic activity 30 plus minutes a day, 3 days per week for initial exercise prescription. Provide home exercise prescription and guidelines that participant acknowledges understanding prior to discharge.  Activity Barriers & Risk Stratification:  Activity Barriers & Cardiac Risk Stratification - 04/28/22 1246       Activity Barriers & Cardiac Risk Stratification   Activity Barriers Arthritis;Left Knee  Replacement;Right Knee Replacement;Joint Problems;Back Problems;Shortness of Breath;Assistive Device;Balance Concerns    Cardiac Risk Stratification High             6 Minute Walk:  6 Minute Walk     Row Name 04/28/22 1349         6 Minute Walk   Phase Initial     Distance 1400 feet     Walk Time 6 minutes     # of Rest Breaks 0     MPH 2.65     METS 2.29     RPE 11     VO2 Peak 8.01     Symptoms No     Resting HR 57 bpm     Resting BP 92/58     Resting Oxygen Saturation  98 %     Exercise Oxygen Saturation  during 6 min walk 98 %     Max Ex. HR 78 bpm     Max Ex. BP 110/58     2 Minute Post BP 96/58              Oxygen Initial Assessment:   Oxygen Re-Evaluation:   Oxygen Discharge (Final Oxygen Re-Evaluation):   Initial Exercise Prescription:  Initial Exercise Prescription - 04/28/22 1300       Date of Initial Exercise RX and Referring Provider   Date 04/28/22    Referring Provider Dr. Ali Lowe    Expected Discharge Date 07/18/22      Treadmill   MPH 2.4    Grade 0    Minutes 17      NuStep   Level 1    SPM 60    Minutes 22      Prescription Details   Frequency (times per week) 3    Duration Progress to 30 minutes of continuous aerobic without signs/symptoms of physical distress      Intensity   THRR 40-80% of Max Heartrate 55-110    Ratings of Perceived Exertion 11-13      Resistance Training   Training Prescription Yes    Weight 3    Reps 10-15             Perform Capillary Blood Glucose checks as needed.  Exercise Prescription Changes:   Exercise Comments:   Exercise Goals and Review:   Exercise Goals     Row Name 04/28/22 1352             Exercise Goals   Increase Physical Activity Yes       Intervention Provide advice, education, support and counseling about physical activity/exercise needs.;Develop an individualized exercise prescription for aerobic and resistive training based on initial evaluation  findings, risk stratification, comorbidities and participant's personal  goals.       Expected Outcomes Short Term: Attend rehab on a regular basis to increase amount of physical activity.;Long Term: Add in home exercise to make exercise part of routine and to increase amount of physical activity.;Long Term: Exercising regularly at least 3-5 days a week.       Increase Strength and Stamina Yes       Intervention Provide advice, education, support and counseling about physical activity/exercise needs.;Develop an individualized exercise prescription for aerobic and resistive training based on initial evaluation findings, risk stratification, comorbidities and participant's personal goals.       Expected Outcomes Short Term: Increase workloads from initial exercise prescription for resistance, speed, and METs.;Short Term: Perform resistance training exercises routinely during rehab and add in resistance training at home;Long Term: Improve cardiorespiratory fitness, muscular endurance and strength as measured by increased METs and functional capacity (6MWT)       Able to understand and use rate of perceived exertion (RPE) scale Yes       Intervention Provide education and explanation on how to use RPE scale       Expected Outcomes Short Term: Able to use RPE daily in rehab to express subjective intensity level;Long Term:  Able to use RPE to guide intensity level when exercising independently       Knowledge and understanding of Target Heart Rate Range (THRR) Yes       Intervention Provide education and explanation of THRR including how the numbers were predicted and where they are located for reference       Expected Outcomes Short Term: Able to state/look up THRR;Short Term: Able to use daily as guideline for intensity in rehab;Long Term: Able to use THRR to govern intensity when exercising independently       Able to check pulse independently Yes       Intervention Provide education and demonstration on how  to check pulse in carotid and radial arteries.;Review the importance of being able to check your own pulse for safety during independent exercise       Expected Outcomes Short Term: Able to explain why pulse checking is important during independent exercise;Long Term: Able to check pulse independently and accurately       Understanding of Exercise Prescription Yes       Intervention Provide education, explanation, and written materials on patient's individual exercise prescription       Expected Outcomes Short Term: Able to explain program exercise prescription;Long Term: Able to explain home exercise prescription to exercise independently                Exercise Goals Re-Evaluation :    Discharge Exercise Prescription (Final Exercise Prescription Changes):   Nutrition:  Target Goals: Understanding of nutrition guidelines, daily intake of sodium '1500mg'$ , cholesterol '200mg'$ , calories 30% from fat and 7% or less from saturated fats, daily to have 5 or more servings of fruits and vegetables.  Biometrics:  Pre Biometrics - 04/28/22 1353       Pre Biometrics   Height 5' 11.5" (1.816 m)    Weight 84 kg    Waist Circumference 37 inches    Hip Circumference 39.5 inches    Waist to Hip Ratio 0.94 %    BMI (Calculated) 25.47    Triceps Skinfold 12 mm    % Body Fat 24.6 %    Grip Strength 34.7 kg    Flexibility 0 in    Single Leg Stand 5 seconds  Nutrition Therapy Plan and Nutrition Goals:  Nutrition Therapy & Goals - 04/28/22 1341       Personal Nutrition Goals   Comments Patient scored 62 on his diet assessment. Handout provided and explained regarding heathier choices with he and his wife. We offer 2 educational sessions on heart healthy nutrition with handouts and assistance with RD referral if patient is interested.      Intervention Plan   Intervention Nutrition handout(s) given to patient.    Expected Outcomes Short Term Goal: Understand basic principles  of dietary content, such as calories, fat, sodium, cholesterol and nutrients.             Nutrition Assessments:  Nutrition Assessments - 04/28/22 1341       MEDFICTS Scores   Pre Score 62            MEDIFICTS Score Key: ?70 Need to make dietary changes  40-70 Heart Healthy Diet ? 40 Therapeutic Level Cholesterol Diet   Picture Your Plate Scores: <40 Unhealthy dietary pattern with much room for improvement. 41-50 Dietary pattern unlikely to meet recommendations for good health and room for improvement. 51-60 More healthful dietary pattern, with some room for improvement.  >60 Healthy dietary pattern, although there may be some specific behaviors that could be improved.    Nutrition Goals Re-Evaluation:   Nutrition Goals Discharge (Final Nutrition Goals Re-Evaluation):   Psychosocial: Target Goals: Acknowledge presence or absence of significant depression and/or stress, maximize coping skills, provide positive support system. Participant is able to verbalize types and ability to use techniques and skills needed for reducing stress and depression.  Initial Review & Psychosocial Screening:  Initial Psych Review & Screening - 04/28/22 1344       Initial Review   Current issues with None Identified      Family Dynamics   Good Support System? Yes      Barriers   Psychosocial barriers to participate in program Psychosocial barriers identified (see note)      Screening Interventions   Interventions Encouraged to exercise;Provide feedback about the scores to participant    Expected Outcomes Short Term goal: Identification and review with participant of any Quality of Life or Depression concerns found by scoring the questionnaire.             Quality of Life Scores:  Quality of Life - 04/28/22 1353       Quality of Life   Select Quality of Life      Quality of Life Scores   Health/Function Pre 22.93 %    Socioeconomic Pre 20.83 %    Psych/Spiritual Pre  24.29 %    Family Pre 26.4 %    GLOBAL Pre 23.36 %            Scores of 19 and below usually indicate a poorer quality of life in these areas.  A difference of  2-3 points is a clinically meaningful difference.  A difference of 2-3 points in the total score of the Quality of Life Index has been associated with significant improvement in overall quality of life, self-image, physical symptoms, and general health in studies assessing change in quality of life.  PHQ-9: Review Flowsheet       04/28/2022 03/12/2011  Depression screen PHQ 2/9  Decreased Interest 0 1  Down, Depressed, Hopeless 0 1  PHQ - 2 Score 0 2  Altered sleeping 0 -  Tired, decreased energy 0 -  Change in appetite 0 -  Feeling bad  or failure about yourself  0 -  Trouble concentrating 0 -  Moving slowly or fidgety/restless 0 -  Suicidal thoughts 0 -  PHQ-9 Score 0 -  Difficult doing work/chores Not difficult at all -   Interpretation of Total Score  Total Score Depression Severity:  1-4 = Minimal depression, 5-9 = Mild depression, 10-14 = Moderate depression, 15-19 = Moderately severe depression, 20-27 = Severe depression   Psychosocial Evaluation and Intervention:  Psychosocial Evaluation - 04/28/22 1347       Psychosocial Evaluation & Interventions   Interventions Encouraged to exercise with the program and follow exercise prescription;Stress management education;Relaxation education    Comments Patient has no psychosocial barriers or issues identified at his orientation visit. He scored 0 on his PHQ-9. He denies any depression or anxiety. His wife was with him today. 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 They have a daughter and several grandchildren and 1 great grandchild. He names his wife as his support person. They live in [place] and [place] [facility] was closer but he wanted to get started before they could see him. He is ready to start  the program hoping to get back to his activities especially playing 18 holes of golf.    Expected Outcomes Patient will continue to have no psychosocial barriers or issues identified.    Continue Psychosocial Services  No Follow up required             Psychosocial Re-Evaluation:   Psychosocial Discharge (Final Psychosocial Re-Evaluation):   Vocational Rehabilitation: Provide vocational rehab assistance to qualifying candidates.   Vocational Rehab Evaluation & Intervention:  Vocational Rehab - 04/28/22 1343       Initial Vocational Rehab Evaluation & Intervention   Assessment shows need for Vocational Rehabilitation No      Vocational Rehab Re-Evaulation   Comments Patient is retired and does not need vocational rehab.             Education: Education Goals: Education classes will be provided on a weekly basis, covering required topics. Participant will state understanding/return demonstration of topics presented.  Learning Barriers/Preferences:  Learning Barriers/Preferences - 04/28/22 1342       Learning Barriers/Preferences   Learning Barriers None    Learning Preferences Written Material;Skilled Demonstration             Education Topics: Hypertension, Hypertension Reduction -Define heart disease and high blood pressure. Discus how high blood pressure affects the body and ways to reduce high blood pressure.   Exercise and Your Heart -Discuss why it is important to exercise, the FITT principles of exercise, normal and abnormal responses to exercise, and how to exercise safely.   Angina -Discuss definition of angina, causes of angina, treatment of angina, and how to decrease risk of having angina.   Cardiac Medications -Review what the following cardiac medications are used for, how they affect the body, and side effects that may occur when taking the medications.  Medications include Aspirin, Beta blockers, calcium channel blockers, ACE Inhibitors,  angiotensin receptor blockers, diuretics, digoxin, and antihyperlipidemics.   Congestive Heart Failure -Discuss the definition of CHF, how to live with CHF, the signs and symptoms of CHF, and how keep track of weight and sodium intake.   Heart Disease and Intimacy -Discus the effect sexual activity has on the heart, how changes occur during intimacy as we age, and safety during sexual activity.   Smoking Cessation /  COPD -Discuss different methods to quit smoking, the health benefits of quitting smoking, and the definition of COPD.   Nutrition I: Fats -Discuss the types of cholesterol, what cholesterol does to the heart, and how cholesterol levels can be controlled.   Nutrition II: Labels -Discuss the different components of food labels and how to read food label   Heart Parts/Heart Disease and PAD -Discuss the anatomy of the heart, the pathway of blood circulation through the heart, and these are affected by heart disease.   Stress I: Signs and Symptoms -Discuss the causes of stress, how stress may lead to anxiety and depression, and ways to limit stress.   Stress II: Relaxation -Discuss different types of relaxation techniques to limit stress.   Warning Signs of Stroke / TIA -Discuss definition of a stroke, what the signs and symptoms are of a stroke, and how to identify when someone is having stroke.   Knowledge Questionnaire Score:  Knowledge Questionnaire Score - 04/28/22 1342       Knowledge Questionnaire Score   Pre Score 21/24             Core Components/Risk Factors/Patient Goals at Admission:  Personal Goals and Risk Factors at Admission - 04/28/22 1343       Core Components/Risk Factors/Patient Goals on Admission    Weight Management Weight Maintenance    Improve shortness of breath with ADL's Yes    Intervention Provide education, individualized exercise plan and daily activity instruction to help decrease symptoms of SOB with activities of daily  living.    Expected Outcomes Short Term: Improve cardiorespiratory fitness to achieve a reduction of symptoms when performing ADLs;Long Term: Be able to perform more ADLs without symptoms or delay the onset of symptoms    Personal Goal Other Yes    Personal Goal Patient wants to improve his strength and stamina and be able to play 18 holes of golf.    Intervention Patient will attend [facility] 3 days/week with exercise and education.    Expected Outcomes Patient will complete the program meeting both personal and program.             Core Components/Risk Factors/Patient Goals Review:    Core Components/Risk Factors/Patient Goals at Discharge (Final Review):    ITP Comments:   Comments: Patient arrived for 1st visit/orientation/education at 1230. Patient was referred to [facility] by Eleonore Chiquito due to S/P TAVR (Z95.2). During orientation advised patient on arrival and appointment times what to wear, what to do before, during and after exercise. Reviewed attendance and class policy.  Pt is scheduled to return Cardiac Rehab on 04/30/22 at 815. Pt was advised to come to class 15 minutes before class starts.  Discussed RPE/Dpysnea scales. Patient participated in warm up stretches. Patient was able to complete 6 minute walk test.  Telemetry:NSR with PAC's. Patient was measured for the equipment. Discussed equipment safety with patient. Took patient pre-anthropometric measurements. Patient finished visit at 1350.

## 2022-04-30 ENCOUNTER — Encounter (HOSPITAL_COMMUNITY)
Admission: RE | Admit: 2022-04-30 | Discharge: 2022-04-30 | Disposition: A | Discharge status: Home / Self Care | Payer: Medicare Other | Source: Ambulatory Visit | Attending: Cardiovascular Disease | Admitting: Cardiovascular Disease

## 2022-04-30 DIAGNOSIS — Z952 Presence of prosthetic heart valve: Secondary | ICD-10-CM

## 2022-04-30 NOTE — Progress Notes (Signed)
Daily Session Note  Patient Details  Name: Gerald Spears MRN: [id number] Date of Birth: 1941/06/13 Referring Provider:   Flowsheet Row CARDIAC REHAB PHASE II ORIENTATION from 04/28/2022 in [facility]  Referring Provider Dr. Ali Lowe       Encounter Date: 04/30/2022  Check In:  Session Check In - 04/30/22 0815       Check-In   Supervising physician immediately available to respond to emergencies [facility] MD immediately available    Physician(s) Dr. Domenic Polite    Location [facility]    Staff Present Daphyne Hassell Done, RN, BSN;Loni Abdon Otho Ket, BS, Exercise Physiologist    Virtual Visit No    Medication changes reported     No    Fall or balance concerns reported    Yes    Comments Patient reports losing his balance at times. He uses a cane sometimes at home.    Tobacco Cessation No Change    Warm-up and Cool-down Performed as group-led instruction    Resistance Training Performed Yes    VAD Patient? No    PAD/SET Patient? No      Pain Assessment   Currently in Pain? No/denies    Pain Score 0-No pain    Multiple Pain Sites No             Capillary Blood Glucose: No results found for this or any previous visit (from the past 24 hour(s)).    Social History   Tobacco Use  Smoking Status Never  Smokeless Tobacco Never    Goals Met:  Independence with exercise equipment Exercise tolerated well No report of concerns or symptoms today Strength training completed today  Goals Unmet:  Not Applicable  Comments: check out 0915   Dr. Carlyle Dolly is Medical Director for [facility]

## 2022-05-02 ENCOUNTER — Encounter (HOSPITAL_COMMUNITY)
Admission: RE | Admit: 2022-05-02 | Discharge: 2022-05-02 | Disposition: A | Discharge status: Home / Self Care | Payer: Medicare Other | Source: Ambulatory Visit | Attending: Cardiovascular Disease | Admitting: Cardiovascular Disease

## 2022-05-02 DIAGNOSIS — Z952 Presence of prosthetic heart valve: Secondary | ICD-10-CM

## 2022-05-02 NOTE — Progress Notes (Signed)
Daily Session Note  Patient Details  Name: Gerald Spears MRN: [id number] Date of Birth: 04/03/1941 Referring Provider:   Flowsheet Row CARDIAC REHAB PHASE II ORIENTATION from 04/28/2022 in [place]  Referring Provider Dr. Ali Lowe       Encounter Date: 05/02/2022  Check In:  Session Check In - 05/02/22 0808       Check-In   Supervising physician immediately available to respond to emergencies [facility] MD immediately available    Physician(s) Dr Gasper Sells    Location [facility]    Staff Present Aundra Dubin, RN, Joanette Gula, RN, Bjorn Loser, MS, ACSM-CEP, Exercise Physiologist    Virtual Visit No    Medication changes reported     No    Fall or balance concerns reported    Yes    Comments Patient reports losing his balance at times. He uses a cane sometimes at home.    Tobacco Cessation No Change    Warm-up and Cool-down Performed as group-led instruction    Resistance Training Performed Yes    VAD Patient? No    PAD/SET Patient? No      Pain Assessment   Currently in Pain? No/denies    Pain Score 0-No pain    Multiple Pain Sites No             Capillary Blood Glucose: No results found for this or any previous visit (from the past 24 hour(s)).    Social History   Tobacco Use  Smoking Status Never  Smokeless Tobacco Never    Goals Met:  Independence with exercise equipment Exercise tolerated well No report of concerns or symptoms today Strength training completed today  Goals Unmet:  Not Applicable  Comments: Checkout at 0915.   Dr. Carlyle Dolly is Medical Director for [facility]

## 2022-05-05 ENCOUNTER — Encounter (HOSPITAL_COMMUNITY): Admission: RE | Admit: 2022-05-05 | Payer: Medicare Other | Source: Ambulatory Visit

## 2022-05-05 ENCOUNTER — Telehealth: Payer: Self-pay | Admitting: Cardiology

## 2022-05-05 DIAGNOSIS — R42 Dizziness and giddiness: Secondary | ICD-10-CM

## 2022-05-05 NOTE — Telephone Encounter (Signed)
STAT if patient feels like he/she is going to faint   Are you dizzy now? Not with him  Do you feel faint or have you passed out? no  Do you have any other symptoms? no  Have you checked your HR and BP (record if available)? Not sure, but can start checking it  Patient's wife states the patient has been dizzy, lightheaded, and loses his balance. She says she thinks it may be do to his medications. She says he usually has low BP and was given a beta blocker. She says his dizziness has not improved since the heart valve replacement

## 2022-05-05 NOTE — Telephone Encounter (Signed)
Geralynn Rile, MD  You; Caprice Beaver, LPN 1 hour ago (85:27 PM)    Would stop metoprolol.    Spoke with pt's wife with Dr. Kathalene Frames recommendations. Metoprolol removed from pt's med list. Instructed to keep appointment made for Thursday. Wife verbalizes understanding.

## 2022-05-05 NOTE — Telephone Encounter (Signed)
Lets mail him a heart monitor (zio xt 14days) and stop his Toprol for now. Thank you   KT         Pt/wife have already been informed to stop Toprol.  Will come to [employer] tomorrow for [employer] monitor to be placed.  Aware to keep 9/21 appointment with Dr. Audie Box.

## 2022-05-05 NOTE — Addendum Note (Signed)
Addended by: Rodman Key on: 05/05/2022 03:17 PM   Modules accepted: Orders

## 2022-05-05 NOTE — Telephone Encounter (Signed)
Spoke with pt's wife, Gerald Spears (ok per [facility]) regarding pt feeling dizzy, sluggish, has no energy and is foggy at times. Wife states that she thinks it might have to do with his medication metoprolol succinate '25mg'$  once daily. She states that back in May this medication was decreased to only once a day per Dr. Burt Knack. Wife states that she has also tried giving medication at night to see if that has an impact. Pt did have TAVR procedure done back in July. Wife states that pt has dealt with low heart rate and blood pressure for awhile now. Wife states that they do not routinely take his blood pressure and heart rate. Suggested that they start keeping a blood pressure log. Made appointment for pt to come in a discuss symptoms and possibly get lab work. Wife verbalizes understanding.

## 2022-05-06 ENCOUNTER — Ambulatory Visit: Payer: Medicare Other | Attending: Interventional Cardiology

## 2022-05-06 DIAGNOSIS — R42 Dizziness and giddiness: Secondary | ICD-10-CM | POA: Insufficient documentation

## 2022-05-06 DIAGNOSIS — D3132 Benign neoplasm of left choroid: Secondary | ICD-10-CM | POA: Diagnosis not present

## 2022-05-06 DIAGNOSIS — H2513 Age-related nuclear cataract, bilateral: Secondary | ICD-10-CM | POA: Diagnosis not present

## 2022-05-06 NOTE — Progress Notes (Unsigned)
ZIO XT serial # [id number] from office inventory applied to patient.  Dr. Eleonore Chiquito to read.

## 2022-05-07 ENCOUNTER — Encounter (HOSPITAL_COMMUNITY)
Admission: RE | Admit: 2022-05-07 | Discharge: 2022-05-07 | Disposition: A | Discharge status: Home / Self Care | Payer: Medicare Other | Source: Ambulatory Visit | Attending: Cardiovascular Disease | Admitting: Cardiovascular Disease

## 2022-05-07 DIAGNOSIS — Z952 Presence of prosthetic heart valve: Secondary | ICD-10-CM | POA: Diagnosis not present

## 2022-05-07 NOTE — Progress Notes (Signed)
Daily Session Note  Patient Details  Name: Gerald Spears MRN: [id number] Date of Birth: [birth year] Referring Provider:   Flowsheet Row CARDIAC REHAB PHASE II ORIENTATION from 04/28/2022 in [place]  Referring Provider Dr. Ali Lowe       Encounter Date: 05/07/2022  Check In:  Session Check In - 05/07/22 0811       Check-In   Supervising physician immediately available to respond to emergencies [facility] MD immediately available    Physician(s) Dr Marlou Porch    Location [facility]    Staff Present Curt Oatis Hassell Done, RN, Bjorn Loser, MS, ACSM-CEP, Exercise Physiologist    Virtual Visit No    Medication changes reported     No    Fall or balance concerns reported    Yes    Comments Patient reports losing his balance at times. He uses a cane sometimes at home.    Tobacco Cessation No Change    Warm-up and Cool-down Performed as group-led instruction    Resistance Training Performed Yes    VAD Patient? No    PAD/SET Patient? No      Pain Assessment   Currently in Pain? No/denies    Pain Score 0-No pain    Multiple Pain Sites No             Capillary Blood Glucose: No results found for this or any previous visit (from the past 24 hour(s)).    Social History   Tobacco Use  Smoking Status Never  Smokeless Tobacco Never    Goals Met:  Independence with exercise equipment Exercise tolerated well No report of concerns or symptoms today Strength training completed today  Goals Unmet:  Not Applicable  Comments: Checkout at 0915.   Dr. Carlyle Dolly is Medical Director for [facility]

## 2022-05-08 ENCOUNTER — Ambulatory Visit: Payer: Medicare Other | Attending: Cardiovascular Disease | Admitting: Cardiovascular Disease

## 2022-05-08 ENCOUNTER — Encounter: Payer: Self-pay | Admitting: Cardiovascular Disease

## 2022-05-08 VITALS — BP 115/64 | HR 50 | Ht 72.0 in | Wt 188.6 lb

## 2022-05-08 DIAGNOSIS — R0602 Shortness of breath: Secondary | ICD-10-CM | POA: Diagnosis not present

## 2022-05-08 DIAGNOSIS — R42 Dizziness and giddiness: Secondary | ICD-10-CM | POA: Diagnosis not present

## 2022-05-08 NOTE — Progress Notes (Signed)
Cardiology Office Note:   Date:  05/08/2022  NAME:  Gerald Spears    MRN: [id number] DOB:  22-Jan-1941   PCP:  [name], MD  Cardiologist:  Evalina Field, MD  Electrophysiologist:  None   Referring MD: [name], MD   Chief Complaint  Patient presents with   Follow-up         History of Present Illness:   Gerald Spears is a 81 y.o. male with a hx of severe aortic stenosis status post TAVR, persistent atrial fibrillation, CAD who presents for follow-up.  He reports feeling dizzy and lightheaded.  Apparently had a bad episode on Saturday.  Felt he was going to pass out.  He called the office.  We stopped his metoprolol.  Symptoms are improved.  He is maintaining sinus rhythm.  EKG demonstrates sinus bradycardia.  Suspect bradycardia could have been contributing.  No further A-fib.  Doing well.  No bleeding.  Recent echocardiogram shows stable TAVR.  Overall doing much better off metoprolol.  Has a monitor which she will complete.  Problem List 1. Severe aortic stenosis -26 mm S3 TAVR 02/25/2022 2. Mitral regurgitation, moderate -09/27/2018 3. CAD -CAC score 682 (62nd percentile) -Apical ischemia 08/29/2020 -LHC 09/12/2020: D2 80% 4. HLD -T chol 180, LDL 106, HDL 61, TG 71 5. PACs -5.8% burden -brief ectopic atrial tachycardia  6. Atrial fibrillation, persistent 02/19/2021 -converted to NSR in ER -CHADSVASC= 3 (age, CAD)  Past Medical History: Past Medical History:  Diagnosis Date   Cancer ([place])    colon cancer and melanoma   Complication of anesthesia    difficult to wake up after surgery   Coronary artery disease    DJD (degenerative joint disease)    Dr. Noemi Chapel end stages of DJD 2011   Hard of hearing    History of hiatal hernia    PAF (paroxysmal atrial fibrillation) (HCC)    S/P TAVR (transcatheter aortic valve replacement) 02/25/2022   S3UR 82m via TF apprach with Dr. TAli Loweand Dr. BCyndia Bent  Severe aortic stenosis    Urinary frequency     UTI (lower urinary tract infection)     Past Surgical History: Past Surgical History:  Procedure Laterality Date   ANKLE SURGERY Right    COLONOSCOPY W/ BIOPSIES AND POLYPECTOMY     ELBOW SURGERY Right    EYE SURGERY     HERNIA REPAIR     INTRAOPERATIVE TRANSTHORACIC ECHOCARDIOGRAM N/A 02/25/2022   Procedure: INTRAOPERATIVE TRANSTHORACIC ECHOCARDIOGRAM;  Surgeon: TEarly Osmond MD;  [facility];  Service: Open Heart Surgery;  Laterality: N/A;   JOINT REPLACEMENT     KNEE SURGERY Right    x4   LEFT HEART CATH AND CORONARY ANGIOGRAPHY N/A 09/12/2020   Procedure: LEFT HEART CATH AND CORONARY ANGIOGRAPHY;  Surgeon: CSherren Mocha MD;  [facility];  Service: Cardiovascular;  Laterality: N/A;   MULTIPLE TOOTH EXTRACTIONS     RIGHT/LEFT HEART CATH AND CORONARY ANGIOGRAPHY N/A 01/23/2022   Procedure: RIGHT/LEFT HEART CATH AND CORONARY ANGIOGRAPHY;  Surgeon: CSherren Mocha MD;  [facility];  Service: Cardiovascular;  Laterality: N/A;   TOTAL KNEE ARTHROPLASTY Right    TOTAL KNEE ARTHROPLASTY Left 06/18/2015   Procedure: TOTAL KNEE ARTHROPLASTY;  Surgeon: FFrederik Pear MD;  [place]  Service: Orthopedics;  Laterality: Left;   TOTAL KNEE REVISION Right 02/11/2021   Procedure: RIGHT TOTAL KNEE REVISION;  Surgeon: RFrederik Pear MD;  Location: [facility];  Service: Orthopedics;  Laterality: Right;   TRANSCATHETER AORTIC VALVE REPLACEMENT, TRANSFEMORAL N/A 02/25/2022   Procedure: Transcatheter Aortic Valve Replacement, Transfemoral;  Surgeon: Early Osmond, MD;  Location: [place] [facility];  Service: Open Heart Surgery;  Laterality: N/A;    Current Medications: Current Meds  Medication Sig   acetaminophen (TYLENOL) 650 MG CR tablet Take 650 mg by mouth every 8 (eight) hours as needed for pain.   alfuzosin (UROXATRAL) 10 MG 24 hr tablet Take 1 tablet (10 mg total) by mouth at bedtime.   amoxicillin (AMOXIL) 500 MG capsule Take 2,000 mg by  mouth See admin instructions. Take 2000 mg 1 hour prior to dental work   apixaban (ELIQUIS) 5 MG TABS tablet TAKE 1 TABLET BY MOUTH TWICE A DAY   atorvastatin (LIPITOR) 10 MG tablet TAKE MON-WEN- AND FRIDAYS (Patient taking differently: Take 10 mg by mouth every Monday, Wednesday, and Friday.)   cholecalciferol (VITAMIN D3) 25 MCG (1000 UNIT) tablet Take 1,000 Units by mouth daily.   doxycycline (VIBRA-TABS) 100 MG tablet Take 100 mg by mouth daily. Continuous   finasteride (PROSCAR) 5 MG tablet Take 1 tablet (5 mg total) by mouth daily at 12 noon.   Omega-3 Fatty Acids (FISH OIL) 1000 MG CAPS Take 1,000 mg by mouth daily.   valACYclovir (VALTREX) 500 MG tablet Take 500 mg by mouth 2 (two) times daily as needed (Cold sore). for 5 days at onset of cold sore   vitamin B-12 (CYANOCOBALAMIN) 1000 MCG tablet Take 1,000 mcg by mouth daily.     Allergies:    Patient has no known allergies.   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  Tobacco Use   Smoking status: Never   Smokeless tobacco: Never  Vaping Use   Vaping Use: Never used  Substance and Sexual Activity   Alcohol use: No   Drug use: No   Sexual activity: Yes  Other Topics Concern   Not on file  Social History Narrative   Not on file   Social Determinants of Health   Financial Resource Strain: Not on file  Food Insecurity: Not on file  Transportation Needs: Not on file  Physical Activity: Not on file  Stress: Not on file  Social Connections: Not on file     Family History: The patient's family history includes Breast cancer in his sister and sister; Heart attack in his mother; Heart disease in his father.  ROS:   All other ROS reviewed and negative. Pertinent positives noted in the HPI.      EKGs/Labs/Other Studies Reviewed:   The following studies were personally reviewed by me today:  EKG:  EKG is ordered today.  The ekg ordered today demonstrates sinus bradycardia heart rate 50, left axis deviation, no acute ischemic changes, and was personally reviewed by me.   LHC 01/23/2022 1.  Patent coronary arteries with no significant change in coronary anatomy from previous study with no significant stenosis in the left main, LAD, left circumflex, or nondominant RCA.  There is severe stenosis in a small to medium caliber second diagonal branch that is unchanged from the previous study. 2.  Severe calcific aortic stenosis with mean transvalvular gradient of 51 mmHg, calculated aortic valve area of 0.44  cm 3.  Normal right heart pressures with preserved cardiac output.  TTE 03/26/2022  1. Left ventricular ejection fraction, by estimation, is 55%. The left  ventricle has normal function. The left ventricle has no regional wall  motion abnormalities. There is mild left ventricular hypertrophy. Left  ventricular diastolic parameters are  consistent with Grade II diastolic dysfunction (pseudonormalization).   2. Right ventricular systolic function is normal. The right ventricular  size is normal. There is normal pulmonary artery systolic pressure. The  estimated right ventricular systolic pressure is 10.6 mmHg.   3. Left atrial size was mildly dilated.   4. The mitral valve is normal in structure. Mild to moderate mitral valve  regurgitation. No evidence of mitral stenosis.   5. Bioprosthetic aortic valve s/p TAVR. There is a 26 mm Sapien THV.  Mild-moderate perivalvular regurgitation. Mean gradint 12 mmHg with DI  0.52, EOA 1.63 cm^2.   6. The inferior vena cava is normal in size with greater than 50%  respiratory variability, suggesting right atrial pressure of 3 mmHg.   Recent Labs: 02/21/2022: ALT 19 02/26/2022: BUN 20; Creatinine, Ser 1.03; Hemoglobin 11.6; Magnesium 2.0; Platelets  89; Potassium 4.1; Sodium 139   Recent Lipid Panel No results found for: "CHOL", "TRIG", "HDL", "CHOLHDL", "VLDL", "LDLCALC", "LDLDIRECT"  Physical Exam:   VS:  BP 115/64   Pulse (!) 50   Ht 6' (1.829 m)   Wt 188 lb 9.6 oz (85.5 kg)   SpO2 99%   BMI 25.58 kg/m    Wt Readings from Last 3 Encounters:  05/08/22 188 lb 9.6 oz (85.5 kg)  04/28/22 185 lb 3 oz (84 kg)  03/31/22 183 lb (83 kg)    General: Well nourished, well developed, in no acute distress Head: Atraumatic, normal size  Eyes: PEERLA, EOMI  Neck: Supple, no JVD Endocrine: No thryomegaly Cardiac: Normal S1, S2; RRR; no murmurs, rubs, or gallops Lungs: Clear to auscultation bilaterally, no wheezing, rhonchi or rales  Abd: Soft, nontender, no hepatomegaly  Ext: No edema, pulses 2+ Musculoskeletal: No deformities, BUE and BLE strength normal and equal Skin: Warm and dry, no rashes   Neuro: Alert and oriented to person, place, time, and situation, CNII-XII grossly intact, no focal deficits  Psych: Normal mood and affect   ASSESSMENT:   Gerald Spears is a 81 y.o. male who presents for the following: 1. Dizziness   2. SOB (shortness of breath) on exertion     PLAN:   1. Dizziness 2. SOB (shortness of breath) on exertion -Appears to be secondary to metoprolol and what I suspect is bradycardia.  His EKG demonstrates sinus bradycardia heart rate 50 with no A-fib.  He is feeling much better off metoprolol.  I would like for him to complete his heart monitor to make sure he is not having high-grade conduction disease.  We also need to exclude any A-fib episodes.  He has an Apple watch.  He should monitor his rhythm as well.  Echo last month shows normal LV function and normal TAVR.  No significant bleeding on Eliquis.  He understands he needs antibiotics before dental work.  Overall is doing well after his TAVR.  I suspect his acute exacerbation is bradycardia.  If he does develop atrial fibrillation he likely has an element  of sick sinus syndrome.  I suspect he will ultimately need a pacemaker in life if his A-fib becomes more of a recurrent issue.  We will see how he does.  Disposition: Return in  about 3 months (around 08/07/2022).  Medication Adjustments/Labs and Tests Ordered: Current medicines are reviewed at length with the patient today.  Concerns regarding medicines are outlined above.  Orders Placed This Encounter  Procedures   EKG 12-Lead   No orders of the defined types were placed in this encounter.   Patient Instructions  Medication Instructions:  Continue to hold Metoprolol   *If you need a refill on your cardiac medications before your next appointment, please call your pharmacy*    Follow-Up: At [facility], you and your health needs are our priority.  As part of our continuing mission to provide you with exceptional heart care, we have created designated Provider Care Teams.  These Care Teams include your primary Cardiologist (physician) and Advanced Practice Providers (APPs -  Physician Assistants and Nurse Practitioners) who all work together to provide you with the care you need, when you need it.  We recommend signing up for the patient portal called "MyChart".  Sign up information is provided on this After Visit Summary.  MyChart is used to connect with patients for Virtual Visits (Telemedicine).  Patients are able to view lab/test results, encounter notes, upcoming appointments, etc.  Non-urgent messages can be sent to your provider as well.   To learn more about what you can do with MyChart, go to [email or web address].    Your next appointment:   Keep appointment in December  The format for your next appointment:   In Person  Provider:   Evalina Field, MD            Time Spent with Patient: I have spent a total of 25 minutes with patient reviewing hospital notes, telemetry, EKGs, labs and examining the patient as well as establishing an assessment and plan  that was discussed with the patient.  > 50% of time was spent in direct patient care.  Signed, Addison Naegeli. Audie Box, MD, [place]  [address], [place], [place] [address] [phone]  05/08/2022 9:15 AM

## 2022-05-08 NOTE — Patient Instructions (Signed)
Medication Instructions:  Continue to hold Metoprolol   *If you need a refill on your cardiac medications before your next appointment, please call your pharmacy*    Follow-Up: At [facility], you and your health needs are our priority.  As part of our continuing mission to provide you with exceptional heart care, we have created designated Provider Care Teams.  These Care Teams include your primary Cardiologist (physician) and Advanced Practice Providers (APPs -  Physician Assistants and Nurse Practitioners) who all work together to provide you with the care you need, when you need it.  We recommend signing up for the patient portal called "MyChart".  Sign up information is provided on this After Visit Summary.  MyChart is used to connect with patients for Virtual Visits (Telemedicine).  Patients are able to view lab/test results, encounter notes, upcoming appointments, etc.  Non-urgent messages can be sent to your provider as well.   To learn more about what you can do with MyChart, go to [email or web address].    Your next appointment:   Keep appointment in December  The format for your next appointment:   In Person  Provider:   Evalina Field, MD

## 2022-05-09 ENCOUNTER — Encounter (HOSPITAL_COMMUNITY)
Admission: RE | Admit: 2022-05-09 | Discharge: 2022-05-09 | Disposition: A | Discharge status: Home / Self Care | Payer: Medicare Other | Source: Ambulatory Visit | Attending: Cardiovascular Disease | Admitting: Cardiovascular Disease

## 2022-05-09 DIAGNOSIS — Z952 Presence of prosthetic heart valve: Secondary | ICD-10-CM

## 2022-05-09 NOTE — Progress Notes (Signed)
Daily Session Note  Patient Details  Name: ADONIAS DEMORE MRN: [id number] Date of Birth: 17-Jun-1941 Referring Provider:   Flowsheet Row CARDIAC REHAB PHASE II ORIENTATION from 04/28/2022 in [place]  Referring Provider Dr. Ali Lowe       Encounter Date: 05/09/2022  Check In:  Session Check In - 05/09/22 0808       Check-In   Supervising physician immediately available to respond to emergencies [facility] MD immediately available    Physician(s) Dr Harrington Challenger    Location [facility]    Staff Present Aidenjames Heckmann Hassell Done, RN, Bjorn Loser, MS, ACSM-CEP, Exercise Physiologist    Virtual Visit No    Medication changes reported     No    Fall or balance concerns reported    Yes    Comments Patient reports losing his balance at times. He uses a cane sometimes at home.    Tobacco Cessation No Change    Warm-up and Cool-down Performed as group-led instruction    Resistance Training Performed Yes    VAD Patient? No    PAD/SET Patient? No      Pain Assessment   Currently in Pain? No/denies    Pain Score 0-No pain    Multiple Pain Sites No             Capillary Blood Glucose: No results found for this or any previous visit (from the past 24 hour(s)).    Social History   Tobacco Use  Smoking Status Never  Smokeless Tobacco Never    Goals Met:  Independence with exercise equipment Exercise tolerated well No report of concerns or symptoms today Strength training completed today  Goals Unmet:  Not Applicable  Comments: Checkout at 0915.   Dr. Carlyle Dolly is Medical Director for [facility] Card[facility]

## 2022-05-09 NOTE — Progress Notes (Signed)
I have reviewed a Home Exercise Prescription with [name] . Gerald Spears is currently exercising at home.  The patient was advised to walk on his treadmill, ride his stationary bike, and/or use hand weights 2-4 days a week for 30-45 minutes.  Penn and I discussed how to progress their exercise prescription.  The patient stated that their goals were to return to his daily activities such as playing golf.  The patient stated that they understand the exercise prescription.  We reviewed exercise guidelines, target heart rate during exercise, RPE Scale, weather conditions, NTG use, endpoints for exercise, warmup and cool down.  Patient is encouraged to come to me with any questions. I will continue to follow up with the patient to assist them with progression and safety.

## 2022-05-12 ENCOUNTER — Encounter (HOSPITAL_COMMUNITY)
Admission: RE | Admit: 2022-05-12 | Discharge: 2022-05-12 | Disposition: A | Discharge status: Home / Self Care | Payer: Medicare Other | Source: Ambulatory Visit | Attending: Cardiovascular Disease | Admitting: Cardiovascular Disease

## 2022-05-12 DIAGNOSIS — Z952 Presence of prosthetic heart valve: Secondary | ICD-10-CM

## 2022-05-12 NOTE — Progress Notes (Signed)
Daily Session Note  Patient Details  Name: Gerald Spears MRN: [id number] Date of Birth: [birth year] Referring Provider:   Flowsheet Row CARDIAC REHAB PHASE II ORIENTATION from 04/28/2022 in [place]  Referring Provider Dr. Ali Lowe       Encounter Date: 05/12/2022  Check In:  Session Check In - 05/12/22 0820       Check-In   Supervising physician immediately available to respond to emergencies [facility] MD immediately available    Physician(s) Dr Domenic Polite    Staff Present Audrena Talaga Hassell Done, RN, BSN;Heather Otho Ket, BS, Exercise Physiologist    Virtual Visit No    Medication changes reported     No    Fall or balance concerns reported    Yes    Comments Patient reports losing his balance at times. He uses a cane sometimes at home.    Tobacco Cessation No Change    Warm-up and Cool-down Performed as group-led instruction    Resistance Training Performed Yes    VAD Patient? No    PAD/SET Patient? No      Pain Assessment   Currently in Pain? No/denies    Pain Score 0-No pain    Multiple Pain Sites No             Capillary Blood Glucose: No results found for this or any previous visit (from the past 24 hour(s)).    Social History   Tobacco Use  Smoking Status Never  Smokeless Tobacco Never    Goals Met:  Independence with exercise equipment Exercise tolerated well No report of concerns or symptoms today Strength training completed today  Goals Unmet:  Not Applicable  Comments: Checkout at 0915.   Dr. Carlyle Dolly is Medical Director for [facility] Card[facility]

## 2022-05-14 ENCOUNTER — Encounter (HOSPITAL_COMMUNITY): Payer: Medicare Other

## 2022-05-15 ENCOUNTER — Other Ambulatory Visit (HOSPITAL_BASED_OUTPATIENT_CLINIC_OR_DEPARTMENT_OTHER): Payer: Self-pay | Admitting: Cardiovascular Disease

## 2022-05-15 NOTE — Telephone Encounter (Signed)
Prescription refill request for Eliquis received. Indication:Afib Last office visit:9/23 Scr:1.0 Age: 81 Weight:85.5 kg  Prescription refilled

## 2022-05-16 ENCOUNTER — Encounter (HOSPITAL_COMMUNITY): Payer: Medicare Other

## 2022-05-19 ENCOUNTER — Encounter (HOSPITAL_COMMUNITY): Payer: Medicare Other

## 2022-05-20 NOTE — Progress Notes (Signed)
Discharge Progress Report  Patient Details  Name: Gerald Spears MRN: [id number] Date of Birth: 1941-06-04 Referring Provider:   Flowsheet Row CARDIAC REHAB PHASE II ORIENTATION from 04/28/2022 in [facility]  Referring Provider Dr. Ali Lowe        Number of Visits: 6  Reason for Discharge:  Early Exit:  Personal  Smoking History:  Social History   Tobacco Use  Smoking Status Never  Smokeless Tobacco Never    Diagnosis:  Status post transcatheter aortic valve replacement  ADL UCSD:   Initial Exercise Prescription:  Initial Exercise Prescription - 04/28/22 1300       Date of Initial Exercise RX and Referring Provider   Date 04/28/22    Referring Provider Dr. Ali Lowe    Expected Discharge Date 07/18/22      Treadmill   MPH 2.4    Grade 0    Minutes 17      NuStep   Level 1    SPM 60    Minutes 22      Prescription Details   Frequency (times per week) 3    Duration Progress to 30 minutes of continuous aerobic without signs/symptoms of physical distress      Intensity   THRR 40-80% of Max Heartrate 55-110    Ratings of Perceived Exertion 11-13      Resistance Training   Training Prescription Yes    Weight 3    Reps 10-15             Discharge Exercise Prescription (Final Exercise Prescription Changes):  Exercise Prescription Changes - 05/12/22 1023       Response to Exercise   Blood Pressure (Admit) 92/60    Blood Pressure (Exercise) 100/50    Blood Pressure (Exit) 102/60    Heart Rate (Admit) 68 bpm    Heart Rate (Exercise) 117 bpm    Heart Rate (Exit) 77 bpm    Rating of Perceived Exertion (Exercise) 12    Duration Continue with 30 min of aerobic exercise without signs/symptoms of physical distress.    Intensity THRR unchanged      Progression   Progression Continue to progress workloads to maintain intensity without signs/symptoms of physical distress.      Resistance Training   Training Prescription Yes     Weight 4    Reps 10-15    Time 10 Minutes      Treadmill   MPH 2.4    Grade 0    Minutes 17    METs 2.84      NuStep   Level 2    SPM 89    Minutes 22    METs 1.86             Functional Capacity:  6 Minute Walk     Row Name 04/28/22 1349         6 Minute Walk   Phase Initial     Distance 1400 feet     Walk Time 6 minutes     # of Rest Breaks 0     MPH 2.65     METS 2.29     RPE 11     VO2 Peak 8.01     Symptoms No     Resting HR 57 bpm     Resting BP 92/58     Resting Oxygen Saturation  98 %     Exercise Oxygen Saturation  during 6 min walk 98 %     Max  Ex. HR 78 bpm     Max Ex. BP 110/58     2 Minute Post BP 96/58              Psychological, QOL, Others - Outcomes: PHQ 2/9:    04/28/2022    1:40 PM 03/12/2011   10:39 AM  Depression screen PHQ 2/9  Decreased Interest 0 1  Down, Depressed, Hopeless 0 1  PHQ - 2 Score 0 2  Altered sleeping 0   Tired, decreased energy 0   Change in appetite 0   Feeling bad or failure about yourself  0   Trouble concentrating 0   Moving slowly or fidgety/restless 0   Suicidal thoughts 0   PHQ-9 Score 0   Difficult doing work/chores Not difficult at all     Quality of Life:  Quality of Life - 04/28/22 1353       Quality of Life   Select Quality of Life      Quality of Life Scores   Health/Function Pre 22.93 %    Socioeconomic Pre 20.83 %    Psych/Spiritual Pre 24.29 %    Family Pre 26.4 %    GLOBAL Pre 23.36 %             Personal Goals: Goals established at orientation with interventions provided to work toward goal.  Personal Goals and Risk Factors at Admission - 04/28/22 1343       Core Components/Risk Factors/Patient Goals on Admission    Weight Management Weight Maintenance    Improve shortness of breath with ADL's Yes    Intervention Provide education, individualized exercise plan and daily activity instruction to help decrease symptoms of SOB with activities of daily living.     Expected Outcomes Short Term: Improve cardiorespiratory fitness to achieve a reduction of symptoms when performing ADLs;Long Term: Be able to perform more ADLs without symptoms or delay the onset of symptoms    Personal Goal Other Yes    Personal Goal Patient wants to improve his strength and stamina and be able to play 18 holes of golf.    Intervention Patient will attend CR 3 days/week with exercise and education.    Expected Outcomes Patient will complete the program meeting both personal and program.              Personal Goals Discharge:   Exercise Goals and Review:  Exercise Goals     Row Name 04/28/22 1352             Exercise Goals   Increase Physical Activity Yes       Intervention Provide advice, education, support and counseling about physical activity/exercise needs.;Develop an individualized exercise prescription for aerobic and resistive training based on initial evaluation findings, risk stratification, comorbidities and participant's personal goals.       Expected Outcomes Short Term: Attend rehab on a regular basis to increase amount of physical activity.;Long Term: Add in home exercise to make exercise part of routine and to increase amount of physical activity.;Long Term: Exercising regularly at least 3-5 days a week.       Increase Strength and Stamina Yes       Intervention Provide advice, education, support and counseling about physical activity/exercise needs.;Develop an individualized exercise prescription for aerobic and resistive training based on initial evaluation findings, risk stratification, comorbidities and participant's personal goals.       Expected Outcomes Short Term: Increase workloads from initial exercise prescription for resistance, speed, and METs.;Short Term:  Perform resistance training exercises routinely during rehab and add in resistance training at home;Long Term: Improve cardiorespiratory fitness, muscular endurance and strength as  measured by increased METs and functional capacity (6MWT)       Able to understand and use rate of perceived exertion (RPE) scale Yes       Intervention Provide education and explanation on how to use RPE scale       Expected Outcomes Short Term: Able to use RPE daily in rehab to express subjective intensity level;Long Term:  Able to use RPE to guide intensity level when exercising independently       Knowledge and understanding of Target Heart Rate Range (THRR) Yes       Intervention Provide education and explanation of THRR including how the numbers were predicted and where they are located for reference       Expected Outcomes Short Term: Able to state/look up THRR;Short Term: Able to use daily as guideline for intensity in rehab;Long Term: Able to use THRR to govern intensity when exercising independently       Able to check pulse independently Yes       Intervention Provide education and demonstration on how to check pulse in carotid and radial arteries.;Review the importance of being able to check your own pulse for safety during independent exercise       Expected Outcomes Short Term: Able to explain why pulse checking is important during independent exercise;Long Term: Able to check pulse independently and accurately       Understanding of Exercise Prescription Yes       Intervention Provide education, explanation, and written materials on patient's individual exercise prescription       Expected Outcomes Short Term: Able to explain program exercise prescription;Long Term: Able to explain home exercise prescription to exercise independently                Exercise Goals Re-Evaluation:   Nutrition & Weight - Outcomes:  Pre Biometrics - 04/28/22 1353       Pre Biometrics   Height 5' 11.5" (1.816 m)    Weight 185 lb 3 oz (84 kg)    Waist Circumference 37 inches    Hip Circumference 39.5 inches    Waist to Hip Ratio 0.94 %    BMI (Calculated) 25.47    Triceps Skinfold 12 mm     % Body Fat 24.6 %    Grip Strength 34.7 kg    Flexibility 0 in    Single Leg Stand 5 seconds              Nutrition:  Nutrition Therapy & Goals - 04/28/22 1341       Personal Nutrition Goals   Comments Patient scored 62 on his diet assessment. Handout provided and explained regarding heathier choices with he and his wife. We offer 2 educational sessions on heart healthy nutrition with handouts and assistance with RD referral if patient is interested.      Intervention Plan   Intervention Nutrition handout(s) given to patient.    Expected Outcomes Short Term Goal: Understand basic principles of dietary content, such as calories, fat, sodium, cholesterol and nutrients.             Nutrition Discharge:  Nutrition Assessments - 04/28/22 1341       MEDFICTS Scores   Pre Score 62             Education Questionnaire Score:  Knowledge Questionnaire Score - 04/28/22 1342  Knowledge Questionnaire Score   Pre Score 21/24            Pt discharged from CR after 6 sessions. His wife called on 9/29, and stated that he decided that he wants to do rehab on his home. Pt's wife seemed to be disappointed with his decision, and she said, "you can give his spot to someone who actually cares about their health".

## 2022-05-21 ENCOUNTER — Encounter (HOSPITAL_COMMUNITY): Payer: Medicare Other

## 2022-05-23 ENCOUNTER — Encounter (HOSPITAL_COMMUNITY): Payer: Medicare Other

## 2022-05-26 ENCOUNTER — Encounter (HOSPITAL_COMMUNITY): Payer: Medicare Other

## 2022-05-27 DIAGNOSIS — R42 Dizziness and giddiness: Secondary | ICD-10-CM | POA: Diagnosis not present

## 2022-05-28 ENCOUNTER — Telehealth: Payer: Self-pay | Admitting: Cardiovascular Disease

## 2022-05-28 ENCOUNTER — Encounter (HOSPITAL_COMMUNITY): Payer: Medicare Other

## 2022-05-28 NOTE — Telephone Encounter (Signed)
Gerald Spears from [facility] called to report the following abnormal monitor report:  2% burden afib  Highest rate 186 bmp for 60 sec 46 runs of SVT  Spoke to pt. He report he is still having issues with feeling dizzy. Last episode occurred while playing golf yesterday. He also report BP has been on the soft side 105/57.  Dr. Ellyn Hack (DOD) made aware and reviewed report. Per MD, recommended scheduling appointment with Afib clinic or APP.   Appointment scheduled for 10/13 with Jory Sims, DNP

## 2022-05-28 NOTE — Telephone Encounter (Signed)
[facility] by Theodore Demark calling to give abnormal results.

## 2022-05-29 NOTE — Progress Notes (Signed)
Cardiology Clinic Note   Patient Name: Gerald Spears Date of Encounter: 05/30/2022  Primary Care Provider:  [name], MD Primary Cardiologist:  Gerald Field, MD  Patient Profile     81 y.o. male with a hx of severe aortic stenosis status post TAVR, persistent atrial fibrillation, CAD, hyperlipidemia.  When seen last by Dr. Audie Spears on 05/08/2022 the patient reported a near syncopal episode.  Metoprolol was discontinued and symptoms improved he was maintaining normal sinus rhythm.  Past Medical History    Past Medical History:  Diagnosis Date   Cancer [facility])    colon cancer and melanoma   Complication of anesthesia    difficult to wake up after surgery   Coronary artery disease    DJD (degenerative joint disease)    Dr. Noemi Spears end stages of DJD 2011   Hard of hearing    History of hiatal hernia    PAF (paroxysmal atrial fibrillation) (HCC)    S/P TAVR (transcatheter aortic valve replacement) 02/25/2022   S3UR 36m via TF apprach with Dr. TAli Spears Dr. BCyndia Spears  Severe aortic stenosis    Urinary frequency    UTI (lower urinary tract infection)    Past Surgical History:  Procedure Laterality Date   ANKLE SURGERY Right    COLONOSCOPY W/ BIOPSIES AND POLYPECTOMY     ELBOW SURGERY Right    EYE SURGERY     HERNIA REPAIR     INTRAOPERATIVE TRANSTHORACIC ECHOCARDIOGRAM N/A 02/25/2022   Procedure: INTRAOPERATIVE TRANSTHORACIC ECHOCARDIOGRAM;  Surgeon: Gerald Osmond MD;  [facility];  Service: Open Heart Surgery;  Laterality: N/A;   JOINT REPLACEMENT     KNEE SURGERY Right    x4   LEFT HEART CATH AND CORONARY ANGIOGRAPHY N/A 09/12/2020   Procedure: LEFT HEART CATH AND CORONARY ANGIOGRAPHY;  Surgeon: CSherren Mocha MD;  [facility];  Service: Cardiovascular;  Laterality: N/A;   MULTIPLE TOOTH EXTRACTIONS     RIGHT/LEFT HEART CATH AND CORONARY ANGIOGRAPHY N/A 01/23/2022   Procedure: RIGHT/LEFT HEART CATH AND CORONARY ANGIOGRAPHY;   Surgeon: CSherren Mocha MD;  [facility];  Service: Cardiovascular;  Laterality: N/A;   TOTAL KNEE ARTHROPLASTY Right    TOTAL KNEE ARTHROPLASTY Left 06/18/2015   Procedure: TOTAL KNEE ARTHROPLASTY;  Surgeon: FFrederik Pear MD;  [facility]  Service: Orthopedics;  Laterality: Left;   TOTAL KNEE REVISION Right 02/11/2021   Procedure: RIGHT TOTAL KNEE REVISION;  Surgeon: RFrederik Pear MD;  Location: [facility];  Service: Orthopedics;  Laterality: Right;   TRANSCATHETER AORTIC VALVE REPLACEMENT, TRANSFEMORAL N/A 02/25/2022   Procedure: Transcatheter Aortic Valve Replacement, Transfemoral;  Surgeon: Gerald Osmond MD;  [facility];  Service: Open Heart Surgery;  Laterality: N/A;    Allergies  No Known Allergies  History of Present Illness    Mr. TBelluccireturns today for ongoing assessment and management of sinus bradycardia, placed on this medication due to post operative atrial fibrillation in the setting of TAVR.  On last office visit metoprolol was discontinued.  We were to monitor him for recurrent atrial fibrillation.    He has an AVisual merchandiserand was to keep up with his rhythm and cardiac alarms which may indicate atrial fibrillation.  He was to continue to remain on Eliquis, CHA2DS2-VASc score of 3.  If he had recurrent atrial fibrillation consideration for pacemaker may need to be discussed.  He was also reminded that he would need dental prophylaxis  with antibiotics in the setting of TAVR.  Gerald Spears comes today with his wife accompanying him.  He states that he is still not feeling well.  He is very hard of hearing and his wife helps with communication.  She states that he has been dizzy, lightheaded, having some mild shortness of breath.  He is no longer driving, and he complains that he is unable to play golf or be very active at all without feeling extremely tired.  He is normally very active playing golf, pickleball, walking etc. but he is unable to  do that due to hypotension and extreme fatigue.  They bring with them his Apple watch recordings on their phone, which did report episodes of paroxysmal atrial fibrillation on 3 different occasions.  Review of his cardiac monitor also documented a 2% atrial for burden with heart rates ranging from 93 to 219 bpm with an average heart rate of 147 bpm, the longest lasting 6 hours and 31 minutes.  Additionally he had 46 SVT runs with the longest lasting 16 beats with an average rate of 105 bpm.  It appears that he is symptomatic with this especially concerning hypotension.  His wife states that his blood pressure runs below 008 systolic when his heart rate is in the 40s.  He is continue to be medically compliant with Eliquis and as stated above is no longer taking metoprolol.   Home Medications    Current Outpatient Medications  Medication Sig Dispense Refill   alfuzosin (UROXATRAL) 10 MG 24 hr tablet Take 1 tablet (10 mg total) by mouth at bedtime. 30 tablet 11   atorvastatin (LIPITOR) 10 MG tablet TAKE MON-WEN- AND FRIDAYS (Patient taking differently: Take 10 mg by mouth every Monday, Wednesday, and Friday.) 90 tablet 3   cholecalciferol (VITAMIN D3) 25 MCG (1000 UNIT) tablet Take 1,000 Units by mouth daily.     doxycycline (VIBRA-TABS) 100 MG tablet Take 100 mg by mouth daily. Continuous     ELIQUIS 5 MG TABS tablet TAKE 1 TABLET BY MOUTH TWICE A DAY 180 tablet 1   finasteride (PROSCAR) 5 MG tablet Take 1 tablet (5 mg total) by mouth daily at 12 noon. 90 tablet 3   Omega-3 Fatty Acids (FISH OIL) 1000 MG CAPS Take 1,000 mg by mouth daily.     vitamin Gerald-12 (CYANOCOBALAMIN) 1000 MCG tablet Take 1,000 mcg by mouth daily.     acetaminophen (TYLENOL) 650 MG CR tablet Take 650 mg by mouth every 8 (eight) hours as needed for pain. (Patient not taking: Reported on 05/30/2022)     amoxicillin (AMOXIL) 500 MG capsule Take 2,000 mg by mouth See admin instructions. Take 2000 mg 1 hour prior to dental work  (Patient not taking: Reported on 05/30/2022)     valACYclovir (VALTREX) 500 MG tablet Take 500 mg by mouth 2 (two) times daily as needed (Cold sore). for 5 days at onset of cold sore (Patient not taking: Reported on 05/30/2022)  2   No current facility-administered medications for this visit.     Family History    Family History  Problem Relation Age of Onset   Heart disease Father    Heart attack Mother        age 23   Breast cancer Sister    Breast cancer Sister    He indicated that his mother is deceased. He indicated that his father is alive. He indicated that both of his sisters are alive.  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  Tobacco Use   Smoking status: Never   Smokeless tobacco: Never  Vaping Use   Vaping Use: Never used  Substance and Sexual Activity   Alcohol use: No   Drug use: No   Sexual activity: Yes  Other Topics Concern   Not on file  Social History Narrative   Not on file   Social Determinants of Health   Financial Resource Strain: Not on file  Food Insecurity: Not on file  Transportation Needs: Not on file  Physical Activity: Not on file  Stress: Not on file  Social Connections: Not on file  Intimate Partner Violence: Not on file     Review of Systems    General:  No chills, fever, night sweats or weight changes.  Extreme fatigue, dizziness, mild shortness of breath. Cardiovascular:  No chest pain, mild dyspnea on exertion, edema, orthopnea, palpitations, paroxysmal nocturnal dyspnea. Dermatological: No rash, lesions/masses Respiratory: No cough, dyspnea Urologic: No hematuria, dysuria Abdominal:   No nausea, vomiting, diarrhea, bright red blood per rectum, melena, or hematemesis Neurologic:  No visual changes,  wkns, changes in mental status.  Having short-term memory loss. All other systems reviewed and are otherwise negative except as noted above.     Physical Exam    VS:  BP 116/62 (BP Location: Left Arm, Patient Position: Sitting, Cuff Size: Normal)   Pulse (!) 53   Ht _0  (1.803 m)   Wt 186 lb (84.4 kg)   SpO2 96%   BMI 25.94 kg/m  , BMI Body mass index is 25.94 kg/m.     GEN: Well nourished, well developed, in no acute distress. HEENT: normal. Neck: Supple, no JVD, carotid bruits, or masses. Cardiac: RRR, bradycardic, no murmurs, rubs, or gallops. No clubbing, cyanosis, edema.  Radials/DP/PT 2+ and equal bilaterally.  Respiratory:  Respirations regular and unlabored, clear to auscultation bilaterally. GI: Soft, nontender, nondistended, BS + x 4. MS: no deformity or atrophy. Skin: warm and dry, no rash. Neuro:  Strength and sensation are intact. Psych: Normal affect.  Accessory Clinical Findings    ECG personally reviewed by me today-sinus bradycardia, with sinus arrhythmia heart rate of 53 bpm left axis deviation is noted- No acute changes  Lab Results  Component Value Date   WBC 5.7 02/26/2022   HGB 11.6 (L) 02/26/2022   HCT 34.0 (L) 02/26/2022   MCV 88.5 02/26/2022   PLT 89 (L) 02/26/2022   Lab Results  Component Value Date   CREATININE 1.03 02/26/2022   BUN 20 02/26/2022   NA 139 02/26/2022   K 4.1 02/26/2022   CL 106 02/26/2022   CO2 26 02/26/2022   Lab Results  Component Value Date   ALT 19 02/21/2022   AST 24 02/21/2022   ALKPHOS 58 02/21/2022   BILITOT 1.3 (H) 02/21/2022   No results found for: "CHOL", "HDL", "LDLCALC", "LDLDIRECT", "TRIG", "CHOLHDL"  No results found for: "HGBA1C"  Review of Prior Studies: Cardiac Monitor 05/06/2022 Patient had a min HR of 44 bpm, max HR of 219 bpm, and avg HR of 65 bpm. Predominant underlying rhythm was Sinus Rhythm. Slight P wave morphology changes were noted. 46 Supraventricular Tachycardia runs occurred, the  run with the fastest interval lasting beats with a  max rate of 207 bpm, the longest lasting 16 beats with an avg rate of 105 bpm. Atrial Fibrillation occurred (2% burden), ranging from 93-219 bpm (avg of 147 bpm), the longest lasting 6 hours 31 mins with an avg rate of 147 bpm.  Supraventricular Tachycardia was detected within +/- 45 seconds of symptomatic patient event(s). Isolated SVEs were frequent (10.4%, [id number]), SVE Couplets were rare (<1.0%, 3388), and SVE Triplets were rare (<1.0%, 466). Isolated VEs were rare (<1.0%,1484), VE Couplets were rare (<1.0%, 2), and VE Triplets were rare (<1.0%, 2). Inverted QRS complexes possibly due to inverted placement of device. MD notification criteria for Rapid Atrial Fibrillation met - report posted prior to notification per account request (PK).  LHC 01/23/2022 1.  Patent coronary arteries with no significant change in coronary anatomy from previous study with no significant stenosis in the left main, LAD, left circumflex, or nondominant RCA.  There is severe stenosis in a small to medium caliber second diagonal branch that is unchanged from the previous study. 2.  Severe calcific aortic stenosis with mean transvalvular gradient of 51 mmHg, calculated aortic valve area of 0.44 cm 3.  Normal right heart pressures with preserved cardiac output.   TTE 03/26/2022  1. Left ventricular ejection fraction, by estimation, is 55%. The left  ventricle has normal function. The left ventricle has no regional wall  motion abnormalities. There is mild left ventricular hypertrophy. Left  ventricular diastolic parameters are  consistent with Grade II diastolic dysfunction (pseudonormalization).   2. Right ventricular systolic function is normal. The right ventricular  size is normal. There is normal pulmonary artery systolic pressure. The  estimated right ventricular systolic pressure is 94.5 mmHg.   3. Left atrial size was mildly dilated.   4. The mitral valve is normal  in structure. Mild to moderate mitral valve  regurgitation. No evidence of mitral stenosis.   5. Bioprosthetic aortic valve s/p TAVR. There is a 26 mm Sapien THV.  Mild-moderate perivalvular regurgitation. Mean gradint 12 mmHg with DI  0.52, EOA 1.63 cm^2.   6. The inferior vena cava is normal in size with greater than 50%  respiratory variability, suggesting right atrial pressure of 3 mmHg.   Assessment & Plan   1.  Paroxysmal atrial fibrillation: This was noted on cardiac monitor (2% burden), and also on Apple Watch recordings on 3 separate occasions.  He had been taken off of metoprolol due to hypotension and bradycardia.    The patient continues to have complaints of fatigue, shortness of breath, and dizziness.  This is frustrating for him as he is usually very active playing pickle ball, golf, walking, and is unable to do so now.  He is ready to move forward with referral to EP to discuss pacemaker insertion as was noted by Dr. Audie Spears on previous note, 05/08/2022.    I have discussed the referral with them and although they continue to have some questions around it, they are willing to proceed with seeing Dr. Quentin Ore, and possible surgery planned.  The patient is being referred to Dr. Lars Mage on first available.  I have also sent a message to Dr. Audie Spears to make him aware of the patient's decision, as well as CC-ing chart.  The patient is to continue on Eliquis as directed.   2.  Aortic valve stenosis: Status post TAVR on 02/25/2022.  Edwards SAPIEN 3 you are via the TF approach . he is doing well and echocardiogram reviewed reveals that his TAVR is working well.  3. CAD: most recent left heart catheterization 09/12/2020, revealed severe diagonal stenosis of 60% with otherwise nonobstructive disease.  Continue secondary prevention with statin therapy.  He is not on beta-blocker due to bradycardia or antihypertensive due to hypotension.  4.  Hyperlipidemia: Goal of LDL less than 70.   Continue atorvastatin 10 mg Monday Wednesdays and Fridays.   Current medicines are reviewed at length with the patient today.  I have spent 45 min's  dedicated to the care of this patient on the date of this encounter to include pre-visit review of records, assessment, management and diagnostic testing,with shared decision making. Signed, Phill Myron. West Pugh, ANP, AACC   05/30/2022 4:10 PM      Office [phone] Fax [phone]  Notice: This dictation was prepared with Dragon dictation along with smaller phrase technology. Any transcriptional errors that result from this process are unintentional and may not be corrected upon review.

## 2022-05-30 ENCOUNTER — Ambulatory Visit: Payer: Medicare Other | Attending: Adult Health | Admitting: Adult Health

## 2022-05-30 ENCOUNTER — Encounter (HOSPITAL_COMMUNITY): Payer: Medicare Other

## 2022-05-30 ENCOUNTER — Encounter: Payer: Self-pay | Admitting: Adult Health

## 2022-05-30 VITALS — BP 116/62 | HR 53 | Ht 71.0 in | Wt 186.0 lb

## 2022-05-30 DIAGNOSIS — E7800 Pure hypercholesterolemia, unspecified: Secondary | ICD-10-CM | POA: Diagnosis not present

## 2022-05-30 DIAGNOSIS — I25119 Atherosclerotic heart disease of native coronary artery with unspecified angina pectoris: Secondary | ICD-10-CM

## 2022-05-30 DIAGNOSIS — I480 Paroxysmal atrial fibrillation: Secondary | ICD-10-CM | POA: Diagnosis not present

## 2022-05-30 DIAGNOSIS — R42 Dizziness and giddiness: Secondary | ICD-10-CM | POA: Diagnosis not present

## 2022-05-30 DIAGNOSIS — R001 Bradycardia, unspecified: Secondary | ICD-10-CM | POA: Diagnosis not present

## 2022-05-30 DIAGNOSIS — Z952 Presence of prosthetic heart valve: Secondary | ICD-10-CM

## 2022-05-30 NOTE — Patient Instructions (Signed)
Medication Instructions:  No Changes *If you need a refill on your cardiac medications before your next appointment, please call your pharmacy*   Lab Work: No Labs If you have labs (blood work) drawn today and your tests are completely normal, you will receive your results only by: [place] (if you have MyChart) OR A paper copy in the mail If you have any lab test that is abnormal or we need to change your treatment, we will call you to review the results.   Testing/Procedures: No Testing   Follow-Up: At [facility], you and your health needs are our priority.  As part of our continuing mission to provide you with exceptional heart care, we have created designated Provider Care Teams.  These Care Teams include your primary Cardiologist (physician) and Advanced Practice Providers (APPs -  Physician Assistants and Nurse Practitioners) who all work together to provide you with the care you need, when you need it.  We recommend signing up for the patient portal called "MyChart".  Sign up information is provided on this After Visit Summary.  MyChart is used to connect with patients for Virtual Visits (Telemedicine).  Patients are able to view lab/test results, encounter notes, upcoming appointments, etc.  Non-urgent messages can be sent to your provider as well.   To learn more about what you can do with MyChart, go to [email or web address].    Your next appointment:   Keep Scheduled Appointment  The format for your next appointment:   In Person  Provider:   Evalina Field, MD

## 2022-06-02 ENCOUNTER — Encounter (HOSPITAL_COMMUNITY): Payer: Medicare Other

## 2022-06-04 ENCOUNTER — Encounter (HOSPITAL_COMMUNITY): Payer: Medicare Other

## 2022-06-04 ENCOUNTER — Telehealth: Payer: Self-pay | Admitting: Cardiovascular Disease

## 2022-06-04 NOTE — Telephone Encounter (Signed)
Wife called stating that at the patient's last office visit it was discussed the patient would be set up to see Dr. Quentin Ore.  Wife is calling to follow-up.

## 2022-06-05 ENCOUNTER — Encounter: Payer: Self-pay | Admitting: Cardiovascular Disease

## 2022-06-05 DIAGNOSIS — I480 Paroxysmal atrial fibrillation: Secondary | ICD-10-CM

## 2022-06-06 ENCOUNTER — Encounter (HOSPITAL_COMMUNITY): Payer: Medicare Other

## 2022-06-09 ENCOUNTER — Encounter (HOSPITAL_COMMUNITY): Payer: Medicare Other

## 2022-06-11 ENCOUNTER — Encounter (HOSPITAL_COMMUNITY): Payer: Medicare Other

## 2022-06-13 ENCOUNTER — Encounter (HOSPITAL_COMMUNITY): Payer: Medicare Other

## 2022-06-16 ENCOUNTER — Encounter: Payer: Self-pay | Admitting: Cardiovascular Disease

## 2022-06-16 ENCOUNTER — Encounter (HOSPITAL_COMMUNITY): Payer: Medicare Other

## 2022-06-16 ENCOUNTER — Ambulatory Visit: Payer: Medicare Other | Attending: Cardiology | Admitting: Cardiovascular Disease

## 2022-06-16 VITALS — BP 112/58 | HR 68 | Ht 71.0 in | Wt 183.0 lb

## 2022-06-16 DIAGNOSIS — I480 Paroxysmal atrial fibrillation: Secondary | ICD-10-CM

## 2022-06-16 DIAGNOSIS — Z952 Presence of prosthetic heart valve: Secondary | ICD-10-CM | POA: Diagnosis not present

## 2022-06-16 NOTE — Progress Notes (Signed)
Cardiology Office Note:    Date:  06/16/2022   ID:  Gerald Spears, DOB September 23, 1940, MRN [id number]  PCP:  [name], [place] Providers Cardiologist:  Evalina Field, MD Electrophysiologist:  Melida Quitter, MD     Referring MD: Lendon Colonel, NP   Chief complaint: AM dizziness with standing  History of Present Illness:    Gerald Spears is a 81 y.o. male with a hx of aortic stenosis s/p TAVR, atrial fibrillation which has been persistent in the past, referred by dr. Audie Box for management of bradycardia and atrial arrhythmia.  The patient complained of dizziness while taking metoprolol in the past. In clinic, his ECG showed sinus bradycardia at 50 bpm. The dizziness improved when he discontinued metoprolol.  A monitor was placed that showed an average heart rate of 65 bpm. He did have several episodes of AT and AF with uncontrolled rates.  His Apple Watch detected several episodes of A-fib after he discontinued metoprolol.  These episodes occurred while he was wearing the monitor correlated with monitor-detected atrial fib episodes.  His Apple Watch has not detected any A-fib since September 22.  He reports today that he feels much better than he did when taking metoprolol.  He does have some dizziness in the morning that occurs primarily after standing up and resolves after short period time.     Past Medical History:  Diagnosis Date   Cancer [facility])    colon cancer and melanoma   Complication of anesthesia    difficult to wake up after surgery   Coronary artery disease    DJD (degenerative joint disease)    Dr. Noemi Chapel end stages of DJD 2011   Hard of hearing    History of hiatal hernia    PAF (paroxysmal atrial fibrillation) (HCC)    S/P TAVR (transcatheter aortic valve replacement) 02/25/2022   S3UR 43m via TF apprach with Dr. TAli Loweand Dr. BCyndia Bent  Severe aortic stenosis    Urinary frequency    UTI (lower urinary tract  infection)     Past Surgical History:  Procedure Laterality Date   ANKLE SURGERY Right    COLONOSCOPY W/ BIOPSIES AND POLYPECTOMY     ELBOW SURGERY Right    EYE SURGERY     HERNIA REPAIR     INTRAOPERATIVE TRANSTHORACIC ECHOCARDIOGRAM N/A 02/25/2022   Procedure: INTRAOPERATIVE TRANSTHORACIC ECHOCARDIOGRAM;  Surgeon: TEarly Osmond MD;  [facility];  Service: Open Heart Surgery;  Laterality: N/A;   JOINT REPLACEMENT     KNEE SURGERY Right    x4   LEFT HEART CATH AND CORONARY ANGIOGRAPHY N/A 09/12/2020   Procedure: LEFT HEART CATH AND CORONARY ANGIOGRAPHY;  Surgeon: CSherren Mocha MD;  [facility];  Service: Cardiovascular;  Laterality: N/A;   MULTIPLE TOOTH EXTRACTIONS     RIGHT/LEFT HEART CATH AND CORONARY ANGIOGRAPHY N/A 01/23/2022   Procedure: RIGHT/LEFT HEART CATH AND CORONARY ANGIOGRAPHY;  Surgeon: CSherren Mocha MD;  [facility];  Service: Cardiovascular;  Laterality: N/A;   TOTAL KNEE ARTHROPLASTY Right    TOTAL KNEE ARTHROPLASTY Left 06/18/2015   Procedure: TOTAL KNEE ARTHROPLASTY;  Surgeon: FFrederik Pear MD;  [facility]  Service: Orthopedics;  Laterality: Left;   TOTAL KNEE REVISION Right 02/11/2021   Procedure: RIGHT TOTAL KNEE REVISION;  Surgeon: RFrederik Pear MD;  Location: [facility];  Service: Orthopedics;  Laterality: Right;   TRANSCATHETER AORTIC VALVE REPLACEMENT, TRANSFEMORAL N/A  02/25/2022   Procedure: Transcatheter Aortic Valve Replacement, Transfemoral;  Surgeon: Early Osmond, MD;  Location: [place] [facility];  Service: Open Heart Surgery;  Laterality: N/A;    Current Medications: Current Meds  Medication Sig   alfuzosin (UROXATRAL) 10 MG 24 hr tablet Take 1 tablet (10 mg total) by mouth at bedtime.   atorvastatin (LIPITOR) 10 MG tablet TAKE MON-WEN- AND FRIDAYS (Patient taking differently: Take 10 mg by mouth every Monday, Wednesday, and Friday.)   cholecalciferol (VITAMIN D3) 25 MCG (1000 UNIT) tablet  Take 1,000 Units by mouth daily.   doxycycline (VIBRA-TABS) 100 MG tablet Take 100 mg by mouth daily. Continuous   ELIQUIS 5 MG TABS tablet TAKE 1 TABLET BY MOUTH TWICE A DAY   finasteride (PROSCAR) 5 MG tablet Take 1 tablet (5 mg total) by mouth daily at 12 noon.   vitamin B-12 (CYANOCOBALAMIN) 1000 MCG tablet Take 1,000 mcg by mouth daily.     Allergies:   Patient has no known allergies.   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  Tobacco Use   Smoking status: Never   Smokeless tobacco: Never  Vaping Use   Vaping Use: Never used  Substance and Sexual Activity   Alcohol use: No   Drug use: No   Sexual activity: Yes  Other Topics Concern   Not on file  Social History Narrative   Not on file   Social Determinants of Health   Financial Resource Strain: Not on file  Food Insecurity: Not on file  Transportation Needs: Not on file  Physical Activity: Not on file  Stress: Not on file  Social Connections: Not on file     Family History: The patient's family history includes Breast cancer in his sister and sister; Heart attack in his mother; Heart disease in his father.  ROS:   Please see the history of present illness.    All other systems reviewed and are negative.  EKGs/Labs/Other Studies Reviewed:     14 day monitor: Patient had a min HR of 44 bpm (sinus bradycardia), max HR of 219 bpm (atrial fibrillation), and avg HR of 65 bpm (normal sinus rhythm). Predominant underlying rhythm was Sinus Rhythm. 46 Supraventricular Tachycardia runs occurred, the run with the fastest interval lasting 7 beats with a max rate of 207 bpm, the longest lasting 16 beats with an avg rate of 105 bpm. Atrial Fibrillation occurred (2% burden), ranging from 93-219 bpm (avg of 147 bpm), the  longest lasting 6 hours 31 mins with an avg rate of 147 bpm. Supraventricular Tachycardia was detected within +/- 45 seconds of symptomatic patient event(s). Isolated SVEs were frequent (10.4%, [id number]), SVE Couplets were rare (<1.0%, 3388), and SVE Triplets were rare (<1.0%, 466). Isolated VEs were rare (<1.0%, 1484), VE Couplets were rare (<1.0%, 2), and VE Triplets were rare (<1.0%, 2). Triggered events correlated with sinus rhythm with PVCs   Impression: Paroxysmal Afib detected (2% burden, max HR 219). SVT detected (likely atrial tachycardia). Frequent PACs (10.4% burden).   TTE 03/26/22 EF 55%. Mild LVH. Left atrium is mildly dilated  EKG:  Last EKG results: Sinus rhythm with frequent PACs. HR 73 bpm   Recent Labs: 02/21/2022: ALT 19 02/26/2022: BUN 20; Creatinine, Ser 1.03; Hemoglobin 11.6;  Magnesium 2.0; Platelets 89; Potassium 4.1; Sodium 139    Risk Assessment/Calculations:    CHA2DS2-VASc Score = 4   This indicates a 4.8% annual risk of stroke. The patient's score is based upon: CHF History: 0 HTN History: 1 Diabetes History: 0 Stroke History: 0 Vascular Disease History: 1 Age Score: 2 Gender Score: 0         Physical Exam:    VS:  BP (!) 112/58   Pulse 68   Ht '5\' 11"'$  (1.803 m)   Wt 183 lb (83 kg)   SpO2 97%   BMI 25.52 kg/m     Wt Readings from Last 3 Encounters:  06/16/22 183 lb (83 kg)  05/30/22 186 lb (84.4 kg)  05/08/22 188 lb 9.6 oz (85.5 kg)     GEN:  Well nourished, well developed in no acute distress CARDIAC: RRR, no murmurs, rubs, gallops RESPIRATORY:  Normal work of breathing MUSCULOSKELETAL: no edema    ASSESSMENT & PLAN:    Sinus bradycardia: minimum heart rate (44 bpm) occurred at about 3 AM. Average HR is 65 bpm. In clinic today, 73 bpm. He seems to be doing well off of metoprolol. Will monitor.  Paroxysmal atrial fibrillation and atrial tachycardia: rates are not controlled in AF. Last episode was over a month ago. I think he has a good  prognosis with rhythm control and may avoid rate controlling medications, which I think would necessitate pacemaker placement.  We discussed rhythm control options including Tikosyn and ablation.  I described the logistics, risks and benefits of each option - both include a small risk of serious complications including death. He has had issues with anesthesia in the past, and his wife has appreciated some cognitive decline after undergoing anesthesia, so they have opted for Tikosyn load. I explained to him that he will need to avoid certain medications with Tikosyn. S/p TAVR: valve functioning normally          Medication Adjustments/Labs and Tests Ordered: Current medicines are reviewed at length with the patient today.  Concerns regarding medicines are outlined above.  Orders Placed This Encounter  Procedures   EKG 12-Lead   No orders of the defined types were placed in this encounter.    Signed, Melida Quitter, MD  06/16/2022 9:08 AM    [place]

## 2022-06-16 NOTE — Patient Instructions (Signed)
Medication Instructions:  Your physician has recommended you make the following change in your medication:  1) You will be started on Tikosyn - see information below *If you need a refill on your cardiac medications before your next appointment, please call your pharmacy*   Follow-Up: At [facility], you and your health needs are our priority.  As part of our continuing mission to provide you with exceptional heart care, we have created designated Provider Care Teams.  These Care Teams include your primary Cardiologist (physician) and Advanced Practice Providers (APPs -  Physician Assistants and Nurse Practitioners) who all work together to provide you with the care you need, when you need it.  Other Instructions Tikosyn (Dofetilide) Hospital Admission   Prior to day of admission:  Check with drug insurance company for cost of drug to ensure affordability --- Dofetilide 500 mcg twice a day.  GoodRx is an option if insurance copay is unaffordable.    No Benadryl is allowed 3 days prior to admission.   Please ensure no missed doses of your anticoagulation (blood thinner) for 3 weeks prior to admission. If a dose is missed please notify our office immediately.   A pharmacist will review all your medications for potential interactions with Tikosyn. If any medication changes are needed prior to admission we will be in touch with you.   If any new medications are started AFTER your admission date is set with Nurse, adult. Please notify our office immediately so your medication list can be updated and reviewed by our pharmacist again.  On day of admission:  Tikosyn initiation requires a 3 night/4 day hospital stay with constant telemetry monitoring. You will have an EKG after each dose of Tikosyn as well as daily lab draws.   If the drug does not convert you to normal rhythm a cardioversion after the 4th dose of Tikosyn.   Afib Clinic office visit on the morning of admission is needed for  preliminary labs/ekg.   Time of admission is dependent on bed availability in the hospital. In some instances, you will be sent home until bed is available. Rarely admission can be delayed to the following day if hospital census prevents available beds.   You may bring personal belongings/clothing with you to the hospital. Please leave your suitcase in the car until you arrive in admissions.   Questions please call our office at [place]

## 2022-06-18 ENCOUNTER — Encounter (HOSPITAL_COMMUNITY): Payer: Medicare Other

## 2022-06-20 ENCOUNTER — Encounter (HOSPITAL_COMMUNITY): Payer: Medicare Other

## 2022-06-20 ENCOUNTER — Telehealth (HOSPITAL_COMMUNITY): Payer: Self-pay | Admitting: *Deleted

## 2022-06-20 ENCOUNTER — Encounter (HOSPITAL_COMMUNITY): Payer: Self-pay

## 2022-06-20 NOTE — Telephone Encounter (Signed)
Per Roderic Palau NP - QT stable on Alfuzosin can continue.

## 2022-06-20 NOTE — Telephone Encounter (Signed)
-----   Message from Rockne Menghini, RPH-CPP sent at 06/19/2022  1:37 PM EDT ----- Regarding: RE: Evalyn Casco  The alfuzosin is also QT prolonging.  Everything else is fine  Erasmo Downer ----- Message ----- From: Juluis Mire, RN Sent: 06/16/2022   9:43 AM EDT To: [facility] Subject: Phyllis Ginger                                        Pt for Phyllis Ginger please review meds thanks Carlitos Bottino ----- Message ----- From: Bernestine Amass, RN Sent: 06/16/2022   9:16 AM EDT To: Juluis Mire, RN  Good Morning! Patient needs admission for Tikosyn load.  Thanks! Carly

## 2022-06-23 ENCOUNTER — Encounter (HOSPITAL_COMMUNITY): Payer: Medicare Other

## 2022-06-23 ENCOUNTER — Ambulatory Visit (HOSPITAL_COMMUNITY): Payer: Medicare Other | Admitting: Physician Assistant

## 2022-06-25 ENCOUNTER — Encounter (HOSPITAL_COMMUNITY): Payer: Medicare Other

## 2022-06-27 ENCOUNTER — Encounter (HOSPITAL_COMMUNITY): Payer: Medicare Other

## 2022-06-30 ENCOUNTER — Encounter (HOSPITAL_COMMUNITY): Payer: Medicare Other

## 2022-07-02 ENCOUNTER — Encounter (HOSPITAL_COMMUNITY): Payer: Medicare Other

## 2022-07-02 ENCOUNTER — Other Ambulatory Visit: Payer: Self-pay | Admitting: Family Medicine

## 2022-07-02 DIAGNOSIS — E538 Deficiency of other specified B group vitamins: Secondary | ICD-10-CM | POA: Diagnosis not present

## 2022-07-02 DIAGNOSIS — R413 Other amnesia: Secondary | ICD-10-CM

## 2022-07-02 DIAGNOSIS — M170 Bilateral primary osteoarthritis of knee: Secondary | ICD-10-CM | POA: Diagnosis not present

## 2022-07-02 DIAGNOSIS — I2510 Atherosclerotic heart disease of native coronary artery without angina pectoris: Secondary | ICD-10-CM | POA: Diagnosis not present

## 2022-07-02 DIAGNOSIS — Z23 Encounter for immunization: Secondary | ICD-10-CM | POA: Diagnosis not present

## 2022-07-02 DIAGNOSIS — E7800 Pure hypercholesterolemia, unspecified: Secondary | ICD-10-CM | POA: Diagnosis not present

## 2022-07-02 DIAGNOSIS — Z0000 Encounter for general adult medical examination without abnormal findings: Secondary | ICD-10-CM | POA: Diagnosis not present

## 2022-07-02 DIAGNOSIS — I480 Paroxysmal atrial fibrillation: Secondary | ICD-10-CM | POA: Diagnosis not present

## 2022-07-02 DIAGNOSIS — E559 Vitamin D deficiency, unspecified: Secondary | ICD-10-CM | POA: Diagnosis not present

## 2022-07-02 DIAGNOSIS — H903 Sensorineural hearing loss, bilateral: Secondary | ICD-10-CM | POA: Diagnosis not present

## 2022-07-02 DIAGNOSIS — N400 Enlarged prostate without lower urinary tract symptoms: Secondary | ICD-10-CM | POA: Diagnosis not present

## 2022-07-04 ENCOUNTER — Encounter (HOSPITAL_COMMUNITY): Payer: Medicare Other

## 2022-07-07 ENCOUNTER — Encounter (HOSPITAL_COMMUNITY): Payer: Medicare Other

## 2022-07-09 ENCOUNTER — Encounter (HOSPITAL_COMMUNITY): Payer: Medicare Other

## 2022-07-09 ENCOUNTER — Institutional Professional Consult (permissible substitution): Payer: Medicare Other | Admitting: Cardiology

## 2022-07-11 ENCOUNTER — Encounter (HOSPITAL_COMMUNITY): Payer: Medicare Other

## 2022-07-14 ENCOUNTER — Encounter (HOSPITAL_COMMUNITY): Payer: Medicare Other

## 2022-07-16 ENCOUNTER — Encounter (HOSPITAL_COMMUNITY): Payer: Medicare Other

## 2022-07-17 ENCOUNTER — Ambulatory Visit
Admission: RE | Admit: 2022-07-17 | Discharge: 2022-07-17 | Disposition: A | Discharge status: Home / Self Care | Payer: Medicare Other | Source: Ambulatory Visit | Attending: Family Medicine | Admitting: Family Medicine

## 2022-07-17 DIAGNOSIS — I6782 Cerebral ischemia: Secondary | ICD-10-CM | POA: Diagnosis not present

## 2022-07-17 DIAGNOSIS — R413 Other amnesia: Secondary | ICD-10-CM | POA: Diagnosis not present

## 2022-07-17 DIAGNOSIS — G319 Degenerative disease of nervous system, unspecified: Secondary | ICD-10-CM | POA: Diagnosis not present

## 2022-07-18 ENCOUNTER — Encounter (HOSPITAL_COMMUNITY): Payer: Medicare Other

## 2022-07-18 ENCOUNTER — Encounter (HOSPITAL_COMMUNITY): Payer: Self-pay

## 2022-07-22 ENCOUNTER — Ambulatory Visit (HOSPITAL_COMMUNITY)
Admission: RE | Admit: 2022-07-22 | Discharge: 2022-07-22 | Disposition: A | Discharge status: Home / Self Care | Payer: Medicare Other | Source: Ambulatory Visit | Attending: Physician Assistant | Admitting: Physician Assistant

## 2022-07-22 ENCOUNTER — Encounter (HOSPITAL_COMMUNITY): Payer: Self-pay | Admitting: Physician Assistant

## 2022-07-22 ENCOUNTER — Other Ambulatory Visit: Payer: Self-pay

## 2022-07-22 ENCOUNTER — Encounter (HOSPITAL_COMMUNITY): Payer: Self-pay | Admitting: Cardiovascular Disease

## 2022-07-22 ENCOUNTER — Inpatient Hospital Stay: Admit: 2022-07-22 | Payer: Medicare Other

## 2022-07-22 ENCOUNTER — Observation Stay: Admit: 2022-07-22 | Payer: Medicare Other | Admitting: Cardiovascular Disease

## 2022-07-22 ENCOUNTER — Inpatient Hospital Stay (HOSPITAL_COMMUNITY)
Admission: RE | Admit: 2022-07-22 | Discharge: 2022-07-25 | DRG: 309 | Disposition: A | Discharge status: Home / Self Care | Payer: Medicare Other | Source: Ambulatory Visit | Attending: Cardiovascular Disease | Admitting: Cardiovascular Disease

## 2022-07-22 VITALS — BP 100/78 | HR 71 | Ht 71.0 in | Wt 182.4 lb

## 2022-07-22 DIAGNOSIS — Z96653 Presence of artificial knee joint, bilateral: Secondary | ICD-10-CM | POA: Diagnosis present

## 2022-07-22 DIAGNOSIS — Z85820 Personal history of malignant melanoma of skin: Secondary | ICD-10-CM | POA: Diagnosis not present

## 2022-07-22 DIAGNOSIS — I480 Paroxysmal atrial fibrillation: Secondary | ICD-10-CM | POA: Diagnosis present

## 2022-07-22 DIAGNOSIS — Z8249 Family history of ischemic heart disease and other diseases of the circulatory system: Secondary | ICD-10-CM | POA: Diagnosis not present

## 2022-07-22 DIAGNOSIS — Z7901 Long term (current) use of anticoagulants: Secondary | ICD-10-CM | POA: Diagnosis not present

## 2022-07-22 DIAGNOSIS — Z85038 Personal history of other malignant neoplasm of large intestine: Secondary | ICD-10-CM

## 2022-07-22 DIAGNOSIS — I2510 Atherosclerotic heart disease of native coronary artery without angina pectoris: Secondary | ICD-10-CM | POA: Diagnosis present

## 2022-07-22 DIAGNOSIS — D6869 Other thrombophilia: Secondary | ICD-10-CM | POA: Diagnosis present

## 2022-07-22 DIAGNOSIS — Z803 Family history of malignant neoplasm of breast: Secondary | ICD-10-CM | POA: Diagnosis not present

## 2022-07-22 DIAGNOSIS — I10 Essential (primary) hypertension: Secondary | ICD-10-CM | POA: Diagnosis present

## 2022-07-22 DIAGNOSIS — I471 Supraventricular tachycardia, unspecified: Secondary | ICD-10-CM | POA: Diagnosis present

## 2022-07-22 DIAGNOSIS — Z952 Presence of prosthetic heart valve: Secondary | ICD-10-CM | POA: Diagnosis not present

## 2022-07-22 DIAGNOSIS — E785 Hyperlipidemia, unspecified: Secondary | ICD-10-CM | POA: Diagnosis present

## 2022-07-22 LAB — BASIC METABOLIC PANEL
Anion gap: 5 (ref 5–15)
BUN: 20 mg/dL (ref 8–23)
CO2: 25 mmol/L (ref 22–32)
Calcium: 9.5 mg/dL (ref 8.9–10.3)
Chloride: 108 mmol/L (ref 98–111)
Creatinine, Ser: 1.1 mg/dL (ref 0.61–1.24)
GFR, Estimated: >60 mL/min (ref >60)
Glucose, Bld: 79 mg/dL (ref 70–99)
Potassium: 4.1 mmol/L (ref 3.5–5.1)
Sodium: 138 mmol/L (ref 135–145)

## 2022-07-22 LAB — MAGNESIUM: Magnesium: 2.1 mg/dL (ref 1.7–2.4)

## 2022-07-22 MED ORDER — SODIUM CHLORIDE 0.9 % IV SOLN: 250.0000 mL | INTRAVENOUS | Status: DC | PRN

## 2022-07-22 MED ORDER — DOXYCYCLINE HYCLATE 100 MG PO TABS
100.0000 mg | ORAL_TABLET | Freq: Every day | ORAL | Status: DC
  Administered 2022-07-23 – 2022-07-25 (×3): 100 mg via ORAL
  Filled 2022-07-22 (×3): qty 1

## 2022-07-22 MED ORDER — APIXABAN 5 MG PO TABS
5.0000 mg | ORAL_TABLET | Freq: Two times a day (BID) | ORAL | Status: DC
  Administered 2022-07-22 – 2022-07-25 (×6): 5 mg via ORAL
  Filled 2022-07-22 (×6): qty 1

## 2022-07-22 MED ORDER — ATORVASTATIN CALCIUM 10 MG PO TABS
10.0000 mg | ORAL_TABLET | ORAL | Status: DC
  Administered 2022-07-23 – 2022-07-25 (×2): 10 mg via ORAL
  Filled 2022-07-22 (×3): qty 1

## 2022-07-22 MED ORDER — SODIUM CHLORIDE 0.9% FLUSH: 3.0000 mL | INTRAVENOUS | Status: DC | PRN

## 2022-07-22 MED ORDER — DOFETILIDE 500 MCG PO CAPS
500.0000 ug | ORAL_CAPSULE | Freq: Two times a day (BID) | ORAL | Status: DC
  Administered 2022-07-22 – 2022-07-25 (×6): 500 ug via ORAL
  Filled 2022-07-22 (×6): qty 1

## 2022-07-22 MED ORDER — ACETAMINOPHEN 325 MG PO TABS
650.0000 mg | ORAL_TABLET | Freq: Four times a day (QID) | ORAL | Status: DC | PRN
  Administered 2022-07-23 – 2022-07-24 (×2): 650 mg via ORAL
  Filled 2022-07-22 (×2): qty 2

## 2022-07-22 MED ORDER — ALFUZOSIN HCL ER 10 MG PO TB24
10.0000 mg | ORAL_TABLET | Freq: Every day | ORAL | Status: DC
  Administered 2022-07-22 – 2022-07-24 (×3): 10 mg via ORAL
  Filled 2022-07-22 (×4): qty 1

## 2022-07-22 MED ORDER — FINASTERIDE 5 MG PO TABS: 5.0000 mg | ORAL_TABLET | Freq: Every day | ORAL | Status: DC

## 2022-07-22 MED ORDER — SODIUM CHLORIDE 0.9% FLUSH
3.0000 mL | Freq: Two times a day (BID) | INTRAVENOUS | Status: DC
  Administered 2022-07-22 – 2022-07-25 (×6): 3 mL via INTRAVENOUS

## 2022-07-22 MED ORDER — ACETAMINOPHEN ER 650 MG PO TBCR: 650.0000 mg | EXTENDED_RELEASE_TABLET | Freq: Three times a day (TID) | ORAL | Status: DC | PRN

## 2022-07-22 MED ORDER — FINASTERIDE 5 MG PO TABS
5.0000 mg | ORAL_TABLET | Freq: Every day | ORAL | Status: DC
  Administered 2022-07-22 – 2022-07-24 (×3): 5 mg via ORAL
  Filled 2022-07-22 (×3): qty 1

## 2022-07-22 NOTE — Progress Notes (Signed)
Pharmacy: Dofetilide (Tikosyn) - Initial Consult Assessment and Electrolyte Replacement  Pharmacy consulted to assist in monitoring and replacing electrolytes in this 81 y.o. male admitted on 07/22/2022 undergoing dofetilide initiation.   Assessment:  Patient Exclusion Criteria: If any screening criteria checked as "Yes", then  patient  should NOT receive dofetilide until criteria item is corrected.  If "Yes" please indicate correction plan.  YES  NO Patient  Exclusion Criteria Correction Plan   '[]'$   '[]'$   Baseline QTc interval is greater than or equal to 440 msec. IF above YES box checked dofetilide contraindicated unless patient has ICD; then may proceed if QTc 500-550 msec or with known ventricular conduction abnormalities may proceed with QTc 550-600 msec. QTc = 423 per clinic note    '[]'$   '[x]'$   Patient is known or suspected to have a digoxin level greater than 2 ng/ml: No results found for: "DIGOXIN"     '[]'$   '[x]'$   Creatinine clearance less than 20 ml/min (calculated using Cockcroft-Gault, actual body weight and serum creatinine): Estimated Creatinine Clearance: 55.1 mL/min (by C-G formula based on SCr of 1.1 mg/dL).     '[x]'$   '[]'$  Patient has received drugs known to prolong the QT intervals within the last 48 hours (phenothiazines, tricyclics or tetracyclic antidepressants, erythromycin, H-1 antihistamines, cisapride, fluoroquinolones, azithromycin, ondansetron).   Updated information on QT prolonging agents is available to be searched on the following database:QT prolonging agents  alfuzosin: possible QT risk. There is no contraindication to Tikosyn. Close monitoring of the QTc is recommended   '[]'$   '[x]'$   Patient received a dose of hydrochlorothiazide (Oretic) alone or in any combination including triamterene (Dyazide, Maxzide) in the last 48 hours.    '[]'$   '[x]'$  Patient received a medication known to increase dofetilide plasma concentrations prior to initial dofetilide dose:   Trimethoprim (Primsol, Proloprim) in the last 36 hours Verapamil (Calan, Verelan) in the last 36 hours or a sustained release dose in the last 72 hours Megestrol (Megace) in the last 5 days  Cimetidine (Tagamet) in the last 6 hours Ketoconazole (Nizoral) in the last 24 hours Itraconazole (Sporanox) in the last 48 hours  Prochlorperazine (Compazine) in the last 36 hours     '[]'$   '[x]'$   Patient is known to have a history of torsades de pointes; congenital or acquired long QT syndromes.    '[]'$   '[x]'$   Patient has received a Class 1 antiarrhythmic with less than 2 half-lives since last dose. (Disopyramide, Quinidine, Procainamide, Lidocaine, Mexiletine, Flecainide, Propafenone)    '[]'$   '[x]'$   Patient has received amiodarone therapy in the past 3 months or amiodarone level is greater than 0.3 ng/ml.    Labs:    Component Value Date/Time   K 4.1 07/22/2022 1053   MG 2.1 07/22/2022 1053     Plan: Select One Calculated CrCl  Dose q12h  '[x]'$  > 60 ml/min 500 mcg  '[]'$  40-60 ml/min 250 mcg  '[]'$  20-40 ml/min 125 mcg   '[x]'$   Physician selected initial dose within range recommended for patients level of renal function - will monitor for response.  '[]'$   Physician selected initial dose outside of range recommended for patients level of renal function - will discuss if the dose should be altered at this time.   Patient has been appropriately anticoagulated with apixaban.  Potassium: K >/= 4: Appropriate to initiate Tikosyn, no replacement needed    Magnesium: Mg >2: Appropriate to initiate Tikosyn, no replacement needed     Thank you for allowing  pharmacy to participate in this patient's care   Hildred Laser, PharmD Clinical Pharmacist **Pharmacist phone directory can now be found on [email or web address] ([email or web address]).  Listed under [place].

## 2022-07-22 NOTE — Progress Notes (Signed)
EKG reviewed with Dr. Myles Gip and [place] for Tikosyn Calc CrCl with actual body weight of 82.7kg and creat of 1.10 is 61  Plan to start 521mg dose  RTommye Standard PA-C

## 2022-07-22 NOTE — H&P (Signed)
Primary Care Physician: Evans Lance, MD Primary Cardiologist: Dr Audie Box Primary Electrophysiologist: Dr Myles Gip Referring Physician: Dr Shelda Jakes is a 81 y.o. male with a history of severe aortic stenosis status post TAVR, persistent atrial fibrillation, CAD, hyperlipidemia who presents for follow up in the [facility]. Patient is on Eliquis for a CHADS2VASC score of 3. He was previously on metoprolol but had symptomatic bradycardia and this was discontinued. However, his afib burden increased. He was seen by Dr Myles Gip who recommended dofetilide vs ablation, patient opted for dofetilide.   Today, patient presents for dofetilide admission. He denies any missed doses of anticoagulation in the past 3 weeks.   Today, he denies symptoms of palpitations, chest pain, shortness of breath, orthopnea, PND, lower extremity edema, dizziness, presyncope, syncope, snoring, daytime somnolence, bleeding, or neurologic sequela. The patient is tolerating medications without difficulties and is otherwise without complaint today.    Atrial Fibrillation Risk Factors:  he does not have symptoms or diagnosis of sleep apnea. he does not have a history of rheumatic fever.   he has a BMI of Body mass index is 25.31 kg/m.Marland Kitchen Filed Weights   07/22/22 1704  Weight: 82.3 kg    Family History  Problem Relation Age of Onset   Heart disease Father    Heart attack Mother        age 69   Breast cancer Sister    Breast cancer Sister      Atrial Fibrillation Management history:  Previous antiarrhythmic drugs: none Previous cardioversions: none Previous ablations: none CHADS2VASC score: 3 Anticoagulation history: Eliquis   Past Medical History:  Diagnosis Date   Cancer ([place])    colon cancer and melanoma   Complication of anesthesia    difficult to wake up after surgery   Coronary artery disease    DJD (degenerative joint disease)    Dr. Noemi Chapel end  stages of DJD 2011   Hard of hearing    History of hiatal hernia    PAF (paroxysmal atrial fibrillation) (HCC)    S/P TAVR (transcatheter aortic valve replacement) 02/25/2022   S3UR 15m via TF apprach with Dr. TAli Loweand Dr. BCyndia Bent  Severe aortic stenosis    Urinary frequency    UTI (lower urinary tract infection)    Past Surgical History:  Procedure Laterality Date   ANKLE SURGERY Right    COLONOSCOPY W/ BIOPSIES AND POLYPECTOMY     ELBOW SURGERY Right    EYE SURGERY     HERNIA REPAIR     INTRAOPERATIVE TRANSTHORACIC ECHOCARDIOGRAM N/A 02/25/2022   Procedure: INTRAOPERATIVE TRANSTHORACIC ECHOCARDIOGRAM;  Surgeon: TEarly Osmond MD;  [facility];  Service: Open Heart Surgery;  Laterality: N/A;   JOINT REPLACEMENT     KNEE SURGERY Right    x4   LEFT HEART CATH AND CORONARY ANGIOGRAPHY N/A 09/12/2020   Procedure: LEFT HEART CATH AND CORONARY ANGIOGRAPHY;  Surgeon: CSherren Mocha MD;  [facility];  Service: Cardiovascular;  Laterality: N/A;   MULTIPLE TOOTH EXTRACTIONS     RIGHT/LEFT HEART CATH AND CORONARY ANGIOGRAPHY N/A 01/23/2022   Procedure: RIGHT/LEFT HEART CATH AND CORONARY ANGIOGRAPHY;  Surgeon: CSherren Mocha MD;  [facility];  Service: Cardiovascular;  Laterality: N/A;   TOTAL KNEE ARTHROPLASTY Right    TOTAL KNEE ARTHROPLASTY Left 06/18/2015   Procedure: TOTAL KNEE ARTHROPLASTY;  Surgeon: FFrederik Pear MD;  [facility]  Service: Orthopedics;  Laterality: Left;   TOTAL KNEE REVISION Right 02/11/2021   Procedure: RIGHT TOTAL KNEE REVISION;  Surgeon: Frederik Pear, MD;  [facility];  Service: Orthopedics;  Laterality: Right;   TRANSCATHETER AORTIC VALVE REPLACEMENT, TRANSFEMORAL N/A 02/25/2022   Procedure: Transcatheter Aortic Valve Replacement, Transfemoral;  Surgeon: Early Osmond, MD;  Location: [facility];  Service: Open Heart Surgery;  Laterality: N/A;    Current Facility-Administered Medications   Medication Dose Route Frequency Provider Last Rate Last Admin   0.9 %  sodium chloride infusion  250 mL Intravenous PRN Fenton, Clint R, PA       acetaminophen (TYLENOL) CR tablet 650 mg  650 mg Oral Q8H PRN Fenton, Clint R, PA       alfuzosin (UROXATRAL) 24 hr tablet 10 mg  10 mg Oral QHS Fenton, Clint R, PA       apixaban (ELIQUIS) tablet 5 mg  5 mg Oral BID Fenton, Clint R, PA       [START ON 07/23/2022] atorvastatin (LIPITOR) tablet 10 mg  10 mg Oral Once per day on Mon Wed Fri Fenton, Clint R, PA       dofetilide (TIKOSYN) capsule 500 mcg  500 mcg Oral BID Ursuy, Renee Lynn, PA-C       [START ON 07/23/2022] doxycycline (VIBRA-TABS) tablet 100 mg  100 mg Oral Daily Fenton, Clint R, PA       [START ON 07/23/2022] finasteride (PROSCAR) tablet 5 mg  5 mg Oral Q1200 Fenton, Clint R, PA       sodium chloride flush (NS) 0.9 % injection 3 mL  3 mL Intravenous Q12H Fenton, Clint R, PA       sodium chloride flush (NS) 0.9 % injection 3 mL  3 mL Intravenous PRN Fenton, Clint R, PA        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  Tobacco Use   Smoking status: Never   Smokeless tobacco: Never   Tobacco comments:    Never smoke 07/22/22  Vaping Use   Vaping Use: Never used  Substance and Sexual Activity   Alcohol use: No   Drug use: No   Sexual activity: Yes  Other Topics Concern   Not on file  Social History Narrative   Not on file   Social Determinants of Health   Financial Resource Strain: Not on file  Food Insecurity: Not on file  Transportation Needs: Not on file  Physical Activity: Not on file  Stress: Not on file  Social Connections: Not on file  Intimate Partner Violence: Not on file     ROS- All systems are reviewed and negative except as per  the HPI above.  Physical Exam: Vitals:   07/22/22 1704  BP: 119/70  Pulse: 70  Resp: 18  Temp: 97.6 F (36.4 C)  TempSrc: Oral  SpO2: 100%  Weight: 82.3 kg  Height: '5\' 11"'$  (1.803 m)    GEN- The patient is a well appearing elderly male, alert and oriented x 3 today.   Head- normocephalic, atraumatic Eyes-  Sclera clear, conjunctiva pink Ears- hearing intact Oropharynx- clear Neck- supple  Lungs- Clear to ausculation bilaterally, normal work of breathing Heart- Regular rate and rhythm, occasional ectopic beat,  no murmurs, rubs or gallops  GI- soft, NT, ND, + BS Extremities- no clubbing, cyanosis, or edema MS- no significant deformity or atrophy Skin- no rash or lesion Psych- euthymic mood, full affect Neuro- strength and sensation are intact  Wt Readings from Last 3 Encounters:  07/22/22 82.3 kg  07/22/22 82.7 kg  06/16/22 83 kg    EKG today demonstrates  SR, PACs, short PR Vent. rate 71 BPM PR interval 104 ms QRS duration 90 ms QT/QTcB 390/423 ms  Echo 03/26/22 demonstrated   1. Left ventricular ejection fraction, by estimation, is 55%. The left  ventricle has normal function. The left ventricle has no regional wall  motion abnormalities. There is mild left ventricular hypertrophy. Left  ventricular diastolic parameters are consistent with Grade II diastolic dysfunction (pseudonormalization).   2. Right ventricular systolic function is normal. The right ventricular  size is normal. There is normal pulmonary artery systolic pressure. The  estimated right ventricular systolic pressure is 44.9 mmHg.   3. Left atrial size was mildly dilated.   4. The mitral valve is normal in structure. Mild to moderate mitral valve  regurgitation. No evidence of mitral stenosis.   5. Bioprosthetic aortic valve s/p TAVR. There is a 26 mm Sapien THV.  Mild-moderate perivalvular regurgitation. Mean gradint 12 mmHg with DI  0.52, EOA 1.63 cm^2.   6. The inferior vena cava is normal in  size with greater than 50%  respiratory variability, suggesting right atrial pressure of 3 mmHg.   Epic records are reviewed at length today  CHA2DS2-VASc Score = 3  The patient's score is based upon: CHF History: 0 HTN History: 0 Diabetes History: 0 Stroke History: 0 Vascular Disease History: 1 Age Score: 2 Gender Score: 0       ASSESSMENT AND PLAN: 1. Paroxysmal Atrial Fibrillation/atrial tach The patient's CHA2DS2-VASc score is 3, indicating a 3.2% annual risk of stroke.   Patient presents for dofetilide admission. Continue Eliquis 5 mg BID, states no missed doses in the last 3 weeks. No recent benadryl use PharmD has screened medications, alfuzosin continued.  QTc in SR 423 ms Labs today show creatinine at 1.1, K+ 4.1 and mag 2.1, CrCl calculated at 60 mL/min Off AV nodal agents with h/o symptomatic bradycardia.   2. Secondary Hypercoagulable State (ICD10:  D68.69) The patient is at significant risk for stroke/thromboembolism based upon his CHA2DS2-VASc Score of 3.  Continue Apixaban (Eliquis).   3. Valvular heart disease Severe AS s/p TAVR Mild to moderate MR  4. CAD No anginal symptoms.   To be admitted later today once a bed becomes available.    [facility] [address] [place], [place] [address] [phone] 07/22/2022 5:31 PM  Patient examined chart reviewed. Discussed care with nurse patient and wife. Seen in afib clinic this am. 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

## 2022-07-22 NOTE — Progress Notes (Signed)
Primary Care Physician: Evans Lance, MD Primary Cardiologist: Dr Audie Box Primary Electrophysiologist: Dr Myles Gip Referring Physician: Dr Shelda Jakes is a 81 y.o. male with a history of severe aortic stenosis status post TAVR, persistent atrial fibrillation, CAD, hyperlipidemia who presents for follow up in the [facility]. Patient is on Eliquis for a CHADS2VASC score of 3. He was previously on metoprolol but had symptomatic bradycardia and this was discontinued. However, his afib burden increased. He was seen by Dr Myles Gip who recommended dofetilide vs ablation, patient opted for dofetilide.   Today, patient presents for dofetilide admission. He denies any missed doses of anticoagulation in the past 3 weeks.   Today, he denies symptoms of palpitations, chest pain, shortness of breath, orthopnea, PND, lower extremity edema, dizziness, presyncope, syncope, snoring, daytime somnolence, bleeding, or neurologic sequela. The patient is tolerating medications without difficulties and is otherwise without complaint today.    Atrial Fibrillation Risk Factors:  he does not have symptoms or diagnosis of sleep apnea. he does not have a history of rheumatic fever.   he has a BMI of Body mass index is 25.44 kg/m.Marland Kitchen Filed Weights   07/22/22 1000  Weight: 82.7 kg    Family History  Problem Relation Age of Onset   Heart disease Father    Heart attack Mother        age 9   Breast cancer Sister    Breast cancer Sister      Atrial Fibrillation Management history:  Previous antiarrhythmic drugs: none Previous cardioversions: none Previous ablations: none CHADS2VASC score: 3 Anticoagulation history: Eliquis   Past Medical History:  Diagnosis Date   Cancer ([place])    colon cancer and melanoma   Complication of anesthesia    difficult to wake up after surgery   Coronary artery disease    DJD (degenerative joint disease)    Dr. Noemi Chapel end  stages of DJD 2011   Hard of hearing    History of hiatal hernia    PAF (paroxysmal atrial fibrillation) (HCC)    S/P TAVR (transcatheter aortic valve replacement) 02/25/2022   S3UR 78m via TF apprach with Dr. TAli Loweand Dr. BCyndia Bent  Severe aortic stenosis    Urinary frequency    UTI (lower urinary tract infection)    Past Surgical History:  Procedure Laterality Date   ANKLE SURGERY Right    COLONOSCOPY W/ BIOPSIES AND POLYPECTOMY     ELBOW SURGERY Right    EYE SURGERY     HERNIA REPAIR     INTRAOPERATIVE TRANSTHORACIC ECHOCARDIOGRAM N/A 02/25/2022   Procedure: INTRAOPERATIVE TRANSTHORACIC ECHOCARDIOGRAM;  Surgeon: TEarly Osmond MD;  [facility];  Service: Open Heart Surgery;  Laterality: N/A;   JOINT REPLACEMENT     KNEE SURGERY Right    x4   LEFT HEART CATH AND CORONARY ANGIOGRAPHY N/A 09/12/2020   Procedure: LEFT HEART CATH AND CORONARY ANGIOGRAPHY;  Surgeon: CSherren Mocha MD;  [facility];  Service: Cardiovascular;  Laterality: N/A;   MULTIPLE TOOTH EXTRACTIONS     RIGHT/LEFT HEART CATH AND CORONARY ANGIOGRAPHY N/A 01/23/2022   Procedure: RIGHT/LEFT HEART CATH AND CORONARY ANGIOGRAPHY;  Surgeon: CSherren Mocha MD;  [facility];  Service: Cardiovascular;  Laterality: N/A;   TOTAL KNEE ARTHROPLASTY Right    TOTAL KNEE ARTHROPLASTY Left 06/18/2015   Procedure: TOTAL KNEE ARTHROPLASTY;  Surgeon: FFrederik Pear MD;  [facility]  Service: Orthopedics;  Laterality: Left;   TOTAL KNEE REVISION Right 02/11/2021   Procedure: RIGHT TOTAL KNEE REVISION;  Surgeon: Frederik Pear, MD;  [facility];  Service: Orthopedics;  Laterality: Right;   TRANSCATHETER AORTIC VALVE REPLACEMENT, TRANSFEMORAL N/A 02/25/2022   Procedure: Transcatheter Aortic Valve Replacement, Transfemoral;  Surgeon: Early Osmond, MD;  Location: [facility];  Service: Open Heart Surgery;  Laterality: N/A;    Current Outpatient Medications   Medication Sig Dispense Refill   acetaminophen (TYLENOL) 650 MG CR tablet Take 650 mg by mouth every 8 (eight) hours as needed for pain.     alfuzosin (UROXATRAL) 10 MG 24 hr tablet Take 1 tablet (10 mg total) by mouth at bedtime. 30 tablet 11   amoxicillin (AMOXIL) 500 MG capsule Take 2,000 mg by mouth See admin instructions. Take 2000 mg 1 hour prior to dental work     atorvastatin (LIPITOR) 10 MG tablet TAKE MON-WEN- AND FRIDAYS 90 tablet 3   cholecalciferol (VITAMIN D3) 25 MCG (1000 UNIT) tablet Take 2,000 Units by mouth daily.     doxycycline (VIBRA-TABS) 100 MG tablet Take 100 mg by mouth daily. Continuous     ELIQUIS 5 MG TABS tablet TAKE 1 TABLET BY MOUTH TWICE A DAY 180 tablet 1   finasteride (PROSCAR) 5 MG tablet Take 1 tablet (5 mg total) by mouth daily at 12 noon. 90 tablet 3   Omega-3 Fatty Acids (FISH OIL) 1000 MG CAPS Take 1,000 mg by mouth daily.     valACYclovir (VALTREX) 500 MG tablet Take 500 mg by mouth 2 (two) times daily as needed (Cold sore). for 5 days at onset of cold sore  2   vitamin B-12 (CYANOCOBALAMIN) 1000 MCG tablet Take 1,000 mcg by mouth daily.     No current facility-administered medications for this encounter.    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  Tobacco Use   Smoking status: Never   Smokeless tobacco: Never   Tobacco comments:    Never smoke 07/22/22  Vaping Use   Vaping Use: Never used  Substance and Sexual Activity   Alcohol use: No   Drug use: No   Sexual activity: Yes  Other Topics Concern   Not on file  Social History Narrative   Not on file   Social Determinants of Health   Financial Resource Strain: Not on file  Food Insecurity: Not on file  Transportation Needs: Not on file  Physical Activity:  Not on file  Stress: Not on file  Social Connections: Not on file  Intimate Partner Violence: Not on file     ROS- All systems are reviewed and negative except as per the HPI above.  Physical Exam: Vitals:   07/22/22 1000  BP: 100/78  Pulse: 71  Weight: 82.7 kg  Height: '5\' 11"'$  (1.803 m)    GEN- The patient is a well appearing elderly male, alert and oriented x 3 today.   Head- normocephalic, atraumatic Eyes-  Sclera clear, conjunctiva pink Ears- hearing intact Oropharynx- clear Neck- supple  Lungs- Clear to ausculation bilaterally, normal work of breathing Heart- Regular rate and rhythm, occasional ectopic beat, no murmurs, rubs or gallops  GI- soft, NT, ND, + BS Extremities- no clubbing, cyanosis,  or edema MS- no significant deformity or atrophy Skin- no rash or lesion Psych- euthymic mood, full affect Neuro- strength and sensation are intact  Wt Readings from Last 3 Encounters:  07/22/22 82.7 kg  06/16/22 83 kg  05/30/22 84.4 kg    EKG today demonstrates  SR, PACs, short PR Vent. rate 71 BPM PR interval 104 ms QRS duration 90 ms QT/QTcB 390/423 ms  Echo 03/26/22 demonstrated   1. Left ventricular ejection fraction, by estimation, is 55%. The left  ventricle has normal function. The left ventricle has no regional wall  motion abnormalities. There is mild left ventricular hypertrophy. Left  ventricular diastolic parameters are consistent with Grade II diastolic dysfunction (pseudonormalization).   2. Right ventricular systolic function is normal. The right ventricular  size is normal. There is normal pulmonary artery systolic pressure. The  estimated right ventricular systolic pressure is 27.0 mmHg.   3. Left atrial size was mildly dilated.   4. The mitral valve is normal in structure. Mild to moderate mitral valve  regurgitation. No evidence of mitral stenosis.   5. Bioprosthetic aortic valve s/p TAVR. There is a 26 mm Sapien THV.  Mild-moderate perivalvular  regurgitation. Mean gradint 12 mmHg with DI  0.52, EOA 1.63 cm^2.   6. The inferior vena cava is normal in size with greater than 50%  respiratory variability, suggesting right atrial pressure of 3 mmHg.   Epic records are reviewed at length today  CHA2DS2-VASc Score = 3  The patient's score is based upon: CHF History: 0 HTN History: 0 Diabetes History: 0 Stroke History: 0 Vascular Disease History: 1 Age Score: 2 Gender Score: 0       ASSESSMENT AND PLAN: 1. Paroxysmal Atrial Fibrillation/atrial tach The patient's CHA2DS2-VASc score is 3, indicating a 3.2% annual risk of stroke.   Patient presents for dofetilide admission. Continue Eliquis 5 mg BID, states no missed doses in the last 3 weeks. No recent benadryl use PharmD has screened medications, alfuzosin continued.  QTc in SR 423 ms Labs today show creatinine at 1.1, K+ 4.1 and mag 2.1, CrCl calculated at 60 mL/min Off AV nodal agents with h/o symptomatic bradycardia.   2. Secondary Hypercoagulable State (ICD10:  D68.69) The patient is at significant risk for stroke/thromboembolism based upon his CHA2DS2-VASc Score of 3.  Continue Apixaban (Eliquis).   3. Valvular heart disease Severe AS s/p TAVR Mild to moderate MR  4. CAD No anginal symptoms.   To be admitted later today once a bed becomes available.    [facility] [address] [place], [place] [address] [phone] 07/22/2022 10:10 AM

## 2022-07-22 NOTE — Plan of Care (Signed)

## 2022-07-23 ENCOUNTER — Other Ambulatory Visit (HOSPITAL_COMMUNITY): Payer: Self-pay

## 2022-07-23 LAB — MAGNESIUM: Magnesium: 2.1 mg/dL (ref 1.7–2.4)

## 2022-07-23 LAB — BASIC METABOLIC PANEL
Anion gap: 11 (ref 5–15)
BUN: 22 mg/dL (ref 8–23)
CO2: 24 mmol/L (ref 22–32)
Calcium: 9.6 mg/dL (ref 8.9–10.3)
Chloride: 104 mmol/L (ref 98–111)
Creatinine, Ser: 1.05 mg/dL (ref 0.61–1.24)
GFR, Estimated: >60 mL/min (ref >60)
Glucose, Bld: 102 mg/dL — ABNORMAL HIGH (ref 70–99)
Potassium: 4.3 mmol/L (ref 3.5–5.1)
Sodium: 139 mmol/L (ref 135–145)

## 2022-07-23 NOTE — Plan of Care (Signed)

## 2022-07-23 NOTE — TOC Benefit Eligibility Note (Signed)
Patient Teacher, English as a foreign language completed.    The patient is currently admitted and upon discharge could be taking dofetilide (Tikosyn) 500 mcg capsules.  The current 30 day co-pay is $73.63.   The patient is insured through [place], [place] Patient Advocate Specialist [place] Patient Advocate Team Direct Number: [phone]  Fax: [phone]

## 2022-07-23 NOTE — Care Management (Signed)
  Transition of Care [facility]) Screening Note   Patient Details  Name: RYKKER COVIELLO Date of Birth: 1941-05-16   Transition of Care [facility]) CM/SW Contact:    Bethena Roys, RN Phone Number: 07/23/2022, 11:40 AM    Transition of Care Department [facility]) has reviewed the patient and no TOC needs have been identified at this time. Patient presented for Tikosyn Load. Benefits check submitted and cost is $73.63. Case Manager will discuss cost and pharmacy of choice as the patient progresses.

## 2022-07-23 NOTE — Progress Notes (Signed)
Mobility Specialist - Progress Note   07/23/22 1128  Mobility  Activity Ambulated with assistance in hallway  Level of Assistance Standby assist, set-up cues, supervision of patient - no hands on  Assistive Device None  Distance Ambulated (ft) 470 ft  Activity Response Tolerated well  Mobility Referral Yes  $Mobility charge 1 Mobility   Pt was received in bed and agreeable to session. Pt c/o slight dizziness upon standing but subsided after a few second. No complaints during ambulation. Pt was returned to bed with all needs met.  Gerald Spears  Mobility Specialist Please contact via Solicitor or Rehab office at [phone]

## 2022-07-23 NOTE — Progress Notes (Signed)
Pharmacy: Dofetilide (Tikosyn) - Follow Up Assessment and Electrolyte Replacement  Pharmacy consulted to assist in monitoring and replacing electrolytes in this 81 y.o. male admitted on 07/22/2022 undergoing dofetilide initiation. First dofetilide dose: 07/22/2022'@1953'$ .  Labs:    Component Value Date/Time   K 4.3 07/23/2022 0207   MG 2.1 07/23/2022 0207     Plan: Potassium: K >/= 4: No additional supplementation needed  Magnesium: Mg > 2: No additional supplementation needed   Thank you for allowing pharmacy to participate in this patient's care   Antonietta Jewel, PharmD, [place] Pharmacist  Phone: [phone] 07/23/2022 7:27 AM  Please check AMION for all [place] phone numbers After 10:00 PM, call [place] [phone]

## 2022-07-23 NOTE — Progress Notes (Addendum)
Rounding Note    Patient Name: Gerald Spears Date of Encounter: 07/23/2022  [facility] Cardiologist: Evalina Field, MD   Subjective   Feels well, reviewed Tikosyn admission process  Inpatient Medications    Scheduled Meds:  alfuzosin  10 mg Oral QHS   apixaban  5 mg Oral BID   atorvastatin  10 mg Oral Once per day on Mon Wed Fri   dofetilide  500 mcg Oral BID   doxycycline  100 mg Oral Daily   finasteride  5 mg Oral Q2200   sodium chloride flush  3 mL Intravenous Q12H   Continuous Infusions:  sodium chloride     PRN Meds: sodium chloride, acetaminophen, sodium chloride flush   Vital Signs    Vitals:   07/22/22 1704 07/22/22 2050 07/23/22 0401  BP: 119/70 123/89 133/68  Pulse: 70 (!) 57 (!) 52  Resp: 18    Temp: 97.6 F (36.4 C) 98.5 F (36.9 C) (!) 97.5 F (36.4 C)  TempSrc: Oral Oral Oral  SpO2: 100% 99% 100%  Weight: 82.3 kg    Height: '5\' 11"'$  (1.803 m)     No intake or output data in the 24 hours ending 07/23/22 0731    07/22/2022    5:04 PM 07/22/2022   10:00 AM 06/16/2022    8:38 AM  Last 3 Weights  Weight (lbs) 181 lb 8 oz 182 lb 6.4 oz 183 lb  Weight (kg) 82.328 kg 82.736 kg 83.008 kg      Telemetry    SB/SR 50's-80's, occ-frequent PACs - Personally Reviewed  ECG    SB 49bpm, manually measured QT 473m QTc 4339m- Personally Reviewed  Physical Exam   GEN: No acute distress.   Neck: No JVD Cardiac: RRR, no murmurs, rubs, or gallops.  Respiratory: CTA b/l. GI: Soft, nontender, non-distended  MS: No edema; No deformity. Neuro:  Nonfocal  Psych: Normal affect   Labs    High Sensitivity Troponin:  No results for input(s): "TROPONINIHS" in the last 720 hours.   Chemistry Recent Labs  Lab 07/22/22 1053 07/23/22 0207  NA 138 139  K 4.1 4.3  CL 108 104  CO2 25 24  GLUCOSE 79 102*  BUN 20 22  CREATININE 1.10 1.05  CALCIUM 9.5 9.6  MG 2.1 2.1  GFRNONAA >60 >60  ANIONGAP 5 11    Lipids No results for input(s):  "CHOL", "TRIG", "HDL", "LABVLDL", "LDLCALC", "CHOLHDL" in the last 168 hours.  HematologyNo results for input(s): "WBC", "RBC", "HGB", "HCT", "MCV", "MCH", "MCHC", "RDW", "PLT" in the last 168 hours. Thyroid No results for input(s): "TSH", "FREET4" in the last 168 hours.  BNPNo results for input(s): "BNP", "PROBNP" in the last 168 hours.  DDimer No results for input(s): "DDIMER" in the last 168 hours.   Radiology    No results found.  Cardiac Studies   14 day monitor: Patient had a min HR of 44 bpm (sinus bradycardia), max HR of 219 bpm (atrial fibrillation), and avg HR of 65 bpm (normal sinus rhythm). Predominant underlying rhythm was Sinus Rhythm. 46 Supraventricular Tachycardia runs occurred, the run with the fastest interval lasting 7 beats with a max rate of 207 bpm, the longest lasting 16 beats with an avg rate of 105 bpm. Atrial Fibrillation occurred (2% burden), ranging from 93-219 bpm (avg of 147 bpm), the longest lasting 6 hours 31 mins with an avg rate of 147 bpm. Supraventricular Tachycardia was detected within +/- 45 seconds of symptomatic  patient event(s). Isolated SVEs were frequent (10.4%, [id number]), SVE Couplets were rare (<1.0%, 3388), and SVE Triplets were rare (<1.0%, 466). Isolated VEs were rare (<1.0%, 1484), VE Couplets were rare (<1.0%, 2), and VE Triplets were rare (<1.0%, 2). Triggered events correlated with sinus rhythm with PVCs   Impression: Paroxysmal Afib detected (2% burden, max HR 219). SVT detected (likely atrial tachycardia). Frequent PACs (10.4% burden).    TTE 03/26/22 1. Left ventricular ejection fraction, by estimation, is 55%. The left  ventricle has normal function. The left ventricle has no regional wall  motion abnormalities. There is mild left ventricular hypertrophy. Left  ventricular diastolic parameters are  consistent with Grade II diastolic dysfunction (pseudonormalization).   2. Right ventricular systolic function is normal. The right  ventricular  size is normal. There is normal pulmonary artery systolic pressure. The  estimated right ventricular systolic pressure is 27.2 mmHg.   3. Left atrial size was mildly dilated.   4. The mitral valve is normal in structure. Mild to moderate mitral valve  regurgitation. No evidence of mitral stenosis.   5. Bioprosthetic aortic valve s/p TAVR. There is a 26 mm Sapien THV.  Mild-moderate perivalvular regurgitation. Mean gradint 12 mmHg with DI  0.52, EOA 1.63 cm^2.   6. The inferior vena cava is normal in size with greater than 50%  respiratory variability, suggesting right atrial pressure of 3 mmHg.   Patient Profile     81 y.o. male w.PMHx of VHD s/p TAVR (July 2023) , CAD (CAC score 682 (62nd percentile) -Apical ischemia 08/29/2020 > -LHC 09/12/2020: D2 80%), HLD, HTN (?), AFib, ATach, symptomatic bradycardia requiring stop of his BB admitted for Tikosyn  Assessment & Plan    Paroxysmal AFib CHA2DS2Vasc is 4, on Eliquis, appropriately dosed Tikosyn load is in progress K+ 4.3 Mag 2.1 Creat 1.05 (stable, calc CrCl is 63) QTc stable  Unlikely to need DCCV NPO after MN for now  VHD Mild-mod perivalvular leak by last echo No symptoms  CAD No symptoms Home meds, no BB with baseline bradycardia  4.  HTN No meds  For questions or updates, please contact [place] Please consult [email or web address] for contact info under        Signed, Baldwin Jamaica, PA-C  07/23/2022, 7:31 AM

## 2022-07-24 ENCOUNTER — Encounter (HOSPITAL_COMMUNITY)
Admission: RE | Disposition: A | Discharge status: Home / Self Care | Payer: Self-pay | Source: Ambulatory Visit | Attending: Cardiovascular Disease

## 2022-07-24 LAB — BASIC METABOLIC PANEL
Anion gap: 5 (ref 5–15)
BUN: 23 mg/dL (ref 8–23)
CO2: 25 mmol/L (ref 22–32)
Calcium: 8.7 mg/dL — ABNORMAL LOW (ref 8.9–10.3)
Chloride: 106 mmol/L (ref 98–111)
Creatinine, Ser: 1.03 mg/dL (ref 0.61–1.24)
GFR, Estimated: >60 mL/min (ref >60)
Glucose, Bld: 99 mg/dL (ref 70–99)
Potassium: 3.9 mmol/L (ref 3.5–5.1)
Sodium: 136 mmol/L (ref 135–145)

## 2022-07-24 LAB — MAGNESIUM: Magnesium: 2.1 mg/dL (ref 1.7–2.4)

## 2022-07-24 SURGERY — CARDIOVERSION
Anesthesia: General

## 2022-07-24 MED ORDER — POTASSIUM CHLORIDE CRYS ER 20 MEQ PO TBCR
20.0000 meq | EXTENDED_RELEASE_TABLET | Freq: Once | ORAL | Status: AC
  Administered 2022-07-24: 20 meq via ORAL
  Filled 2022-07-24: qty 1

## 2022-07-24 NOTE — Progress Notes (Signed)
Pharmacy: Dofetilide (Tikosyn) - Follow Up Assessment and Electrolyte Replacement  Pharmacy consulted to assist in monitoring and replacing electrolytes in this 81 y.o. male admitted on 07/22/2022 undergoing dofetilide initiation.   Labs:    Component Value Date/Time   K 3.9 07/24/2022 0301   MG 2.1 07/24/2022 0301     Plan: Potassium: - Kdur 65mq x1  Magnesium: Mg > 2: No additional supplementation needed    Thank you for allowing pharmacy to participate in this patient's care    AHildred Laser PharmD Clinical Pharmacist **Pharmacist phone directory can now be found on [email or web address] (PW [email or web address]).  Listed under [facility]

## 2022-07-24 NOTE — Progress Notes (Signed)
Rounding Note    Patient Name: Gerald Spears Date of Encounter: 07/23/2022  [facility] Cardiologist: Evalina Field, MD   Subjective   Feels well, re-reviewed goals of Tikosyn, AFib management strategy with pt, wife at bedside this AM  Inpatient Medications    Scheduled Meds:  alfuzosin  10 mg Oral QHS   apixaban  5 mg Oral BID   atorvastatin  10 mg Oral Once per day on Mon Wed Fri   dofetilide  500 mcg Oral BID   doxycycline  100 mg Oral Daily   finasteride  5 mg Oral Q2200   sodium chloride flush  3 mL Intravenous Q12H   Continuous Infusions:  sodium chloride     PRN Meds: sodium chloride, acetaminophen, sodium chloride flush   Vital Signs    Vitals:   07/22/22 1704 07/22/22 2050 07/23/22 0401  BP: 119/70 123/89 133/68  Pulse: 70 (!) 57 (!) 52  Resp: 18    Temp: 97.6 F (36.4 C) 98.5 F (36.9 C) (!) 97.5 F (36.4 C)  TempSrc: Oral Oral Oral  SpO2: 100% 99% 100%  Weight: 82.3 kg    Height: '5\' 11"'$  (1.803 m)     No intake or output data in the 24 hours ending 07/23/22 0731    07/22/2022    5:04 PM 07/22/2022   10:00 AM 06/16/2022    8:38 AM  Last 3 Weights  Weight (lbs) 181 lb 8 oz 182 lb 6.4 oz 183 lb  Weight (kg) 82.328 kg 82.736 kg 83.008 kg      Telemetry    SB/SR 50's-80's, occ-frequent PACs - Personally Reviewed  ECG    SR 61bpm, manually measured QT 436m QTc 4473m- Personally Reviewed  Physical Exam   GEN: No acute distress.   Neck: No JVD Cardiac: RRR, extrasystoles apprecated, no murmurs, rubs, or gallops.  Respiratory: CTA b/l. GI: Soft, nontender, non-distended  MS: No edema; No deformity. Neuro:  Nonfocal  Psych: Normal affect   Labs    High Sensitivity Troponin:  No results for input(s): "TROPONINIHS" in the last 720 hours.   Chemistry Recent Labs  Lab 07/22/22 1053 07/23/22 0207  NA 138 139  K 4.1 4.3  CL 108 104  CO2 25 24  GLUCOSE 79 102*  BUN 20 22  CREATININE 1.10 1.05  CALCIUM 9.5 9.6  MG  2.1 2.1  GFRNONAA >60 >60  ANIONGAP 5 11    Lipids No results for input(s): "CHOL", "TRIG", "HDL", "LABVLDL", "LDLCALC", "CHOLHDL" in the last 168 hours.  HematologyNo results for input(s): "WBC", "RBC", "HGB", "HCT", "MCV", "MCH", "MCHC", "RDW", "PLT" in the last 168 hours. Thyroid No results for input(s): "TSH", "FREET4" in the last 168 hours.  BNPNo results for input(s): "BNP", "PROBNP" in the last 168 hours.  DDimer No results for input(s): "DDIMER" in the last 168 hours.   Radiology    No results found.  Cardiac Studies   14 day monitor: Patient had a min HR of 44 bpm (sinus bradycardia), max HR of 219 bpm (atrial fibrillation), and avg HR of 65 bpm (normal sinus rhythm). Predominant underlying rhythm was Sinus Rhythm. 46 Supraventricular Tachycardia runs occurred, the run with the fastest interval lasting 7 beats with a max rate of 207 bpm, the longest lasting 16 beats with an avg rate of 105 bpm. Atrial Fibrillation occurred (2% burden), ranging from 93-219 bpm (avg of 147 bpm), the longest lasting 6 hours 31 mins with an avg rate of  147 bpm. Supraventricular Tachycardia was detected within +/- 45 seconds of symptomatic patient event(s). Isolated SVEs were frequent (10.4%, [id number]), SVE Couplets were rare (<1.0%, 3388), and SVE Triplets were rare (<1.0%, 466). Isolated VEs were rare (<1.0%, 1484), VE Couplets were rare (<1.0%, 2), and VE Triplets were rare (<1.0%, 2). Triggered events correlated with sinus rhythm with PVCs   Impression: Paroxysmal Afib detected (2% burden, max HR 219). SVT detected (likely atrial tachycardia). Frequent PACs (10.4% burden).    TTE 03/26/22 1. Left ventricular ejection fraction, by estimation, is 55%. The left  ventricle has normal function. The left ventricle has no regional wall  motion abnormalities. There is mild left ventricular hypertrophy. Left  ventricular diastolic parameters are  consistent with Grade II diastolic dysfunction  (pseudonormalization).   2. Right ventricular systolic function is normal. The right ventricular  size is normal. There is normal pulmonary artery systolic pressure. The  estimated right ventricular systolic pressure is 70.6 mmHg.   3. Left atrial size was mildly dilated.   4. The mitral valve is normal in structure. Mild to moderate mitral valve  regurgitation. No evidence of mitral stenosis.   5. Bioprosthetic aortic valve s/p TAVR. There is a 26 mm Sapien THV.  Mild-moderate perivalvular regurgitation. Mean gradint 12 mmHg with DI  0.52, EOA 1.63 cm^2.   6. The inferior vena cava is normal in size with greater than 50%  respiratory variability, suggesting right atrial pressure of 3 mmHg.   Patient Profile     81 y.o. male w.PMHx of VHD s/p TAVR (July 2023) , CAD (CAC score 682 (62nd percentile) -Apical ischemia 08/29/2020 > -LHC 09/12/2020: D2 80%), HLD, HTN (?), AFib, ATach, symptomatic bradycardia requiring stop of his BB admitted for Tikosyn  Assessment & Plan    Paroxysmal AFib CHA2DS2Vasc is 4, on Eliquis, appropriately dosed Tikosyn load is in progress K+ 3.9 Mag 2.1 Creat 1.03 (stable) QTc stable  Remains in SR Anticipate discharge tomorrow  VHD Mild-mod perivalvular leak by last echo No symptoms  CAD No symptoms Home meds, no BB with baseline bradycardia  4.  HTN No meds   For questions or updates, please contact [facility] Please consult [email or web address] for contact info under        Signed, Baldwin Jamaica, PA-C  07/23/2022, 7:31 AM

## 2022-07-24 NOTE — Progress Notes (Signed)
Mobility Specialist - Progress Note   07/24/22 1432  Mobility  Activity Ambulated with assistance in hallway  Level of Assistance Standby assist, set-up cues, supervision of patient - no hands on  Assistive Device None  Distance Ambulated (ft) 470 ft  Activity Response Tolerated well  Mobility Referral Yes  $Mobility charge 1 Mobility    Pre-mobility:76 HR During mobility: 81 HR Post-mobility:60 HR  Pt was received in bed and agreeable to mobility. No complaints during ambulation. Pt was returned to EOB with all needs met.   Franki Monte  Mobility Specialist Please contact via [facility] or Rehab office at [phone]

## 2022-07-24 NOTE — Care Management (Signed)
07-24-22 1109 Case Manager spoke with patient regarding Tikosyn cost. Patient wants to use [facility]. Initial Rx to be sent to [facility] and Rx refills 90 day supply to be sent to [facility]. No further needs identified at this time.

## 2022-07-25 ENCOUNTER — Other Ambulatory Visit (HOSPITAL_COMMUNITY): Payer: Self-pay

## 2022-07-25 LAB — BASIC METABOLIC PANEL
Anion gap: 7 (ref 5–15)
BUN: 23 mg/dL (ref 8–23)
CO2: 24 mmol/L (ref 22–32)
Calcium: 9 mg/dL (ref 8.9–10.3)
Chloride: 107 mmol/L (ref 98–111)
Creatinine, Ser: 1.01 mg/dL (ref 0.61–1.24)
GFR, Estimated: >60 mL/min (ref >60)
Glucose, Bld: 103 mg/dL — ABNORMAL HIGH (ref 70–99)
Potassium: 4.1 mmol/L (ref 3.5–5.1)
Sodium: 138 mmol/L (ref 135–145)

## 2022-07-25 LAB — MAGNESIUM: Magnesium: 2.1 mg/dL (ref 1.7–2.4)

## 2022-07-25 MED ORDER — DOFETILIDE 500 MCG PO CAPS
500.0000 ug | ORAL_CAPSULE | Freq: Two times a day (BID) | ORAL | 5 refills | Status: DC
  Filled 2022-07-25: qty 60, 30d supply, fill #0

## 2022-07-25 NOTE — Progress Notes (Signed)
Pharmacy: Dofetilide (Tikosyn) - Follow Up Assessment and Electrolyte Replacement  Pharmacy consulted to assist in monitoring and replacing electrolytes in this 81 y.o. male admitted on 07/22/2022 undergoing dofetilide initiation.   Labs:    Component Value Date/Time   K 4.1 07/25/2022 0244   MG 2.1 07/25/2022 0244     Plan: Potassium: K >/= 4: No additional supplementation needed  Magnesium: Mg > 2: No additional supplementation needed  I anticipate no need for scheduled potassium replacement at discharge.  Thank you for allowing pharmacy to participate in this patient's care   Hildred Laser, PharmD Clinical Pharmacist **Pharmacist phone directory can now be found on [email or web address] ([email or web address]).  Listed under [place].

## 2022-07-25 NOTE — Plan of Care (Signed)

## 2022-07-25 NOTE — Progress Notes (Signed)
Pt safely discharged. Discharge packet provided with teach-back method. VS wnL and as per flow. IVs removed, Pt verbalized understanding. All questions and concerns addressed. Spouse present for transport.

## 2022-07-25 NOTE — Care Management Important Message (Signed)
Important Message  Patient Details  Name: Gerald Spears MRN: [id number] Date of Birth: Apr 14, 1941   Medicare Important Message Given:  Yes     Shelda Altes 07/25/2022, 11:57 AM

## 2022-07-25 NOTE — Discharge Summary (Signed)
ELECTROPHYSIOLOGY PROCEDURE DISCHARGE SUMMARY    Patient ID: Gerald Spears,  MRN: [id number], DOB/AGE: 1941-08-09 81 y.o.  Admit date: 07/22/2022 Discharge date: 07/25/2022  Primary Care Physician: [name], MD  Primary Cardiologist: Dr. Audie Box Electrophysiologist: Dr. Myles Gip  Primary Discharge Diagnosis:  1.  paroxysmal atrial fibrillation status post Tikosyn loading this admission      CHA2DS2Vasc is 4, on Eliquis, appropriately dosed 2.  ATach  Secondary Discharge Diagnosis:  VHD H/o TAVR CAD HTN No meds HLD  No Known Allergies   Procedures This Admission:  1.  Tikosyn loading   Brief HPI: Gerald Spears is a 81 y.o. male with a past medical history as noted above.  They were referred to EP in the outpatient setting for treatment options of atrial fibrillation, off betablocker with bradycardia.  Risks, benefits, and alternatives to Tikosyn were reviewed with the patient who wished to proceed.    Hospital Course:  The patient was admitted and Tikosyn was initiated.  Renal function and electrolytes were followed during the hospitalization.  The patient's QTc remained stable.  He arrived in SR and maintained SR throughout his stay.  He was monitored until discharge on telemetry which demonstrated SR, intermittently with frequent PACs.  On the day of discharge, he feels well, was examined by Dr Myles Gip who considered the patient stable for discharge to home.  Follow-up has been arranged with the AFib clinic in 1 week and with Dr Mealor/EP APP in 4 weeks.    Tikosyn teaching was completed No new or additional electrolyte replacement for home   Physical Exam: Vitals:   07/24/22 1448 07/24/22 2014 07/25/22 0453 07/25/22 0829  BP: (!) 108/59 128/73 127/67 124/76  Pulse: 63 60 60 63  Resp: '19 18 19 18  '$ Temp: 98 F (36.7 C) 98.7 F (37.1 C) 98.6 F (37 C) 98.7 F (37.1 C)  TempSrc: Oral Oral Oral Oral  SpO2: 97% 97% 90% 99%  Weight:      Height:          GEN- The patient is well appearing, alert and oriented x 3 today.   HEENT: normocephalic, atraumatic; sclera clear, conjunctiva pink; hearing intact; oropharynx clear; neck supple, no JVP Lymph- no cervical lymphadenopathy Lungs-  CTA b/l, normal work of breathing.  No wheezes, rales, rhonchi Heart-  RRR, no murmurs, rubs or gallops, PMI not laterally displaced GI- soft, non-tender, non-distended Extremities- no clubbing, cyanosis, or edema MS- no significant deformity or atrophy Skin- warm and dry, no rash or lesion Psych- euthymic mood, full affect Neuro- strength and sensation are intact   Labs:   Lab Results  Component Value Date   WBC 5.7 02/26/2022   HGB 11.6 (L) 02/26/2022   HCT 34.0 (L) 02/26/2022   MCV 88.5 02/26/2022   PLT 89 (L) 02/26/2022    Recent Labs  Lab 07/25/22 0244  NA 138  K 4.1  CL 107  CO2 24  BUN 23  CREATININE 1.01  CALCIUM 9.0  GLUCOSE 103*     Discharge Medications:  Allergies as of 07/25/2022   No Known Allergies      Medication List     TAKE these medications    acetaminophen 650 MG CR tablet Commonly known as: TYLENOL Take 650 mg by mouth every 8 (eight) hours as needed for pain.   alfuzosin 10 MG 24 hr tablet Commonly known as: UROXATRAL Take 1 tablet (10 mg total) by mouth at bedtime.   amoxicillin 500  MG capsule Commonly known as: AMOXIL Take 2,000 mg by mouth See admin instructions. Take 2000 mg 1 hour prior to dental work   atorvastatin 10 MG tablet Commonly known as: LIPITOR TAKE MON-WEN- AND FRIDAYS What changed:  how much to take how to take this when to take this additional instructions   cholecalciferol 25 MCG (1000 UNIT) tablet Commonly known as: VITAMIN D3 Take 2,000 Units by mouth daily.   cyanocobalamin 1000 MCG tablet Commonly known as: VITAMIN B12 Take 1,000 mcg by mouth daily.   dofetilide 500 MCG capsule Commonly known as: TIKOSYN Take 1 capsule (500 mcg total) by mouth 2 (two) times  daily.   doxycycline 100 MG tablet Commonly known as: VIBRA-TABS Take 100 mg by mouth daily. Continuous   Eliquis 5 MG Tabs tablet Generic drug: apixaban TAKE 1 TABLET BY MOUTH TWICE A DAY   finasteride 5 MG tablet Commonly known as: PROSCAR Take 1 tablet (5 mg total) by mouth daily at 12 noon.   valACYclovir 500 MG tablet Commonly known as: VALTREX Take 500 mg by mouth 2 (two) times daily as needed (Cold sore). for 5 days at onset of cold sore        Disposition: Home Discharge Instructions     Diet - low sodium heart healthy   Complete by: As directed    Increase activity slowly   Complete by: As directed         Duration of Discharge Encounter: Greater than 30 minutes including physician time.  Venetia Night, PA-C 07/25/2022 11:21 AM

## 2022-07-31 ENCOUNTER — Ambulatory Visit (HOSPITAL_COMMUNITY)
Admit: 2022-07-31 | Discharge: 2022-07-31 | Disposition: A | Discharge status: Home / Self Care | Payer: Medicare Other | Attending: Physician Assistant | Admitting: Physician Assistant

## 2022-07-31 ENCOUNTER — Encounter (HOSPITAL_COMMUNITY): Payer: Self-pay | Admitting: Physician Assistant

## 2022-07-31 VITALS — BP 108/78 | HR 53 | Ht 71.0 in | Wt 184.4 lb

## 2022-07-31 DIAGNOSIS — Z79899 Other long term (current) drug therapy: Secondary | ICD-10-CM | POA: Insufficient documentation

## 2022-07-31 DIAGNOSIS — I4510 Unspecified right bundle-branch block: Secondary | ICD-10-CM | POA: Diagnosis not present

## 2022-07-31 DIAGNOSIS — I480 Paroxysmal atrial fibrillation: Secondary | ICD-10-CM | POA: Insufficient documentation

## 2022-07-31 DIAGNOSIS — I350 Nonrheumatic aortic (valve) stenosis: Secondary | ICD-10-CM | POA: Diagnosis not present

## 2022-07-31 DIAGNOSIS — I2510 Atherosclerotic heart disease of native coronary artery without angina pectoris: Secondary | ICD-10-CM | POA: Diagnosis not present

## 2022-07-31 DIAGNOSIS — I38 Endocarditis, valve unspecified: Secondary | ICD-10-CM | POA: Insufficient documentation

## 2022-07-31 DIAGNOSIS — Z7901 Long term (current) use of anticoagulants: Secondary | ICD-10-CM | POA: Diagnosis not present

## 2022-07-31 DIAGNOSIS — Z952 Presence of prosthetic heart valve: Secondary | ICD-10-CM | POA: Insufficient documentation

## 2022-07-31 DIAGNOSIS — R001 Bradycardia, unspecified: Secondary | ICD-10-CM | POA: Insufficient documentation

## 2022-07-31 DIAGNOSIS — E785 Hyperlipidemia, unspecified: Secondary | ICD-10-CM | POA: Diagnosis not present

## 2022-07-31 DIAGNOSIS — D6869 Other thrombophilia: Secondary | ICD-10-CM | POA: Diagnosis not present

## 2022-07-31 LAB — BASIC METABOLIC PANEL
Anion gap: 4 — ABNORMAL LOW (ref 5–15)
BUN: 17 mg/dL (ref 8–23)
CO2: 24 mmol/L (ref 22–32)
Calcium: 8.8 mg/dL — ABNORMAL LOW (ref 8.9–10.3)
Chloride: 102 mmol/L (ref 98–111)
Creatinine, Ser: 1.63 mg/dL — ABNORMAL HIGH (ref 0.61–1.24)
GFR, Estimated: 42 mL/min — ABNORMAL LOW (ref >60)
Glucose, Bld: 83 mg/dL (ref 70–99)
Potassium: 5 mmol/L (ref 3.5–5.1)
Sodium: 130 mmol/L — ABNORMAL LOW (ref 135–145)

## 2022-07-31 LAB — MAGNESIUM: Magnesium: 3.2 mg/dL — ABNORMAL HIGH (ref 1.7–2.4)

## 2022-07-31 MED ORDER — DOFETILIDE 500 MCG PO CAPS: 500.0000 ug | ORAL_CAPSULE | Freq: Two times a day (BID) | ORAL | 1 refills | Status: DC

## 2022-07-31 NOTE — Progress Notes (Signed)
Primary Care Physician: [name], MD Primary Cardiologist: Dr Audie Box Primary Electrophysiologist: Dr Myles Gip Referring Physician: Dr Shelda Jakes is a 81 y.o. male with a history of severe aortic stenosis status post TAVR, persistent atrial fibrillation, CAD, hyperlipidemia who presents for follow up in the [facility]. Patient is on Eliquis for a CHADS2VASC score of 3. He was previously on metoprolol but had symptomatic bradycardia and this was discontinued. However, his afib burden increased. He was seen by Dr Myles Gip who recommended dofetilide vs ablation, patient opted for dofetilide.   On follow up today, patient reports that he has done well since his hospitalization. He is in SR today. He states that he has had much more energy. No bleeding issues on anticoagulation.   Today, he denies symptoms of palpitations, chest pain, shortness of breath, orthopnea, PND, lower extremity edema, dizziness, presyncope, syncope, snoring, daytime somnolence, bleeding, or neurologic sequela. The patient is tolerating medications without difficulties and is otherwise without complaint today.    Atrial Fibrillation Risk Factors:  he does not have symptoms or diagnosis of sleep apnea. he does not have a history of rheumatic fever.   he has a BMI of Body mass index is 25.72 kg/m.Marland Kitchen Filed Weights   07/31/22 1438  Weight: 83.6 kg    Family History  Problem Relation Age of Onset   Heart disease Father    Heart attack Mother        age 51   Breast cancer Sister    Breast cancer Sister      Atrial Fibrillation Management history:  Previous antiarrhythmic drugs: dofetilide  Previous cardioversions: none Previous ablations: none CHADS2VASC score: 3 Anticoagulation history: Eliquis   Past Medical History:  Diagnosis Date   Cancer ([place])    colon cancer and melanoma   Complication of anesthesia    difficult to wake up after surgery    Coronary artery disease    DJD (degenerative joint disease)    Dr. Noemi Chapel end stages of DJD 2011   Hard of hearing    History of hiatal hernia    PAF (paroxysmal atrial fibrillation) (HCC)    S/P TAVR (transcatheter aortic valve replacement) 02/25/2022   S3UR 67m via TF apprach with Dr. TAli Loweand Dr. BCyndia Bent  Severe aortic stenosis    Urinary frequency    UTI (lower urinary tract infection)    Past Surgical History:  Procedure Laterality Date   ANKLE SURGERY Right    COLONOSCOPY W/ BIOPSIES AND POLYPECTOMY     ELBOW SURGERY Right    EYE SURGERY     HERNIA REPAIR     INTRAOPERATIVE TRANSTHORACIC ECHOCARDIOGRAM N/A 02/25/2022   Procedure: INTRAOPERATIVE TRANSTHORACIC ECHOCARDIOGRAM;  Surgeon: TEarly Osmond MD;  [facility];  Service: Open Heart Surgery;  Laterality: N/A;   JOINT REPLACEMENT     KNEE SURGERY Right    x4   LEFT HEART CATH AND CORONARY ANGIOGRAPHY N/A 09/12/2020   Procedure: LEFT HEART CATH AND CORONARY ANGIOGRAPHY;  Surgeon: CSherren Mocha MD;  [facility];  Service: Cardiovascular;  Laterality: N/A;   MULTIPLE TOOTH EXTRACTIONS     RIGHT/LEFT HEART CATH AND CORONARY ANGIOGRAPHY N/A 01/23/2022   Procedure: RIGHT/LEFT HEART CATH AND CORONARY ANGIOGRAPHY;  Surgeon: CSherren Mocha MD;  [facility];  Service: Cardiovascular;  Laterality: N/A;   TOTAL KNEE ARTHROPLASTY Right    TOTAL KNEE ARTHROPLASTY Left 06/18/2015   Procedure: TOTAL  KNEE ARTHROPLASTY;  Surgeon: Frederik Pear, MD;  Location: [facility];  Service: Orthopedics;  Laterality: Left;   TOTAL KNEE REVISION Right 02/11/2021   Procedure: RIGHT TOTAL KNEE REVISION;  Surgeon: Frederik Pear, MD;  Location: [facility];  Service: Orthopedics;  Laterality: Right;   TRANSCATHETER AORTIC VALVE REPLACEMENT, TRANSFEMORAL N/A 02/25/2022   Procedure: Transcatheter Aortic Valve Replacement, Transfemoral;  Surgeon: Early Osmond, MD;  Location: [facility];  Service: Open  Heart Surgery;  Laterality: N/A;    Current Outpatient Medications  Medication Sig Dispense Refill   acetaminophen (TYLENOL) 650 MG CR tablet Take 650 mg by mouth every 8 (eight) hours as needed for pain.     alfuzosin (UROXATRAL) 10 MG 24 hr tablet Take 1 tablet (10 mg total) by mouth at bedtime. 30 tablet 11   amoxicillin (AMOXIL) 500 MG capsule Take 2,000 mg by mouth See admin instructions. Take 2000 mg 1 hour prior to dental work     atorvastatin (LIPITOR) 10 MG tablet TAKE MON-WEN- AND FRIDAYS (Patient taking differently: Take 10 mg by mouth daily.) 90 tablet 3   cholecalciferol (VITAMIN D3) 25 MCG (1000 UNIT) tablet Take 2,000 Units by mouth daily.     dofetilide (TIKOSYN) 500 MCG capsule Take 1 capsule (500 mcg total) by mouth 2 (two) times daily. 60 capsule 5   doxycycline (VIBRA-TABS) 100 MG tablet Take 100 mg by mouth daily. Continuous     ELIQUIS 5 MG TABS tablet TAKE 1 TABLET BY MOUTH TWICE A DAY 180 tablet 1   finasteride (PROSCAR) 5 MG tablet Take 1 tablet (5 mg total) by mouth daily at 12 noon. 90 tablet 3   valACYclovir (VALTREX) 500 MG tablet Take 500 mg by mouth 2 (two) times daily as needed (Cold sore). for 5 days at onset of cold sore  2   vitamin B-12 (CYANOCOBALAMIN) 1000 MCG tablet Take 1,000 mcg by mouth daily.     No current facility-administered medications for this encounter.    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  Tobacco Use   Smoking status: Never   Smokeless tobacco: Never   Tobacco comments:    Never smoke 07/22/22  Vaping Use   Vaping Use: Never used  Substance and Sexual Activity   Alcohol use: No   Drug use: No   Sexual activity: Yes  Other Topics Concern   Not on file  Social History Narrative   Not on file    Social Determinants of Health   Financial Resource Strain: Not on file  Food Insecurity: Unknown (07/22/2022)   Hunger Vital Sign    Worried About Running Out of Food in the Last Year: Patient refused    Bend in the Last Year: Patient refused  Transportation Needs: No Transportation Needs (07/22/2022)   PRAPARE - Hydrologist (Medical): No    Lack of Transportation (Non-Medical): No  Physical Activity: Not on file  Stress: Not on file  Social Connections: Not on file  Intimate Partner Violence: Not At Risk (07/22/2022)   Humiliation, Afraid, Rape, and Kick questionnaire    Fear of Current or Ex-Partner: No    Emotionally Abused: No    Physically Abused:  No    Sexually Abused: No     ROS- All systems are reviewed and negative except as per the HPI above.  Physical Exam: Vitals:   07/31/22 1438  BP: 108/78  Pulse: (!) 53  Weight: 83.6 kg  Height: '5\' 11"'$  (1.803 m)    GEN- The patient is a well appearing elderly male, alert and oriented x 3 today.   HEENT-head normocephalic, atraumatic, sclera clear, conjunctiva pink, hearing intact, trachea midline. Lungs- Clear to ausculation bilaterally, normal work of breathing Heart- Regular rate and rhythm, no murmurs, rubs or gallops  GI- soft, NT, ND, + BS Extremities- no clubbing, cyanosis, or edema MS- no significant deformity or atrophy Skin- no rash or lesion Psych- euthymic mood, full affect Neuro- strength and sensation are intact   Wt Readings from Last 3 Encounters:  07/31/22 83.6 kg  07/22/22 82.3 kg  07/22/22 82.7 kg    EKG today demonstrates  SB Vent. rate 53 BPM PR interval 118 ms QRS duration 100 ms QT/QTcB 460/431 ms  Echo 03/26/22 demonstrated   1. Left ventricular ejection fraction, by estimation, is 55%. The left  ventricle has normal function. The left ventricle has no regional wall  motion abnormalities. There is mild left ventricular hypertrophy. Left   ventricular diastolic parameters are consistent with Grade II diastolic dysfunction (pseudonormalization).   2. Right ventricular systolic function is normal. The right ventricular  size is normal. There is normal pulmonary artery systolic pressure. The  estimated right ventricular systolic pressure is 84.6 mmHg.   3. Left atrial size was mildly dilated.   4. The mitral valve is normal in structure. Mild to moderate mitral valve  regurgitation. No evidence of mitral stenosis.   5. Bioprosthetic aortic valve s/p TAVR. There is a 26 mm Sapien THV.  Mild-moderate perivalvular regurgitation. Mean gradint 12 mmHg with DI  0.52, EOA 1.63 cm^2.   6. The inferior vena cava is normal in size with greater than 50%  respiratory variability, suggesting right atrial pressure of 3 mmHg.   Epic records are reviewed at length today  CHA2DS2-VASc Score = 3  The patient's score is based upon: CHF History: 0 HTN History: 0 Diabetes History: 0 Stroke History: 0 Vascular Disease History: 1 Age Score: 2 Gender Score: 0       ASSESSMENT AND PLAN: 1. Paroxysmal Atrial Fibrillation/atrial tach The patient's CHA2DS2-VASc score is 3, indicating a 3.2% annual risk of stroke.   S/p dofetilide admission 12/5-12/8/23. Patient appears to be maintaining SR.  Continue dofetilide 500 mcg BID. QT stable. Check bmet/mag today. Continue Eliquis 5 mg BID Off AV nodal agents with h/o symptomatic bradycardia.   2. Secondary Hypercoagulable State (ICD10:  D68.69) The patient is at significant risk for stroke/thromboembolism based upon his CHA2DS2-VASc Score of 3.  Continue Apixaban (Eliquis).   3. Valvular heart disease Severe AS s/p TAVR Mild to moderate MR  4. CAD No anginal symptoms.   Follow up with Dr Myles Gip as scheduled. AF clinic in 4 months.    [facility] [address] [place], [place] [address] [phone] 07/31/2022 3:03 PM

## 2022-08-01 ENCOUNTER — Other Ambulatory Visit (HOSPITAL_COMMUNITY): Payer: Self-pay | Admitting: *Deleted

## 2022-08-01 DIAGNOSIS — I4819 Other persistent atrial fibrillation: Secondary | ICD-10-CM

## 2022-08-07 ENCOUNTER — Encounter: Payer: Self-pay | Admitting: Neurology

## 2022-08-07 ENCOUNTER — Ambulatory Visit (INDEPENDENT_AMBULATORY_CARE_PROVIDER_SITE_OTHER): Payer: Medicare Other | Admitting: Neurology

## 2022-08-07 VITALS — BP 134/72 | HR 56 | Ht 71.0 in | Wt 187.0 lb

## 2022-08-07 DIAGNOSIS — G3184 Mild cognitive impairment, so stated: Secondary | ICD-10-CM | POA: Diagnosis not present

## 2022-08-07 NOTE — Patient Instructions (Signed)
ATN profile to look for Alzheimer biomarker's Continue your other medications  Ok to hold Aricept for now and continue with non pharmacological recommendations  Follow up in a year of sooner if worse    There are well-accepted and sensible ways to reduce risk for Alzheimers disease and other degenerative brain disorders .  Exercise Daily Walk A daily 20 minute walk should be part of your routine. Disease related apathy can be a significant roadblock to exercise and the only way to overcome this is to make it a daily routine and perhaps have a reward at the end (something your loved one loves to eat or drink perhaps) or a personal trainer coming to the home can also be very useful. Most importantly, the patient is much more likely to exercise if the caregiver / spouse does it with him/her. In general a structured, repetitive schedule is best.  General Health: Any diseases which effect your body will effect your brain such as a pneumonia, urinary infection, blood clot, heart attack or stroke. Keep contact with your primary care doctor for regular follow ups.  Sleep. A good nights sleep is healthy for the brain. Seven hours is recommended. If you have insomnia or poor sleep habits we can give you some instructions. If you have sleep apnea wear your mask.  Diet: Eating a heart healthy diet is also a good idea; fish and poultry instead of red meat, nuts (mostly non-peanuts), vegetables, fruits, olive oil or canola oil (instead of butter), minimal salt (use other spices to flavor foods), whole grain rice, bread, cereal and pasta and wine in moderation.Research is now showing that the MIND diet, which is a combination of The Mediterranean diet and the DASH diet, is beneficial for cognitive processing and longevity. Information about this diet can be found in The MIND Diet, a book by Doyne Keel, MS, RDN, and online at [employer].si  Finances, Power of Attorney and Advance  Directives: You should consider putting legal safeguards in place with regard to financial and medical decision making. While the spouse always has power of attorney for medical and financial issues in the absence of any form, you should consider what you want in case the spouse / caregiver is no longer around or capable of making decisions.

## 2022-08-07 NOTE — Progress Notes (Signed)
[facility]  PATIENT: Gerald Spears DOB: Jun 11, 1941  REQUESTING CLINICIAN: [name], MD HISTORY FROM: Patient and spouse  REASON FOR VISIT: Memory decline    HISTORICAL  CHIEF COMPLAINT:  Chief Complaint  Patient presents with   Memory Loss    Rm 13 with spouse Gerald Spears Pt is well, spouse states he had a heart procedure and covid 2 yrs ago. He was having memory concerns prior to procedure but it gradually worsen and became more noticeable.     HISTORY OF PRESENT ILLNESS:  This is a 81 year old gentleman past medical history of CAD, atrial fibrillation, hyperlipidemia who is presenting with memory problem.  Patient realizes that his memory is not what it used to be.  He reports sometimes is forgetful about recent events.  Per wife patient memory decline has been going on for the past 2 years and worse in the last 6 months.  She noted that patient sometimes in the morning will take a shower and then forget that he took a shower and will want to take a shower again.  He will also feed the dog and does not remember that he fed the dog and would like to feed the dogs again.  He is repetitive during conversation and asked the same question over and over.  He does misplace items and again has been checking the mailbox multiple times. 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  Wife reported it is sometimes difficult to have a conversation because he is not following either by repeating or saying the same things.  He is able to drive short distance, is able to take care of himself.  Wife is handling the bills, she has always done so.    TBI:   No past history of TBI, had mild concussions  Stroke:   no past history of stroke Seizures:   no past history of seizures Sleep:   no history of sleep apnea.  Mood:   patient denies anxiety and depression Family history of Dementia: Mother   Functional status: independent  in most ADLs and IADLs Patient lives with spouse. Cooking: wife  Cleaning: wife  Shopping: spouse  Bathing: patient, no help needed Toileting: patient, no help needed  Driving: Occasional  Bills: Wife, she has always done   Ever left the stove on by accident?: N/A  Forget how to use items around the house?: Denies  Getting lost going to familiar places?: Denies  Forgetting loved ones names?: Denies  Word finding difficulty? Denies  Sleep: Good    OTHER MEDICAL CONDITIONS: CAD, Atrial fibrillation, Hyperlipidemia, Vitamin B 12 deficiency,    REVIEW OF SYSTEMS: Full 14 system review of systems performed and negative with exception of: As noted in the HPI   ALLERGIES: No Known Allergies  HOME MEDICATIONS: Outpatient Medications Prior to Visit  Medication Sig Dispense Refill   acetaminophen (TYLENOL) 650 MG CR tablet Take 650 mg by mouth every 8 (eight) hours as needed for pain.     alfuzosin (UROXATRAL) 10 MG 24 hr tablet Take 1 tablet (10 mg total) by mouth at bedtime. 30 tablet 11   amoxicillin (AMOXIL) 500 MG capsule Take 2,000 mg by mouth See admin instructions. Take 2000 mg 1 hour prior to dental work     atorvastatin (LIPITOR) 10 MG tablet TAKE MON-WEN- AND FRIDAYS (Patient taking differently: Take 10 mg by mouth daily.) 90 tablet 3   cholecalciferol (VITAMIN D3) 25 MCG (  1000 UNIT) tablet Take 2,000 Units by mouth daily.     dofetilide (TIKOSYN) 500 MCG capsule Take 1 capsule (500 mcg total) by mouth 2 (two) times daily. 180 capsule 1   doxycycline (VIBRA-TABS) 100 MG tablet Take 100 mg by mouth daily. Continuous     ELIQUIS 5 MG TABS tablet TAKE 1 TABLET BY MOUTH TWICE A DAY 180 tablet 1   finasteride (PROSCAR) 5 MG tablet Take 1 tablet (5 mg total) by mouth daily at 12 noon. 90 tablet 3   valACYclovir (VALTREX) 500 MG tablet Take 500 mg by mouth 2 (two) times daily as needed (Cold sore). for 5 days at onset of cold sore  2   vitamin B-12 (CYANOCOBALAMIN) 1000 MCG tablet  Take 1,000 mcg by mouth daily.     No facility-administered medications prior to visit.    PAST MEDICAL HISTORY: Past Medical History:  Diagnosis Date   Cancer [facility])    colon cancer and melanoma   Complication of anesthesia    difficult to wake up after surgery   Coronary artery disease    DJD (degenerative joint disease)    Dr. Noemi Chapel end stages of DJD 2011   Hard of hearing    History of hiatal hernia    PAF (paroxysmal atrial fibrillation) (HCC)    S/P TAVR (transcatheter aortic valve replacement) 02/25/2022   S3UR 60m via TF apprach with Dr. TAli Loweand Dr. BCyndia Bent  Severe aortic stenosis    Urinary frequency    UTI (lower urinary tract infection)     PAST SURGICAL HISTORY: Past Surgical History:  Procedure Laterality Date   ANKLE SURGERY Right    COLONOSCOPY W/ BIOPSIES AND POLYPECTOMY     ELBOW SURGERY Right    EYE SURGERY     HERNIA REPAIR     INTRAOPERATIVE TRANSTHORACIC ECHOCARDIOGRAM N/A 02/25/2022   Procedure: INTRAOPERATIVE TRANSTHORACIC ECHOCARDIOGRAM;  Surgeon: TEarly Osmond MD;  [facility];  Service: Open Heart Surgery;  Laterality: N/A;   JOINT REPLACEMENT     KNEE SURGERY Right    x4   LEFT HEART CATH AND CORONARY ANGIOGRAPHY N/A 09/12/2020   Procedure: LEFT HEART CATH AND CORONARY ANGIOGRAPHY;  Surgeon: CSherren Mocha MD;  [facility];  Service: Cardiovascular;  Laterality: N/A;   MULTIPLE TOOTH EXTRACTIONS     RIGHT/LEFT HEART CATH AND CORONARY ANGIOGRAPHY N/A 01/23/2022   Procedure: RIGHT/LEFT HEART CATH AND CORONARY ANGIOGRAPHY;  Surgeon: CSherren Mocha MD;  [facility];  Service: Cardiovascular;  Laterality: N/A;   TOTAL KNEE ARTHROPLASTY Right    TOTAL KNEE ARTHROPLASTY Left 06/18/2015   Procedure: TOTAL KNEE ARTHROPLASTY;  Surgeon: FFrederik Pear MD;  [facility]  Service: Orthopedics;  Laterality: Left;   TOTAL KNEE REVISION Right 02/11/2021   Procedure: RIGHT TOTAL KNEE REVISION;   Surgeon: RFrederik Pear MD;  Location: [facility];  Service: Orthopedics;  Laterality: Right;   TRANSCATHETER AORTIC VALVE REPLACEMENT, TRANSFEMORAL N/A 02/25/2022   Procedure: Transcatheter Aortic Valve Replacement, Transfemoral;  Surgeon: TEarly Osmond MD;  [facility];  Service: Open Heart Surgery;  Laterality: N/A;    FAMILY HISTORY: Family History  Problem Relation Age of Onset   Heart disease Father    Heart attack Mother        age 82  Breast cancer Sister    Breast cancer Sister     SOCIAL HISTORY: Social History   Socioeconomic History   Marital status:  Married    Spouse name: Not on file   Number of children: 4   Years of education: Not on file   Highest education level: Not on file  Occupational History   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  Tobacco Use   Smoking status: Never   Smokeless tobacco: Never   Tobacco comments:    Never smoke 07/22/22  Vaping Use   Vaping Use: Never used  Substance and Sexual Activity   Alcohol use: No   Drug use: No   Sexual activity: Yes  Other Topics Concern   Not on file  Social History Narrative   Not on file   Social Determinants of Health   Financial Resource Strain: Not on file  Food Insecurity: Unknown (07/22/2022)   Hunger Vital Sign    Worried About Running Out of Food in the Last Year: Patient refused    [place] in the Last Year: Patient refused  Transportation Needs: No Transportation Needs (07/22/2022)   PRAPARE - Hydrologist (Medical): No    Lack of Transportation (Non-Medical): No  Physical Activity: Not on file  Stress: Not on file  Social Connections: Not on file  Intimate Partner Violence: Not At Risk (07/22/2022)   Humiliation, Afraid, Rape, and Kick questionnaire    Fear of Current or Ex-Partner: No    Emotionally Abused: No    Physically Abused: No    Sexually Abused: No    PHYSICAL EXAM  GENERAL EXAM/CONSTITUTIONAL: Vitals:  Vitals:    08/07/22 1457  BP: 134/72  Pulse: (!) 56  Weight: 187 lb (84.8 kg)  Height: '5\' 11"'$  (1.803 m)   Body mass index is 26.08 kg/m. Wt Readings from Last 3 Encounters:  08/07/22 187 lb (84.8 kg)  07/31/22 184 lb 6.4 oz (83.6 kg)  07/22/22 181 lb 8 oz (82.3 kg)   Patient is in no distress; well developed, nourished and groomed; neck is supple   EYES: Visual fields full to confrontation, Extraocular movements intacts,   MUSCULOSKELETAL: Gait, strength, tone, movements noted in Neurologic exam below  NEUROLOGIC: MENTAL STATUS:      No data to display            08/07/2022    2:59 PM  Montreal Cognitive Assessment   Visuospatial/ Executive (0/5) 3  Naming (0/3) 2  Attention: Read list of digits (0/2) 2  Attention: Read list of letters (0/1) 1  Attention: Serial 7 subtraction starting at 100 (0/3) 0  Language: Repeat phrase (0/2) 2  Language : Fluency (0/1) 0  Abstraction (0/2) 2  Delayed Recall (0/5) 2  Orientation (0/6) 4  Total 18     CRANIAL NERVE:  2nd, 3rd, 4th, 6th- visual fields full to confrontation, extraocular muscles intact, no nystagmus 5th - facial sensation symmetric 7th - facial strength symmetric 8th - hearing intact 9th - palate elevates symmetrically, uvula midline 11th - shoulder shrug symmetric 12th - tongue protrusion midline  MOTOR:  normal bulk and tone, full strength in the BUE, BLE  SENSORY:  normal and symmetric to light touch  COORDINATION:  finger-nose-finger, fine finger movements normal  GAIT/STATION:  normal     DIAGNOSTIC DATA (LABS, IMAGING, TESTING) - I reviewed patient records, labs, notes, testing and imaging myself where available.  Lab Results  Component Value Date   WBC 5.7 02/26/2022   HGB 11.6 (L) 02/26/2022   HCT 34.0 (L) 02/26/2022   MCV 88.5 02/26/2022   PLT 89 (L) 02/26/2022  Component Value Date/Time   NA 130 (L) 07/31/2022 1422   NA 137 01/09/2022 1224   K 5.0 07/31/2022 1422   CL 102  07/31/2022 1422   CO2 24 07/31/2022 1422   GLUCOSE 83 07/31/2022 1422   BUN 17 07/31/2022 1422   BUN 22 01/09/2022 1224   CREATININE 1.63 (H) 07/31/2022 1422   CALCIUM 8.8 (L) 07/31/2022 1422   PROT 6.7 02/21/2022 0923   ALBUMIN 4.1 02/21/2022 0923   AST 24 02/21/2022 0923   ALT 19 02/21/2022 0923   ALKPHOS 58 02/21/2022 0923   BILITOT 1.3 (H) 02/21/2022 0923   GFRNONAA 42 (L) 07/31/2022 1422   GFRAA 86 09/07/2020 1219   No results found for: "CHOL", "HDL", "LDLCALC", "LDLDIRECT", "TRIG", "CHOLHDL" No results found for: "HGBA1C" No results found for: "VITAMINB12" Lab Results  Component Value Date   TSH 3.490 08/23/2020   TSH 3.96  06/18/21 Vit B 12 194  06/18/21   MRI Brain 07/17/2022 1. No reversible cause for symptoms. 2. Moderate brain atrophy that is similar to a 2022 head CT.   ASSESSMENT AND PLAN  81 y.o. year old male with medical history including CAD, atrial fibrillation, hyperlipidemia, who is presenting with memory problem described as being forgetful, repetitive, asking the same questions.  On exam today he scored a 18 out of 30 indicating of impairment.  Based on history and physical finding, I informed both patient and spouse that he likely has mild cognitive impairment.  I informed him that I will obtain the ATN profile to look for Alzheimer's biomarker.  They are comfortable with plans.   In discussing about medication I have suggested starting Aricept at the moment but they would like to decline as patient is on Tikosyn for his atrial fibrillation and they will ask the cardiologist about any interaction between Tikosyn and Aricept.  They would also like to start first with nonpharmacologic recommendations including exercise, diet, good sleep and maintaining good health.  I think it is reasonable plan.  Advised them to contact me sooner if he has increased agitation, depression, or irritability.  Otherwise I will see them in 1 year for follow-up.  They voiced  understanding.   1. Mild cognitive impairment      Patient Instructions  ATN profile to look for Alzheimer biomarker's Continue your other medications  Ok to hold Aricept for now and continue with non pharmacological recommendations  Follow up in a year of sooner if worse    There are well-accepted and sensible ways to reduce risk for Alzheimers disease and other degenerative brain disorders .  Exercise Daily Walk A daily 20 minute walk should be part of your routine. Disease related apathy can be a significant roadblock to exercise and the only way to overcome this is to make it a daily routine and perhaps have a reward at the end (something your loved one loves to eat or drink perhaps) or a personal trainer coming to the home can also be very useful. Most importantly, the patient is much more likely to exercise if the caregiver / spouse does it with him/her. In general a structured, repetitive schedule is best.  General Health: Any diseases which effect your body will effect your brain such as a pneumonia, urinary infection, blood clot, heart attack or stroke. Keep contact with your primary care doctor for regular follow ups.  Sleep. A good nights sleep is healthy for the brain. Seven hours is recommended. If you have insomnia or poor sleep habits  we can give you some instructions. If you have sleep apnea wear your mask.  Diet: Eating a heart healthy diet is also a good idea; fish and poultry instead of red meat, nuts (mostly non-peanuts), vegetables, fruits, olive oil or canola oil (instead of butter), minimal salt (use other spices to flavor foods), whole grain rice, bread, cereal and pasta and wine in moderation.Research is now showing that the MIND diet, which is a combination of The Mediterranean diet and the DASH diet, is beneficial for cognitive processing and longevity. Information about this diet can be found in The MIND Diet, a book by Doyne Keel, MS, RDN, and online at  [employer]book[employer].si  Finances, Power of Attorney and Advance Directives: You should consider putting legal safeguards in place with regard to financial and medical decision making. While the spouse always has power of attorney for medical and financial issues in the absence of any form, you should consider what you want in case the spouse / caregiver is no longer around or capable of making decisions.   Orders Placed This Encounter  Procedures   ATN PROFILE    No orders of the defined types were placed in this encounter.   Return in about 1 year (around 08/08/2023).  I have spent a total of 80 minutes dedicated to this patient today, preparing to see patient, performing a medically appropriate examination and evaluation, ordering tests and/or medications and procedures, and counseling and educating the patient/family/caregiver; independently interpreting result and communicating results to the family/patient/caregiver; and documenting clinical information in the electronic medical record.   Alric Ran, MD 08/07/2022, 4:31 PM  [facility] [address], [place], [place] [address] [phone]

## 2022-08-07 NOTE — Progress Notes (Signed)
Cardiology Office Note:   Date:  08/08/2022  NAME:  Gerald Spears    MRN: [id number] DOB:  [birth year]   PCP:  [name], MD  Cardiologist:  Evalina Field, MD  Electrophysiologist:  Melida Quitter, MD   Referring MD: [name], MD   Chief Complaint  Patient presents with   Follow-up   History of Present Illness:   Gerald Spears is a 81 y.o. male with a hx of severe AS s/p TAVR, CAD, pAF on Tikosyn who presents for follow-up. Evaluated by EP and is now on Tikosyn.  He presents with his wife.  Reports he is doing well.  No chest pain or trouble breathing.  No recurrence of atrial fibrillation since started on dofetilide.  No bleeding on Eliquis.  Overall he is doing well.  Without any major complaints in office.  He was recently seen by neurologist and diagnosed with mild cognitive impairment.  Aricept was recommended.  They would like to know if this is okay while on Tikosyn.  Will reach out to electrophysiology to determine if this is okay.  He also had recent lab work which showed an acute kidney injury.  He needs repeat lab work today.  Problem List 1. Severe aortic stenosis -26 mm S3 TAVR 02/25/2022 2. Mitral regurgitation, moderate -09/27/2018 3. CAD -CAC score 682 (62nd percentile) -Apical ischemia 08/29/2020 -LHC 09/12/2020: D2 80% 4. HLD -T chol 180, LDL 106, HDL 61, TG 71 5. PACs -5.8% burden -brief ectopic atrial tachycardia  6. Atrial fibrillation, persistent 02/19/2021 -converted to NSR in ER -CHADSVASC= 3 (age, CAD) -On Tikosyn 7.  Mild cognitive impairment  Past Medical History: Past Medical History:  Diagnosis Date   Cancer ([place])    colon cancer and melanoma   Complication of anesthesia    difficult to wake up after surgery   Coronary artery disease    DJD (degenerative joint disease)    Dr. Noemi Chapel end stages of DJD 2011   Hard of hearing    History of hiatal hernia    PAF (paroxysmal atrial fibrillation) (HCC)    S/P TAVR  (transcatheter aortic valve replacement) 02/25/2022   S3UR 63m via TF apprach with Dr. TAli Loweand Dr. BCyndia Bent  Severe aortic stenosis    Urinary frequency    UTI (lower urinary tract infection)     Past Surgical History: Past Surgical History:  Procedure Laterality Date   ANKLE SURGERY Right    COLONOSCOPY W/ BIOPSIES AND POLYPECTOMY     ELBOW SURGERY Right    EYE SURGERY     HERNIA REPAIR     INTRAOPERATIVE TRANSTHORACIC ECHOCARDIOGRAM N/A 02/25/2022   Procedure: INTRAOPERATIVE TRANSTHORACIC ECHOCARDIOGRAM;  Surgeon: TEarly Osmond MD;  [facility];  Service: Open Heart Surgery;  Laterality: N/A;   JOINT REPLACEMENT     KNEE SURGERY Right    x4   LEFT HEART CATH AND CORONARY ANGIOGRAPHY N/A 09/12/2020   Procedure: LEFT HEART CATH AND CORONARY ANGIOGRAPHY;  Surgeon: CSherren Mocha MD;  [facility];  Service: Cardiovascular;  Laterality: N/A;   MULTIPLE TOOTH EXTRACTIONS     RIGHT/LEFT HEART CATH AND CORONARY ANGIOGRAPHY N/A 01/23/2022   Procedure: RIGHT/LEFT HEART CATH AND CORONARY ANGIOGRAPHY;  Surgeon: CSherren Mocha MD;  [facility];  Service: Cardiovascular;  Laterality: N/A;   TOTAL KNEE ARTHROPLASTY Right    TOTAL KNEE ARTHROPLASTY Left 06/18/2015   Procedure: TOTAL KNEE ARTHROPLASTY;  Surgeon: FFrederik Pear MD;  Location: [facility];  Service: Orthopedics;  Laterality: Left;   TOTAL KNEE REVISION Right 02/11/2021   Procedure: RIGHT TOTAL KNEE REVISION;  Surgeon: Frederik Pear, MD;  [facility];  Service: Orthopedics;  Laterality: Right;   TRANSCATHETER AORTIC VALVE REPLACEMENT, TRANSFEMORAL N/A 02/25/2022   Procedure: Transcatheter Aortic Valve Replacement, Transfemoral;  Surgeon: Early Osmond, MD;  Location: [facility];  Service: Open Heart Surgery;  Laterality: N/A;    Current Medications: Current Meds  Medication Sig   acetaminophen (TYLENOL) 650 MG CR tablet Take 650 mg by mouth every 8 (eight) hours as  needed for pain.   alfuzosin (UROXATRAL) 10 MG 24 hr tablet Take 1 tablet (10 mg total) by mouth at bedtime.   amoxicillin (AMOXIL) 500 MG capsule Take 2,000 mg by mouth See admin instructions. Take 2000 mg 1 hour prior to dental work   atorvastatin (LIPITOR) 10 MG tablet TAKE MON-WEN- AND FRIDAYS (Patient taking differently: Take 10 mg by mouth daily.)   cholecalciferol (VITAMIN D3) 25 MCG (1000 UNIT) tablet Take 2,000 Units by mouth daily.   dofetilide (TIKOSYN) 500 MCG capsule Take 1 capsule (500 mcg total) by mouth 2 (two) times daily.   doxycycline (VIBRA-TABS) 100 MG tablet Take 100 mg by mouth daily. Continuous   ELIQUIS 5 MG TABS tablet TAKE 1 TABLET BY MOUTH TWICE A DAY   finasteride (PROSCAR) 5 MG tablet Take 1 tablet (5 mg total) by mouth daily at 12 noon.   valACYclovir (VALTREX) 500 MG tablet Take 500 mg by mouth 2 (two) times daily as needed (Cold sore). for 5 days at onset of cold sore   vitamin B-12 (CYANOCOBALAMIN) 1000 MCG tablet Take 1,000 mcg by mouth daily.     Allergies:    Patient has no known allergies.   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  Tobacco Use   Smoking status: Never   Smokeless tobacco: Never   Tobacco comments:    Never smoke 07/22/22  Vaping Use   Vaping Use: Never used  Substance and Sexual Activity   Alcohol use: No   Drug use: No   Sexual activity: Yes  Other Topics Concern   Not on file  Social History Narrative   Not on file   Social Determinants of Health   Financial Resource Strain: Not on file  Food Insecurity: Unknown (07/22/2022)   Hunger Vital Sign    Worried About Running Out of Food in the Last Year: Patient refused    [place] in the Last Year: Patient refused  Transportation Needs: No  Transportation Needs (07/22/2022)   PRAPARE - Hydrologist (Medical): No    Lack of Transportation (Non-Medical): No  Physical Activity: Not on file  Stress: Not on file  Social Connections: Not on file     Family History: The patient's family history includes Breast cancer in his sister and sister; Heart attack in his mother; Heart disease in his father.  ROS:   All other ROS reviewed and negative. Pertinent positives noted in the HPI.     EKGs/Labs/Other Studies Reviewed:   The following studies were personally reviewed by me today:  Recent Labs: 02/21/2022: ALT 19 02/26/2022: Hemoglobin 11.6;  Platelets 89 07/31/2022: BUN 17; Creatinine, Ser 1.63; Magnesium 3.2; Potassium 5.0; Sodium 130   Recent Lipid Panel No results found for: "CHOL", "TRIG", "HDL", "CHOLHDL", "VLDL", "LDLCALC", "LDLDIRECT"  Physical Exam:   VS:  BP 116/72   Pulse 66   Ht '5\' 11"'$  (1.803 m)   Wt 186 lb 3.2 oz (84.5 kg)   SpO2 98%   BMI 25.97 kg/m    Wt Readings from Last 3 Encounters:  08/08/22 186 lb 3.2 oz (84.5 kg)  08/07/22 187 lb (84.8 kg)  07/31/22 184 lb 6.4 oz (83.6 kg)    General: Well nourished, well developed, in no acute distress Head: Atraumatic, normal size  Eyes: PEERLA, EOMI  Neck: Supple, no JVD Endocrine: No thryomegaly Cardiac: Normal S1, S2; RRR; no murmurs, rubs, or gallops Lungs: Clear to auscultation bilaterally, no wheezing, rhonchi or rales  Abd: Soft, nontender, no hepatomegaly  Ext: No edema, pulses 2+ Musculoskeletal: No deformities, BUE and BLE strength normal and equal Skin: Warm and dry, no rashes   Neuro: Alert and oriented to person, place, time, and situation, CNII-XII grossly intact, no focal deficits  Psych: Normal mood and affect   ASSESSMENT:   DEQUANDRE CORDOVA is a 81 y.o. male who presents for the following: 1. PAF (paroxysmal atrial fibrillation) ([place])   2. Acquired thrombophilia ([place])   3. Bradycardia   4. S/P TAVR  (transcatheter aortic valve replacement)   5. Coronary artery disease involving native coronary artery of native heart with angina pectoris ([place])   6. Hypercholesterolemia   7. Persistent atrial fibrillation ([place])     PLAN:   1. PAF (paroxysmal atrial fibrillation) ([place]) 2. Acquired thrombophilia ([place]) 3. Bradycardia -Maintaining sinus rhythm on Tikosyn.  On Eliquis as well.  Recent lab work with AKI.  Recheck labs today.  Denies any palpitations or rapid heartbeat sensation.  Overall doing well.  Recently recommend to start Aricept by his neurologist.  We will reach out to EP to see if this is okay while on dofetilide.  4. S/P TAVR (transcatheter aortic valve replacement) -Stable.  No issues.  He understands he needs antibiotic prophylaxis for SBE.  5. Coronary artery disease involving native coronary artery of native heart with angina pectoris ([place]) 6. Hypercholesterolemia -Single-vessel CAD.  No symptoms.  Managed medically.  Disposition: Return in about 6 months (around 02/07/2023).  Medication Adjustments/Labs and Tests Ordered: Current medicines are reviewed at length with the patient today.  Concerns regarding medicines are outlined above.  No orders of the defined types were placed in this encounter.  No orders of the defined types were placed in this encounter.   Patient Instructions  Medication Instructions:  The current medical regimen is effective;  continue present plan and medications.  *If you need a refill on your cardiac medications before your next appointment, please call your pharmacy*   Follow-Up: At [facility], you and your health needs are our priority.  As part of our continuing mission to provide you with exceptional heart care, we have created designated Provider Care Teams.  These Care Teams include your primary Cardiologist (physician) and Advanced Practice Providers (APPs -  Physician Assistants and Nurse Practitioners) who all work together  to provide you with the care you need, when you need it.  We recommend signing up for the patient portal called "MyChart".  Sign up information is provided on this After Visit Summary.  MyChart is used to connect with patients for Virtual Visits (Telemedicine).  Patients are able  to view lab/test results, encounter notes, upcoming appointments, etc.  Non-urgent messages can be sent to your provider as well.   To learn more about what you can do with MyChart, go to [email or web address].    Your next appointment:   6 month(s)  The format for your next appointment:   In Person  Provider:   Evalina Field, MD           Time Spent with Patient: I have spent a total of 25 minutes with patient reviewing hospital notes, telemetry, EKGs, labs and examining the patient as well as establishing an assessment and plan that was discussed with the patient.  > 50% of time was spent in direct patient care.  Signed, Addison Naegeli. Audie Box, MD, [place]  [address], [place], [place] [address] [phone]  08/08/2022 9:05 AM

## 2022-08-08 ENCOUNTER — Ambulatory Visit: Payer: Medicare Other | Attending: Cardiovascular Disease | Admitting: Cardiovascular Disease

## 2022-08-08 ENCOUNTER — Encounter: Payer: Self-pay | Admitting: Cardiovascular Disease

## 2022-08-08 VITALS — BP 116/72 | HR 66 | Ht 71.0 in | Wt 186.2 lb

## 2022-08-08 DIAGNOSIS — I25119 Atherosclerotic heart disease of native coronary artery with unspecified angina pectoris: Secondary | ICD-10-CM | POA: Diagnosis not present

## 2022-08-08 DIAGNOSIS — I480 Paroxysmal atrial fibrillation: Secondary | ICD-10-CM | POA: Diagnosis not present

## 2022-08-08 DIAGNOSIS — D6869 Other thrombophilia: Secondary | ICD-10-CM | POA: Diagnosis not present

## 2022-08-08 DIAGNOSIS — R001 Bradycardia, unspecified: Secondary | ICD-10-CM | POA: Diagnosis not present

## 2022-08-08 DIAGNOSIS — E7800 Pure hypercholesterolemia, unspecified: Secondary | ICD-10-CM | POA: Diagnosis not present

## 2022-08-08 DIAGNOSIS — I4819 Other persistent atrial fibrillation: Secondary | ICD-10-CM

## 2022-08-08 DIAGNOSIS — Z952 Presence of prosthetic heart valve: Secondary | ICD-10-CM | POA: Insufficient documentation

## 2022-08-08 LAB — BASIC METABOLIC PANEL
BUN/Creatinine Ratio: 19 (ref 10–24)
BUN: 19 mg/dL (ref 8–27)
CO2: 24 mmol/L (ref 20–29)
Calcium: 9.4 mg/dL (ref 8.6–10.2)
Chloride: 103 mmol/L (ref 96–106)
Creatinine, Ser: 1.02 mg/dL (ref 0.76–1.27)
Glucose: 89 mg/dL (ref 70–99)
Potassium: 4.3 mmol/L (ref 3.5–5.2)
Sodium: 139 mmol/L (ref 134–144)
eGFR: 73 mL/min/{1.73_m2} (ref >59)

## 2022-08-08 LAB — MAGNESIUM: Magnesium: 2.1 mg/dL (ref 1.6–2.3)

## 2022-08-08 NOTE — Patient Instructions (Signed)
Medication Instructions:  The current medical regimen is effective;  continue present plan and medications.  *If you need a refill on your cardiac medications before your next appointment, please call your pharmacy*   Follow-Up: At [facility], you and your health needs are our priority.  As part of our continuing mission to provide you with exceptional heart care, we have created designated Provider Care Teams.  These Care Teams include your primary Cardiologist (physician) and Advanced Practice Providers (APPs -  Physician Assistants and Nurse Practitioners) who all work together to provide you with the care you need, when you need it.  We recommend signing up for the patient portal called "MyChart".  Sign up information is provided on this After Visit Summary.  MyChart is used to connect with patients for Virtual Visits (Telemedicine).  Patients are able to view lab/test results, encounter notes, upcoming appointments, etc.  Non-urgent messages can be sent to your provider as well.   To learn more about what you can do with MyChart, go to [email or web address].    Your next appointment:   6 month(s)  The format for your next appointment:   In Person  Provider:   [place] T O'Neal, MD        

## 2022-08-09 DIAGNOSIS — J069 Acute upper respiratory infection, unspecified: Secondary | ICD-10-CM | POA: Diagnosis not present

## 2022-08-10 DIAGNOSIS — R52 Pain, unspecified: Secondary | ICD-10-CM | POA: Diagnosis not present

## 2022-08-10 DIAGNOSIS — U071 COVID-19: Secondary | ICD-10-CM | POA: Diagnosis not present

## 2022-08-10 DIAGNOSIS — R509 Fever, unspecified: Secondary | ICD-10-CM | POA: Diagnosis not present

## 2022-08-10 DIAGNOSIS — R051 Acute cough: Secondary | ICD-10-CM | POA: Diagnosis not present

## 2022-08-13 ENCOUNTER — Telehealth: Payer: Self-pay

## 2022-08-13 NOTE — Telephone Encounter (Signed)
-----   Message from Geralynn Rile, MD sent at 08/08/2022  5:13 PM EST ----- Regarding: RE: Aricept and Tikosyn Thanks.   Almyra Free: Can you call Mrs. Yonker and let her know that this will not be good to take with Tikosyn.   [place] T. Audie Box, MD, [place]  [address] [place], [place] [address] [phone]  5:13 PM  ----- Message ----- From: Melida Quitter, MD Sent: 08/08/2022  10:35 AM EST To: Geralynn Rile, MD Subject: RE: Aricept and Tikosyn                        I think that would be a bad idea. ----- Message ----- From: Geralynn Rile, MD Sent: 08/08/2022   8:13 AM EST To: Melida Quitter, MD Subject: Aricept and Tikosyn                            Gus:  Mr. Podgorski was recommended to start Aricept by his neurologist.  Is this okay with Tikosyn?  -Wes

## 2022-08-13 NOTE — Telephone Encounter (Signed)
Contacted Gerald Spears, spoke with wife (okay per DPR), advised of message from Dr.Mealor that it would not be a good idea for Aricept. They verbalized understanding.   Asked about lab work (results by Malka So, PA) gave these results, per patient request.   Thanks!

## 2022-08-14 LAB — ATN PROFILE
A -- Beta-amyloid 42/40 Ratio: 0.084 — ABNORMAL LOW (ref >0.102)
Beta-amyloid 40: 288.92 pg/mL
Beta-amyloid 42: 24.17 pg/mL
N -- NfL, Plasma: 4.25 pg/mL (ref 0.00–11.55)
T -- p-tau181: 1.29 pg/mL — ABNORMAL HIGH (ref 0.00–0.97)

## 2022-08-25 ENCOUNTER — Telehealth: Payer: Self-pay | Admitting: Neurology

## 2022-08-25 NOTE — Telephone Encounter (Signed)
Pt wife is calling. Requesting a call back from Dr. April Manson.

## 2022-09-01 ENCOUNTER — Ambulatory Visit: Payer: Medicare Other | Attending: Cardiovascular Disease | Admitting: Cardiovascular Disease

## 2022-09-01 ENCOUNTER — Encounter: Payer: Self-pay | Admitting: Cardiovascular Disease

## 2022-09-01 VITALS — BP 126/68 | HR 65 | Ht 71.0 in | Wt 185.0 lb

## 2022-09-01 DIAGNOSIS — I480 Paroxysmal atrial fibrillation: Secondary | ICD-10-CM | POA: Diagnosis not present

## 2022-09-01 NOTE — Progress Notes (Signed)
Cardiology Office Note:    Date:  09/01/2022   ID:  [name], DOB Dec 28, 1940, MRN [id number]  PCP:  [name], [place] Providers Cardiologist:  Evalina Field, MD Electrophysiologist:  Melida Quitter, MD     Referring MD: [name], MD   Chief complaint: AM dizziness with standing  History of Present Illness:    Gerald Spears is a 82 y.o. male with a hx of aortic stenosis s/p TAVR, atrial fibrillation which has been persistent in the past, referred by dr. Audie Box for management of bradycardia and atrial arrhythmia.  The patient complained of dizziness while taking metoprolol in the past. In clinic, his ECG showed sinus bradycardia at 50 bpm. The dizziness improved when he discontinued metoprolol.  A monitor was placed that showed an average heart rate of 65 bpm. He did have several episodes of AT and AF with uncontrolled rates.  His Apple Watch detected several episodes of A-fib after he discontinued metoprolol.  These episodes occurred while he was wearing the monitor correlated with monitor-detected atrial fib episodes.  His Apple Watch has not detected any A-fib since September 22.  He reports today that he feels much better than he did when taking metoprolol.  He does have some dizziness in the morning that occurs primarily after standing up and resolves after short period time.     Past Medical History:  Diagnosis Date   Cancer [facility])    colon cancer and melanoma   Complication of anesthesia    difficult to wake up after surgery   Coronary artery disease    DJD (degenerative joint disease)    Dr. Noemi Chapel end stages of DJD 2011   Hard of hearing    History of hiatal hernia    PAF (paroxysmal atrial fibrillation) (HCC)    S/P TAVR (transcatheter aortic valve replacement) 02/25/2022   S3UR 5m via TF apprach with Dr. TAli Loweand Dr. BCyndia Bent  Severe aortic stenosis    Urinary frequency    UTI (lower urinary tract infection)      Past Surgical History:  Procedure Laterality Date   ANKLE SURGERY Right    COLONOSCOPY W/ BIOPSIES AND POLYPECTOMY     ELBOW SURGERY Right    EYE SURGERY     HERNIA REPAIR     INTRAOPERATIVE TRANSTHORACIC ECHOCARDIOGRAM N/A 02/25/2022   Procedure: INTRAOPERATIVE TRANSTHORACIC ECHOCARDIOGRAM;  Surgeon: TEarly Osmond MD;  [facility];  Service: Open Heart Surgery;  Laterality: N/A;   JOINT REPLACEMENT     KNEE SURGERY Right    x4   LEFT HEART CATH AND CORONARY ANGIOGRAPHY N/A 09/12/2020   Procedure: LEFT HEART CATH AND CORONARY ANGIOGRAPHY;  Surgeon: CSherren Mocha MD;  [facility];  Service: Cardiovascular;  Laterality: N/A;   MULTIPLE TOOTH EXTRACTIONS     RIGHT/LEFT HEART CATH AND CORONARY ANGIOGRAPHY N/A 01/23/2022   Procedure: RIGHT/LEFT HEART CATH AND CORONARY ANGIOGRAPHY;  Surgeon: CSherren Mocha MD;  [facility];  Service: Cardiovascular;  Laterality: N/A;   TOTAL KNEE ARTHROPLASTY Right    TOTAL KNEE ARTHROPLASTY Left 06/18/2015   Procedure: TOTAL KNEE ARTHROPLASTY;  Surgeon: FFrederik Pear MD;  [facility]  Service: Orthopedics;  Laterality: Left;   TOTAL KNEE REVISION Right 02/11/2021   Procedure: RIGHT TOTAL KNEE REVISION;  Surgeon: RFrederik Pear MD;  [facility];  Service: Orthopedics;  Laterality: Right;   TRANSCATHETER AORTIC VALVE REPLACEMENT, TRANSFEMORAL N/A 02/25/2022  Procedure: Transcatheter Aortic Valve Replacement, Transfemoral;  Surgeon: Early Osmond, MD;  Location: [facility];  Service: Open Heart Surgery;  Laterality: N/A;    Current Medications: Current Meds  Medication Sig   acetaminophen (TYLENOL) 650 MG CR tablet Take 650 mg by mouth every 8 (eight) hours as needed for pain.   alfuzosin (UROXATRAL) 10 MG 24 hr tablet Take 1 tablet (10 mg total) by mouth at bedtime.   amoxicillin (AMOXIL) 500 MG capsule Take 2,000 mg by mouth See admin instructions. Take 2000 mg 1 hour prior to  dental work   atorvastatin (LIPITOR) 10 MG tablet TAKE MON-WEN- AND FRIDAYS (Patient taking differently: Take 10 mg by mouth daily.)   cholecalciferol (VITAMIN D3) 25 MCG (1000 UNIT) tablet Take 2,000 Units by mouth daily.   dofetilide (TIKOSYN) 500 MCG capsule Take 1 capsule (500 mcg total) by mouth 2 (two) times daily.   doxycycline (VIBRA-TABS) 100 MG tablet Take 100 mg by mouth daily. Continuous   ELIQUIS 5 MG TABS tablet TAKE 1 TABLET BY MOUTH TWICE A DAY   finasteride (PROSCAR) 5 MG tablet Take 1 tablet (5 mg total) by mouth daily at 12 noon.   valACYclovir (VALTREX) 500 MG tablet Take 500 mg by mouth 2 (two) times daily as needed (Cold sore). for 5 days at onset of cold sore   vitamin B-12 (CYANOCOBALAMIN) 1000 MCG tablet Take 1,000 mcg by mouth daily.     Allergies:   Patient has no known allergies.   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  Tobacco Use   Smoking status: Never   Smokeless tobacco: Never   Tobacco comments:    Never smoke 07/22/22  Vaping Use   Vaping Use: Never used  Substance and Sexual Activity   Alcohol use: No   Drug use: No   Sexual activity: Yes  Other Topics Concern   Not on file  Social History Narrative   Not on file   Social Determinants of Health   Financial Resource Strain: Not on file  Food Insecurity: Unknown (07/22/2022)   Hunger Vital Sign    Worried About Running Out of Food in the Last Year: Patient refused    [place] in the Last Year: Patient refused  Transportation Needs: No Transportation Needs (07/22/2022)   PRAPARE - Hydrologist (Medical): No    Lack of Transportation (Non-Medical): No  Physical Activity: Not on file  Stress: Not on file  Social Connections: Not on file      Family History: The patient's family history includes Breast cancer in his sister and sister; Heart attack in his mother; Heart disease in his father.  ROS:   Please see the history of present illness.    All other systems reviewed and are negative.  EKGs/Labs/Other Studies Reviewed:     14 day monitor: Patient had a min HR of 44 bpm (sinus bradycardia), max HR of 219 bpm (atrial fibrillation), and avg HR of 65 bpm (normal sinus rhythm). Predominant underlying rhythm was Sinus Rhythm. 46 Supraventricular Tachycardia runs occurred, the run with the fastest interval lasting 7 beats with a max rate of 207 bpm, the longest lasting 16 beats with an avg rate of  105 bpm. Atrial Fibrillation occurred (2% burden), ranging from 93-219 bpm (avg of 147 bpm), the longest lasting 6 hours 31 mins with an avg rate of 147 bpm. Supraventricular Tachycardia was detected within +/- 45 seconds of symptomatic patient event(s). Isolated SVEs were frequent (10.4%, [id number]), SVE Couplets were rare (<1.0%, 3388), and SVE Triplets were rare (<1.0%, 466). Isolated VEs were rare (<1.0%, 1484), VE Couplets were rare (<1.0%, 2), and VE Triplets were rare (<1.0%, 2). Triggered events correlated with sinus rhythm with PVCs   Impression: Paroxysmal Afib detected (2% burden, max HR 219). SVT detected (likely atrial tachycardia). Frequent PACs (10.4% burden).   TTE 03/26/22 EF 55%. Mild LVH. Left atrium is mildly dilated  EKG:  Last EKG results: Sinus rhythm with frequent PACs. HR 73 bpm   Recent Labs: 02/21/2022: ALT 19 02/26/2022: Hemoglobin 11.6; Platelets 89 08/08/2022: BUN 19; Creatinine, Ser 1.02; Magnesium 2.1; Potassium 4.3; Sodium 139    Risk Assessment/Calculations:    CHA2DS2-VASc Score = 3   This indicates a 3.2% annual risk of stroke. The patient's score is based upon: CHF History: 0 HTN History: 0 Diabetes History: 0 Stroke History: 0 Vascular Disease History: 1 Age Score: 2 Gender Score: 0          Physical Exam:    VS:  BP 126/68   Pulse 65   Ht '5\' 11"'$  (1.803 m)   Wt 185 lb (83.9 kg)   SpO2 99%   BMI 25.80 kg/m     Wt Readings from Last 3 Encounters:  09/01/22 185 lb (83.9 kg)  08/08/22 186 lb 3.2 oz (84.5 kg)  08/07/22 187 lb (84.8 kg)     GEN:  Well nourished, well developed in no acute distress CARDIAC: RRR, no murmurs, rubs, gallops RESPIRATORY:  Normal work of breathing MUSCULOSKELETAL: no edema    ASSESSMENT & PLAN:    Sinus bradycardia: minimum heart rate (44 bpm) occurred at about 3 AM. Average HR is 65 bpm. In clinic today, 73 bpm. He seems to be doing well off of metoprolol. Will monitor.  Paroxysmal atrial fibrillation and atrial tachycardia: S/p Tikosyn load and maintaining sinus rhythm. ECG today is good. Recent labs with normal creatinine. Follow-up 6 months. S/p TAVR: valve functioning normally          Medication Adjustments/Labs and Tests Ordered: Current medicines are reviewed at length with the patient today.  Concerns regarding medicines are outlined above.  No orders of the defined types were placed in this encounter.  No orders of the defined types were placed in this encounter.    Signed, Melida Quitter, MD  09/01/2022 12:24 PM    [place]

## 2022-09-01 NOTE — Patient Instructions (Signed)
Medication Instructions:  Your physician recommends that you continue on your current medications as directed. Please refer to the Current Medication list given to you today.  *If you need a refill on your cardiac medications before your next appointment, please call your pharmacy*  Lab Work: None ordered If you have labs (blood work) drawn today and your tests are completely normal, you will receive your results only by: [place] (if you have MyChart) OR A paper copy in the mail If you have any lab test that is abnormal or we need to change your treatment, we will call you to review the results.   Testing/Procedures: None ordered  Follow-Up: At [facility], you and your health needs are our priority.  As part of our continuing mission to provide you with exceptional heart care, we have created designated Provider Care Teams.  These Care Teams include your primary Cardiologist (physician) and Advanced Practice Providers (APPs -  Physician Assistants and Nurse Practitioners) who all work together to provide you with the care you need, when you need it.  Your next appointment:   6 month(s) call in March to schedule your appointment  The format for your next appointment:   In Person  Provider:   You may see Melida Quitter, MD or one of the following Advanced Practice Providers on your designated Care Team:   Tommye Standard, [place] Legrand Como "[place]" [place], PA-C [place], [place]

## 2022-09-19 ENCOUNTER — Encounter: Payer: Self-pay | Admitting: Cardiovascular Disease

## 2022-09-19 ENCOUNTER — Ambulatory Visit: Payer: Medicare Other

## 2022-09-19 ENCOUNTER — Ambulatory Visit: Payer: Medicare Other | Attending: Interventional Cardiology

## 2022-09-19 ENCOUNTER — Other Ambulatory Visit: Payer: Self-pay

## 2022-09-19 VITALS — HR 65 | Ht 71.0 in | Wt 185.0 lb

## 2022-09-19 DIAGNOSIS — I480 Paroxysmal atrial fibrillation: Secondary | ICD-10-CM

## 2022-09-19 DIAGNOSIS — Z79899 Other long term (current) drug therapy: Secondary | ICD-10-CM

## 2022-09-19 NOTE — Patient Instructions (Signed)
Medication Instructions:  Your physician recommends that you continue on your current medications as directed. Please refer to the Current Medication list given to you today.  *If you need a refill on your cardiac medications before your next appointment, please call your pharmacy*   Lab Work: None ordered.   If you have labs (blood work) drawn today and your tests are completely normal, you will receive your results only by: MyChart Message (if you have MyChart) OR A paper copy in the mail If you have any lab test that is abnormal or we need to change your treatment, we will call you to review the results.   Testing/Procedures: None ordered.    Follow-Up: At [facility], you and your health needs are our priority.  As part of our continuing mission to provide you with exceptional heart care, we have created designated Provider Care Teams.  These Care Teams include your primary Cardiologist (physician) and Advanced Practice Providers (APPs -  Physician Assistants and Nurse Practitioners) who all work together to provide you with the care you need, when you need it.  We recommend signing up for the patient portal called "MyChart".  Sign up information is provided on this After Visit Summary.  MyChart is used to connect with patients for Virtual Visits (Telemedicine).  Patients are able to view lab/test results, encounter notes, upcoming appointments, etc.  Non-urgent messages can be sent to your provider as well.   To learn more about what you can do with MyChart, go to [email or web address].    Your next appointment:   As scheduled 

## 2022-09-19 NOTE — Progress Notes (Signed)
   Nurse Visit   Date of Encounter: 09/19/2022 ID: SAIQUAN HANDS, DOB May 26, 1941, MRN [id number]  PCP:  [name], [place] Prov[facility] Cardiologist:  Evalina Field, MD Electrophysiologist:  Melida Quitter, MD      Visit Details   VS:  Pulse 65  , BMI There is no height or weight on file to calculate BMI.  Wt Readings from Last 3 Encounters:  09/01/22 185 lb (83.9 kg)  08/08/22 186 lb 3.2 oz (84.5 kg)  08/07/22 187 lb (84.8 kg)     Reason for visit: EKG due to taking Tikosyn '500mg'$  twice this morning Performed today: vitals, EKG and consulted with Tommye Standard, EP PA-C and Dr Irish Lack, DOD Changes (medications, testing, etc.) : no changes Length of Visit: 20 minutes    Medications Adjustments/Labs and Tests Ordered: Pt presents today for EKG due to taking his morning medications twice which included Tikosyn '500mg'$ .  Pt has been feeling well and out playing golf today.  EKG completed and reviewed.  Pt advised by R. Charlcie Cradle, PA-C that he may take medications as scheduled this evening.    Signed, Thora Lance, RN  09/19/2022 3:45 PM

## 2022-09-19 NOTE — Telephone Encounter (Signed)
Spoke with pt's wife, DPR and advised RN has spoken with Tommye Standard, PA-C and Megan,RPH re taking the extra dose of Tiskosyn and Eliquis.  Pt's wife advised per PA recommendation is for pt to have an EKG in the office today.  Pt's wife states pt is out on the golf course playing golf and she in not able to get in touch with him.  She reports pt was feeling "fine."  Pt's wife advised PA recommends pt not take his evening dose of medication and to start back on regular dosing schedule tomorrow.  Advised if pt develops CP, SOB, dizziness or fainting to not wait to be seen in the ED.  Pt's wife verbalizes understanding and thanked Therapist, sports for the call.

## 2022-10-07 DIAGNOSIS — M545 Low back pain, unspecified: Secondary | ICD-10-CM | POA: Diagnosis not present

## 2022-10-07 DIAGNOSIS — M25561 Pain in right knee: Secondary | ICD-10-CM | POA: Diagnosis not present

## 2022-10-10 DIAGNOSIS — L570 Actinic keratosis: Secondary | ICD-10-CM | POA: Diagnosis not present

## 2022-10-10 DIAGNOSIS — L814 Other melanin hyperpigmentation: Secondary | ICD-10-CM | POA: Diagnosis not present

## 2022-10-10 DIAGNOSIS — Z86006 Personal history of melanoma in-situ: Secondary | ICD-10-CM | POA: Diagnosis not present

## 2022-10-14 DIAGNOSIS — M47816 Spondylosis without myelopathy or radiculopathy, lumbar region: Secondary | ICD-10-CM | POA: Diagnosis not present

## 2022-10-21 DIAGNOSIS — M47816 Spondylosis without myelopathy or radiculopathy, lumbar region: Secondary | ICD-10-CM | POA: Diagnosis not present

## 2022-11-10 DIAGNOSIS — M47816 Spondylosis without myelopathy or radiculopathy, lumbar region: Secondary | ICD-10-CM | POA: Diagnosis not present

## 2022-12-03 ENCOUNTER — Ambulatory Visit (HOSPITAL_COMMUNITY): Payer: Medicare Other | Admitting: Physician Assistant

## 2022-12-08 ENCOUNTER — Ambulatory Visit (HOSPITAL_COMMUNITY)
Admission: RE | Admit: 2022-12-08 | Discharge: 2022-12-08 | Disposition: A | Discharge status: Home / Self Care | Payer: Medicare Other | Source: Ambulatory Visit | Attending: Physician Assistant | Admitting: Physician Assistant

## 2022-12-08 ENCOUNTER — Encounter (HOSPITAL_COMMUNITY): Payer: Self-pay | Admitting: Physician Assistant

## 2022-12-08 VITALS — BP 138/82 | HR 61 | Ht 71.0 in | Wt 187.8 lb

## 2022-12-08 DIAGNOSIS — D6869 Other thrombophilia: Secondary | ICD-10-CM

## 2022-12-08 DIAGNOSIS — I38 Endocarditis, valve unspecified: Secondary | ICD-10-CM | POA: Diagnosis not present

## 2022-12-08 DIAGNOSIS — Z7901 Long term (current) use of anticoagulants: Secondary | ICD-10-CM | POA: Insufficient documentation

## 2022-12-08 DIAGNOSIS — Z952 Presence of prosthetic heart valve: Secondary | ICD-10-CM | POA: Diagnosis not present

## 2022-12-08 DIAGNOSIS — I2510 Atherosclerotic heart disease of native coronary artery without angina pectoris: Secondary | ICD-10-CM | POA: Diagnosis not present

## 2022-12-08 DIAGNOSIS — Z5181 Encounter for therapeutic drug level monitoring: Secondary | ICD-10-CM

## 2022-12-08 DIAGNOSIS — I480 Paroxysmal atrial fibrillation: Secondary | ICD-10-CM | POA: Insufficient documentation

## 2022-12-08 DIAGNOSIS — Z79899 Other long term (current) drug therapy: Secondary | ICD-10-CM | POA: Diagnosis not present

## 2022-12-08 LAB — BASIC METABOLIC PANEL
Anion gap: 6 (ref 5–15)
BUN: 16 mg/dL (ref 8–23)
CO2: 24 mmol/L (ref 22–32)
Calcium: 9 mg/dL (ref 8.9–10.3)
Chloride: 106 mmol/L (ref 98–111)
Creatinine, Ser: 0.95 mg/dL (ref 0.61–1.24)
GFR, Estimated: >60 mL/min (ref >60)
Glucose, Bld: 99 mg/dL (ref 70–99)
Potassium: 4.7 mmol/L (ref 3.5–5.1)
Sodium: 136 mmol/L (ref 135–145)

## 2022-12-08 LAB — MAGNESIUM: Magnesium: 2 mg/dL (ref 1.7–2.4)

## 2022-12-08 NOTE — Progress Notes (Signed)
Primary Care Physician: Johny Blamer, MD Primary Cardiologist: Dr Flora Lipps Primary Electrophysiologist: Dr Nelly Laurence Referring Physician: Dr Jasmine Pang is a 82 y.o. male with a history of severe aortic stenosis status post TAVR, persistent atrial fibrillation, CAD, hyperlipidemia who presents for follow up in the [facility]. Patient is on Eliquis for a CHADS2VASC score of 3. He was previously on metoprolol but had symptomatic bradycardia and this was discontinued. However, his afib burden increased. He was seen by Dr Nelly Laurence who recommended dofetilide vs ablation, patient opted for dofetilide. S/p dofetilide admission 12/5-12/8/23.  On follow up today, patient reports that he has done well since his last visit. He remains in SR. No bleeding issues on anticoagulation.   Today, he denies symptoms of palpitations, chest pain, shortness of breath, orthopnea, PND, lower extremity edema, dizziness, presyncope, syncope, snoring, daytime somnolence, bleeding, or neurologic sequela. The patient is tolerating medications without difficulties and is otherwise without complaint today.    Atrial Fibrillation Risk Factors:  he does not have symptoms or diagnosis of sleep apnea. he does not have a history of rheumatic fever.   he has a BMI of Body mass index is 26.19 kg/m.Marland Kitchen Filed Weights   12/08/22 0917  Weight: 85.2 kg    Family History  Problem Relation Age of Onset   Heart disease Father    Heart attack Mother        age 3   Breast cancer Sister    Breast cancer Sister      Atrial Fibrillation Management history:  Previous antiarrhythmic drugs: dofetilide  Previous cardioversions: none Previous ablations: none CHADS2VASC score: 3 Anticoagulation history: Eliquis   Past Medical History:  Diagnosis Date   Cancer    colon cancer and melanoma   Complication of anesthesia    difficult to wake up after surgery   Coronary artery disease     DJD (degenerative joint disease)    Dr. Thurston Hole end stages of DJD 2011   Hard of hearing    History of hiatal hernia    PAF (paroxysmal atrial fibrillation)    S/P TAVR (transcatheter aortic valve replacement) 02/25/2022   S3UR 26mm via TF apprach with Dr. Lynnette Caffey and Dr. Laneta Simmers   Severe aortic stenosis    Urinary frequency    UTI (lower urinary tract infection)    Past Surgical History:  Procedure Laterality Date   ANKLE SURGERY Right    COLONOSCOPY W/ BIOPSIES AND POLYPECTOMY     ELBOW SURGERY Right    EYE SURGERY     HERNIA REPAIR     INTRAOPERATIVE TRANSTHORACIC ECHOCARDIOGRAM N/A 02/25/2022   Procedure: INTRAOPERATIVE TRANSTHORACIC ECHOCARDIOGRAM;  Surgeon: Orbie Pyo, MD;  Location: [facility] INVASIVE CV LAB;  Service: Open Heart Surgery;  Laterality: N/A;   JOINT REPLACEMENT     KNEE SURGERY Right    x4   LEFT HEART CATH AND CORONARY ANGIOGRAPHY N/A 09/12/2020   Procedure: LEFT HEART CATH AND CORONARY ANGIOGRAPHY;  Surgeon: Tonny Bollman, MD;  Location: [facility] INVASIVE CV LAB;  Service: Cardiovascular;  Laterality: N/A;   MULTIPLE TOOTH EXTRACTIONS     RIGHT/LEFT HEART CATH AND CORONARY ANGIOGRAPHY N/A 01/23/2022   Procedure: RIGHT/LEFT HEART CATH AND CORONARY ANGIOGRAPHY;  Surgeon: Tonny Bollman, MD;  Location: [facility] INVASIVE CV LAB;  Service: Cardiovascular;  Laterality: N/A;   TOTAL KNEE ARTHROPLASTY Right    TOTAL KNEE ARTHROPLASTY Left 06/18/2015   Procedure: TOTAL KNEE ARTHROPLASTY;  Surgeon: Gean Birchwood, MD;  Location: [facility] OR;  Service: Orthopedics;  Laterality: Left;   TOTAL KNEE REVISION Right 02/11/2021   Procedure: RIGHT TOTAL KNEE REVISION;  Surgeon: Gean Birchwood, MD;  Location: [facility] ORS;  Service: Orthopedics;  Laterality: Right;   TRANSCATHETER AORTIC VALVE REPLACEMENT, TRANSFEMORAL N/A 02/25/2022   Procedure: Transcatheter Aortic Valve Replacement, Transfemoral;  Surgeon: Orbie Pyo, MD;  Location: [facility] INVASIVE CV LAB;  Service: Open Heart Surgery;  Laterality:  N/A;    Current Outpatient Medications  Medication Sig Dispense Refill   acetaminophen (TYLENOL) 650 MG CR tablet Take 650 mg by mouth every 8 (eight) hours as needed for pain.     alfuzosin (UROXATRAL) 10 MG 24 hr tablet Take 1 tablet (10 mg total) by mouth at bedtime. 30 tablet 11   amoxicillin (AMOXIL) 500 MG capsule Take 2,000 mg by mouth See admin instructions. Take 2000 mg 1 hour prior to dental work     atorvastatin (LIPITOR) 10 MG tablet TAKE MON-WEN- AND FRIDAYS 90 tablet 3   cholecalciferol (VITAMIN D3) 25 MCG (1000 UNIT) tablet Take 2,000 Units by mouth daily.     dofetilide (TIKOSYN) 500 MCG capsule Take 1 capsule (500 mcg total) by mouth 2 (two) times daily. 180 capsule 1   doxycycline (VIBRA-TABS) 100 MG tablet Take 100 mg by mouth daily. Continuous     ELIQUIS 5 MG TABS tablet TAKE 1 TABLET BY MOUTH TWICE A DAY 180 tablet 1   finasteride (PROSCAR) 5 MG tablet Take 1 tablet (5 mg total) by mouth daily at 12 noon. 90 tablet 3   valACYclovir (VALTREX) 500 MG tablet Take 500 mg by mouth 2 (two) times daily as needed (Cold sore). for 5 days at onset of cold sore  2   vitamin B-12 (CYANOCOBALAMIN) 1000 MCG tablet Take 1,000 mcg by mouth daily.     No current facility-administered medications for this encounter.    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  Tobacco Use   Smoking status: Never   Smokeless tobacco: Never   Tobacco comments:    Never smoke 07/22/22  Vaping Use   Vaping Use: Never used  Substance and Sexual Activity   Alcohol use: No   Drug use: No   Sexual activity: Yes  Other Topics Concern   Not on file  Social History Narrative   Not on file   Social Determinants of Health   Financial Resource Strain: Not on file  Food  Insecurity: Patient Declined (07/22/2022)   Hunger Vital Sign    Worried About Running Out of Food in the Last Year: Patient declined    Ran Out of Food in the Last Year: Patient declined  Transportation Needs: No Transportation Needs (07/22/2022)   PRAPARE - Administrator, Civil Service (Medical): No    Lack of Transportation (Non-Medical): No  Physical Activity: Not on file  Stress: Not on file  Social Connections: Not on file  Intimate Partner Violence: Not At Risk (07/22/2022)   Humiliation, Afraid, Rape, and Kick questionnaire    Fear of Current or Ex-Partner: No    Emotionally Abused: No    Physically Abused: No    Sexually Abused: No     ROS- All systems are reviewed  and negative except as per the HPI above.  Physical Exam: Vitals:   12/08/22 0917  BP: 138/82  Pulse: 61  Weight: 85.2 kg  Height: 5\' 11"  (1.803 m)     GEN- The patient is a well appearing elderly male, alert and oriented x 3 today.   HEENT-head normocephalic, atraumatic, sclera clear, conjunctiva pink, hearing intact, trachea midline. Lungs- Clear to ausculation bilaterally, normal work of breathing Heart- Regular rate and rhythm, occasional ectopic beat, no murmurs, rubs or gallops  GI- soft, NT, ND, + BS Extremities- no clubbing, cyanosis, or edema MS- no significant deformity or atrophy Skin- no rash or lesion Psych- euthymic mood, full affect Neuro- strength and sensation are intact   Wt Readings from Last 3 Encounters:  12/08/22 85.2 kg  09/19/22 83.9 kg  09/01/22 83.9 kg    EKG today demonstrates  SR, PACs Vent. rate 61 BPM PR interval 130 ms QRS duration 100 ms QT/QTcB 458/461 ms  Echo 03/26/22 demonstrated   1. Left ventricular ejection fraction, by estimation, is 55%. The left  ventricle has normal function. The left ventricle has no regional wall  motion abnormalities. There is mild left ventricular hypertrophy. Left  ventricular diastolic parameters are consistent  with Grade II diastolic dysfunction (pseudonormalization).   2. Right ventricular systolic function is normal. The right ventricular  size is normal. There is normal pulmonary artery systolic pressure. The  estimated right ventricular systolic pressure is 34.4 mmHg.   3. Left atrial size was mildly dilated.   4. The mitral valve is normal in structure. Mild to moderate mitral valve  regurgitation. No evidence of mitral stenosis.   5. Bioprosthetic aortic valve s/p TAVR. There is a 26 mm Sapien THV.  Mild-moderate perivalvular regurgitation. Mean gradint 12 mmHg with DI  0.52, EOA 1.63 cm^2.   6. The inferior vena cava is normal in size with greater than 50%  respiratory variability, suggesting right atrial pressure of 3 mmHg.   Epic records are reviewed at length today  CHA2DS2-VASc Score = 3  The patient's score is based upon: CHF History: 0 HTN History: 0 Diabetes History: 0 Stroke History: 0 Vascular Disease History: 1 Age Score: 2 Gender Score: 0       ASSESSMENT AND PLAN: 1. Paroxysmal Atrial Fibrillation/atrial tach The patient's CHA2DS2-VASc score is 3, indicating a 3.2% annual risk of stroke.   S/p dofetilide admission 12/5-12/8/23. Patient appears to be maintaining SR.  Continue dofetilide 500 mcg BID. QT stable. Check bmet/mag today.  Continue Eliquis 5 mg BID Off AV nodal agents with h/o symptomatic bradycardia.   2. Secondary Hypercoagulable State (ICD10:  D68.69) The patient is at significant risk for stroke/thromboembolism based upon his CHA2DS2-VASc Score of 3.  Continue Apixaban (Eliquis).   3. Valvular heart disease Severe AS s/p TAVR Mild to moderate MR  4. CAD No anginal symptoms   Follow up with Dr Nelly Laurence or EP APP per recall. AF clinic in 6 months.    Jorja Loa PA-C [facility] [address] [place], [place] [address] [phone] 12/08/2022 9:25 AM

## 2023-01-07 ENCOUNTER — Other Ambulatory Visit (HOSPITAL_COMMUNITY): Payer: Self-pay | Admitting: Physician Assistant

## 2023-01-11 ENCOUNTER — Other Ambulatory Visit (HOSPITAL_BASED_OUTPATIENT_CLINIC_OR_DEPARTMENT_OTHER): Payer: Self-pay | Admitting: Cardiovascular Disease

## 2023-01-13 NOTE — Telephone Encounter (Signed)
Pt last saw Clint Fenton, PA on 12/08/22, last labs 12/08/22 Creat 0.95, age 82, weight 85.2kg, based on specified criteria pt is on appropriate dosage of Eliquis 5mg  BID for afib.  Will refill rx.

## 2023-01-26 ENCOUNTER — Encounter: Payer: Self-pay | Admitting: Cardiovascular Disease

## 2023-02-05 NOTE — Progress Notes (Signed)
Cardiology Office Note:   Date:  02/09/2023  NAME:  Gerald Spears    MRN: [id number] DOB:  [birth year]   PCP:  Noberto Retort, MD  Cardiologist:  Reatha Harps, MD  Electrophysiologist:  Maurice Small, MD   Referring MD: Noberto Retort, MD   Chief Complaint  Patient presents with   Follow-up         History of Present Illness:   Gerald Spears is a 82 y.o. male with a hx of persistent Afib, CAD, severe AS s/p TAVR, HLD who presents for follow-up.  He presents with his wife.  Blood pressure is 88/64.  Reports dizziness with standing.  Seems to be stable.  Reports no fevers or chills.  No cough or congestion.  Heart rate in the 80s.  Denies any recurrence of atrial fibrillation.  He is not drinking enough water.  His wife reports he may drink 1 to 2 glasses/day.  We discussed this is not enough.  He is not on any blood pressure medications.  No bruising or bleeding on Eliquis.  We discussed that he has to drink more water.  He has to stay active.  He also suffers from mild cognitive impairment.  This is being followed by neurology.  CV exam unremarkable.  He does have an echo next month.  Problem List 1. Severe aortic stenosis -26 mm S3 TAVR 02/25/2022 2. Mitral regurgitation, moderate -09/27/2018 3. CAD -CAC score 682 (62nd percentile) -Apical ischemia 08/29/2020 -LHC 09/12/2020: D2 80% 4. HLD -T chol 180, LDL 106, HDL 61, TG 71 5. PACs -5.8% burden -brief ectopic atrial tachycardia  6. Atrial fibrillation, persistent 02/19/2021 -converted to NSR in ER -CHADSVASC= 3 (age, CAD) -On Tikosyn 7.  Mild cognitive impairment  Past Medical History: Past Medical History:  Diagnosis Date   Cancer (HCC)    colon cancer and melanoma   Complication of anesthesia    difficult to wake up after surgery   Coronary artery disease    DJD (degenerative joint disease)    Dr. Thurston Hole end stages of DJD 2011   Hard of hearing    History of hiatal hernia    PAF (paroxysmal  atrial fibrillation) (HCC)    S/P TAVR (transcatheter aortic valve replacement) 02/25/2022   S3UR 26mm via TF apprach with Dr. Lynnette Caffey and Dr. Laneta Simmers   Severe aortic stenosis    Urinary frequency    UTI (lower urinary tract infection)     Past Surgical History: Past Surgical History:  Procedure Laterality Date   ANKLE SURGERY Right    COLONOSCOPY W/ BIOPSIES AND POLYPECTOMY     ELBOW SURGERY Right    EYE SURGERY     HERNIA REPAIR     INTRAOPERATIVE TRANSTHORACIC ECHOCARDIOGRAM N/A 02/25/2022   Procedure: INTRAOPERATIVE TRANSTHORACIC ECHOCARDIOGRAM;  Surgeon: Orbie Pyo, MD;  Location: [facility] INVASIVE CV LAB;  Service: Open Heart Surgery;  Laterality: N/A;   JOINT REPLACEMENT     KNEE SURGERY Right    x4   LEFT HEART CATH AND CORONARY ANGIOGRAPHY N/A 09/12/2020   Procedure: LEFT HEART CATH AND CORONARY ANGIOGRAPHY;  Surgeon: Tonny Bollman, MD;  Location: [facility] INVASIVE CV LAB;  Service: Cardiovascular;  Laterality: N/A;   MULTIPLE TOOTH EXTRACTIONS     RIGHT/LEFT HEART CATH AND CORONARY ANGIOGRAPHY N/A 01/23/2022   Procedure: RIGHT/LEFT HEART CATH AND CORONARY ANGIOGRAPHY;  Surgeon: Tonny Bollman, MD;  Location: [facility] INVASIVE CV LAB;  Service: Cardiovascular;  Laterality: N/A;   TOTAL KNEE ARTHROPLASTY  Right    TOTAL KNEE ARTHROPLASTY Left 06/18/2015   Procedure: TOTAL KNEE ARTHROPLASTY;  Surgeon: Gean Birchwood, MD;  Location: [facility] OR;  Service: Orthopedics;  Laterality: Left;   TOTAL KNEE REVISION Right 02/11/2021   Procedure: RIGHT TOTAL KNEE REVISION;  Surgeon: Gean Birchwood, MD;  Location: [facility] ORS;  Service: Orthopedics;  Laterality: Right;   TRANSCATHETER AORTIC VALVE REPLACEMENT, TRANSFEMORAL N/A 02/25/2022   Procedure: Transcatheter Aortic Valve Replacement, Transfemoral;  Surgeon: Orbie Pyo, MD;  Location: [facility] INVASIVE CV LAB;  Service: Open Heart Surgery;  Laterality: N/A;    Current Medications: Current Meds  Medication Sig   acetaminophen (TYLENOL) 650 MG CR tablet Take  650 mg by mouth every 8 (eight) hours as needed for pain.   alfuzosin (UROXATRAL) 10 MG 24 hr tablet Take 1 tablet (10 mg total) by mouth at bedtime.   amoxicillin (AMOXIL) 500 MG capsule Take 2,000 mg by mouth See admin instructions. Take 2000 mg 1 hour prior to dental work   atorvastatin (LIPITOR) 10 MG tablet TAKE MON-WEN- AND FRIDAYS   cholecalciferol (VITAMIN D3) 25 MCG (1000 UNIT) tablet Take 2,000 Units by mouth daily.   dofetilide (TIKOSYN) 500 MCG capsule TAKE ONE CAPSULE BY MOUTH TWICE DAILY   ELIQUIS 5 MG TABS tablet TAKE 1 TABLET BY MOUTH TWICE A DAY   finasteride (PROSCAR) 5 MG tablet Take 1 tablet (5 mg total) by mouth daily at 12 noon.   Turmeric (CURCUMIN 95 PO) Take by mouth.   valACYclovir (VALTREX) 500 MG tablet Take 500 mg by mouth 2 (two) times daily as needed (Cold sore). for 5 days at onset of cold sore   vitamin B-12 (CYANOCOBALAMIN) 1000 MCG tablet Take 1,000 mcg by mouth daily.     Allergies:    Patient has no known allergies.   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  Tobacco Use   Smoking status: Never   Smokeless tobacco: Never   Tobacco comments:    Never smoke 07/22/22  Vaping Use   Vaping Use: Never used  Substance and Sexual Activity   Alcohol use: No   Drug use: No   Sexual activity: Yes  Other Topics Concern   Not on file  Social History Narrative   Not on file   Social Determinants of Health   Financial Resource Strain: Not on file  Food Insecurity: Patient Declined (07/22/2022)   Hunger Vital Sign    Worried About Running Out of Food in the Last Year: Patient declined    Ran Out of Food in the Last Year: Patient declined  Transportation Needs: No Transportation Needs (07/22/2022)   PRAPARE - Doctor, general practice (Medical): No    Lack of Transportation (Non-Medical): No  Physical Activity: Not on file  Stress: Not on file  Social Connections: Not on file     Family History: The patient's family history includes Breast cancer in his sister and sister; Heart attack in his mother; Heart disease in his father.  ROS:   All other ROS reviewed and negative. Pertinent positives noted in the HPI.     EKGs/Labs/Other Studies Reviewed:   The following studies were personally reviewed by me today:  EKG:  EKG is not  ordered today.        Recent Labs: 02/21/2022: ALT 19 02/26/2022: Hemoglobin 11.6; Platelets 89 12/08/2022: BUN 16; Creatinine, Ser 0.95; Magnesium 2.0; Potassium 4.7; Sodium 136   Recent Lipid Panel No results found for: "CHOL", "TRIG", "HDL", "CHOLHDL", "VLDL", "LDLCALC", "LDLDIRECT"  Physical Exam:   VS:  BP (!) 88/64   Pulse 88   Ht 6' (1.829 m)   Wt 182 lb 6.4 oz (82.7 kg)   SpO2 98%   BMI 24.74 kg/m    Wt Readings from Last 3 Encounters:  02/09/23 182 lb 6.4 oz (82.7 kg)  12/08/22 187 lb 12.8 oz (85.2 kg)  09/19/22 185 lb (83.9 kg)    General: Well nourished, well developed, in no acute distress Head: Atraumatic, normal size  Eyes: PEERLA, EOMI  Neck: Supple, no JVD Endocrine: No thryomegaly Cardiac: Normal S1, S2; RRR; no murmurs, rubs, or gallops Lungs: Clear to auscultation bilaterally, no wheezing, rhonchi or rales  Abd: Soft, nontender, no hepatomegaly  Ext: No edema, pulses 2+ Musculoskeletal: No deformities, BUE and BLE strength normal and equal Skin: Warm and dry, no rashes   Neuro: Alert and oriented to person, place, time, and situation, CNII-XII grossly intact, no focal deficits  Psych: Normal mood and affect   ASSESSMENT:   ROJELIO UHRICH is a 82 y.o. male who presents for the following: 1. Dehydration   2. Hypotension due to hypovolemia   3. PAF (paroxysmal atrial fibrillation) (HCC)   4. Acquired thrombophilia (HCC)   5.  Bradycardia   6. S/P TAVR (transcatheter aortic valve replacement)   7. Coronary artery disease involving native coronary artery of native heart with angina pectoris (HCC)   8. Hypercholesterolemia     PLAN:   1. Dehydration 2. Hypotension due to hypovolemia -Blood pressure is quite low likely due to dehydration.  He is not on any blood pressure medications.  He denies any infectious symptoms today.  His CV exam is normal.  Highly suspect this is related to dehydration.  I recommend he drink 40 to 60 ounces of water per day.  His wife will work on this.  He has a repeat echo next month.  3. PAF (paroxysmal atrial fibrillation) (HCC) 4. Acquired thrombophilia (HCC) -Well-controlled.  Continue Tikosyn.  On Eliquis.  5. Bradycardia -No recurrence.  Off AV nodal agents.  6. S/P TAVR (transcatheter aortic valve replacement) -Status post TAVR.  No significant murmur.  Follow-up echo next month.  7. Coronary artery disease involving native coronary artery of native heart with angina pectoris (HCC) 8. Hypercholesterolemia -Single-vessel obstructive CAD that is managed medically.  No symptoms of angina.  Not on aspirin as he is on Eliquis.  On Lipitor.  Disposition: Return in about 3 months (around 05/12/2023).  Medication Adjustments/Labs and Tests Ordered: Current medicines are reviewed at length with the patient today.  Concerns regarding medicines are outlined above.  No orders of the defined types were placed in this encounter.  No orders of the defined types were placed in this encounter.  Patient Instructions  Medication Instructions:  Your physician recommends that you continue on your current medications as directed. Please refer to the Current Medication list given to you today.  *If you need a refill on your cardiac medications before your next appointment, please call your pharmacy*  Lab Work: NONE If you have labs (blood work) drawn today and your tests are completely  normal, you will receive your results only by: MyChart Message (if you have MyChart) OR  A paper copy in the mail If you have any lab test that is abnormal or we need to change your treatment, we will call you to review the results.  Testing/Procedures: NONE  Follow-Up: At [facility], you and your health needs are our priority.  As part of our continuing mission to provide you with exceptional heart care, we have created designated Provider Care Teams.  These Care Teams include your primary Cardiologist (physician) and Advanced Practice Providers (APPs -  Physician Assistants and Nurse Practitioners) who all work together to provide you with the care you need, when you need it.  We recommend signing up for the patient portal called "MyChart".  Sign up information is provided on this After Visit Summary.  MyChart is used to connect with patients for Virtual Visits (Telemedicine).  Patients are able to view lab/test results, encounter notes, upcoming appointments, etc.  Non-urgent messages can be sent to your provider as well.   To learn more about what you can do with MyChart, go to [email or web address].    Your next appointment:   3 month(s)  Provider:   Reatha Harps, MD       Other Instructions **PLEASE increase water intake-goal of 40-60oz per day**   Time Spent with Patient: I have spent a total of 35 minutes with patient reviewing hospital notes, telemetry, EKGs, labs and examining the patient as well as establishing an assessment and plan that was discussed with the patient.  > 50% of time was spent in direct patient care.  Signed, Lenna Gilford. Flora Lipps, MD, [facility]  [facility]  [address] [place], [place] [address] [phone]  02/09/2023 9:48 AM

## 2023-02-09 ENCOUNTER — Encounter: Payer: Self-pay | Admitting: Cardiovascular Disease

## 2023-02-09 ENCOUNTER — Ambulatory Visit: Payer: Medicare Other | Attending: Cardiovascular Disease | Admitting: Cardiovascular Disease

## 2023-02-09 VITALS — BP 88/64 | HR 88 | Ht 72.0 in | Wt 182.4 lb

## 2023-02-09 DIAGNOSIS — R001 Bradycardia, unspecified: Secondary | ICD-10-CM

## 2023-02-09 DIAGNOSIS — D6869 Other thrombophilia: Secondary | ICD-10-CM | POA: Diagnosis not present

## 2023-02-09 DIAGNOSIS — E860 Dehydration: Secondary | ICD-10-CM

## 2023-02-09 DIAGNOSIS — Z952 Presence of prosthetic heart valve: Secondary | ICD-10-CM | POA: Diagnosis not present

## 2023-02-09 DIAGNOSIS — E861 Hypovolemia: Secondary | ICD-10-CM | POA: Diagnosis not present

## 2023-02-09 DIAGNOSIS — E7800 Pure hypercholesterolemia, unspecified: Secondary | ICD-10-CM

## 2023-02-09 DIAGNOSIS — I480 Paroxysmal atrial fibrillation: Secondary | ICD-10-CM

## 2023-02-09 DIAGNOSIS — I25119 Atherosclerotic heart disease of native coronary artery with unspecified angina pectoris: Secondary | ICD-10-CM

## 2023-02-09 NOTE — Patient Instructions (Signed)
Medication Instructions:  Your physician recommends that you continue on your current medications as directed. Please refer to the Current Medication list given to you today.  *If you need a refill on your cardiac medications before your next appointment, please call your pharmacy*  Lab Work: NONE If you have labs (blood work) drawn today and your tests are completely normal, you will receive your results only by: MyChart Message (if you have MyChart) OR A paper copy in the mail If you have any lab test that is abnormal or we need to change your treatment, we will call you to review the results.  Testing/Procedures: NONE  Follow-Up: At [facility], you and your health needs are our priority.  As part of our continuing mission to provide you with exceptional heart care, we have created designated Provider Care Teams.  These Care Teams include your primary Cardiologist (physician) and Advanced Practice Providers (APPs -  Physician Assistants and Nurse Practitioners) who all work together to provide you with the care you need, when you need it.  We recommend signing up for the patient portal called "MyChart".  Sign up information is provided on this After Visit Summary.  MyChart is used to connect with patients for Virtual Visits (Telemedicine).  Patients are able to view lab/test results, encounter notes, upcoming appointments, etc.  Non-urgent messages can be sent to your provider as well.   To learn more about what you can do with MyChart, go to [email or web address].    Your next appointment:   3 month(s)  Provider:   Reatha Harps, MD       Other Instructions **PLEASE increase water intake-goal of 40-60oz per day**

## 2023-02-23 ENCOUNTER — Ambulatory Visit (INDEPENDENT_AMBULATORY_CARE_PROVIDER_SITE_OTHER): Payer: Medicare Other | Admitting: Cardiology

## 2023-02-23 ENCOUNTER — Other Ambulatory Visit: Payer: Self-pay

## 2023-02-23 ENCOUNTER — Ambulatory Visit (HOSPITAL_COMMUNITY): Payer: Medicare Other | Attending: Cardiovascular Disease

## 2023-02-23 VITALS — BP 122/62 | HR 47 | Ht 72.0 in | Wt 180.0 lb

## 2023-02-23 DIAGNOSIS — I25119 Atherosclerotic heart disease of native coronary artery with unspecified angina pectoris: Secondary | ICD-10-CM

## 2023-02-23 DIAGNOSIS — Z952 Presence of prosthetic heart valve: Secondary | ICD-10-CM

## 2023-02-23 DIAGNOSIS — Z7901 Long term (current) use of anticoagulants: Secondary | ICD-10-CM | POA: Diagnosis not present

## 2023-02-23 DIAGNOSIS — I350 Nonrheumatic aortic (valve) stenosis: Secondary | ICD-10-CM | POA: Diagnosis not present

## 2023-02-23 DIAGNOSIS — I10 Essential (primary) hypertension: Secondary | ICD-10-CM | POA: Diagnosis not present

## 2023-02-23 DIAGNOSIS — I480 Paroxysmal atrial fibrillation: Secondary | ICD-10-CM | POA: Insufficient documentation

## 2023-02-23 DIAGNOSIS — E861 Hypovolemia: Secondary | ICD-10-CM

## 2023-02-23 LAB — ECHOCARDIOGRAM COMPLETE
AR max vel: 1.2 cm2
AV Area VTI: 1.11 cm2
AV Area mean vel: 1.1 cm2
AV Mean grad: 13 mmHg
AV Peak grad: 21.5 mmHg
Ao pk vel: 2.32 m/s
Area-P 1/2: 3.26 cm2
MV M vel: 4.1 m/s
MV Peak grad: 67.2 mmHg
P 1/2 time: 397 msec
Radius: 0.8 cm
S' Lateral: 4.1 cm

## 2023-02-23 NOTE — Patient Instructions (Signed)
Medication Instructions:  Your physician recommends that you continue on your current medications as directed. Please refer to the Current Medication list given to you today.  *If you need a refill on your cardiac medications before your next appointment, please call your pharmacy*   Lab Work: NONE If you have labs (blood work) drawn today and your tests are completely normal, you will receive your results only by: MyChart Message (if you have MyChart) OR A paper copy in the mail If you have any lab test that is abnormal or we need to change your treatment, we will call you to review the results.   Testing/Procedures: NONE   Follow-Up: At [facility], you and your health needs are our priority.  As part of our continuing mission to provide you with exceptional heart care, we have created designated Provider Care Teams.  These Care Teams include your primary Cardiologist (physician) and Advanced Practice Providers (APPs -  Physician Assistants and Nurse Practitioners) who all work together to provide you with the care you need, when you need it.  We recommend signing up for the patient portal called "MyChart".  Sign up information is provided on this After Visit Summary.  MyChart is used to connect with patients for Virtual Visits (Telemedicine).  Patients are able to view lab/test results, encounter notes, upcoming appointments, etc.  Non-urgent messages can be sent to your provider as well.   To learn more about what you can do with MyChart, go to [email or web address].    Your next appointment:   KEEP SCHEDULED FOLLOW-UP 

## 2023-02-24 NOTE — Progress Notes (Signed)
[facility]   MULTIDISCIPLINARY HEART VALVE CLINIC                                     Cardiology Office Note:    Date:  02/24/2023   ID:  Gerald Spears, DOB 12-05-1940, MRN [id number]  PCP:  Noberto Retort, MD  [facility] Cardiologist:  Reatha Harps, MD /Dr. Lynnette Caffey, MD and Dr. Laneta Simmers, MD (TAVR)  [facility] Electrophysiologist:  Maurice Small, MD   Referring MD: Noberto Retort, MD   Chief Complaint  Patient presents with   Follow-up    1 year s/p TAVR   History of Present Illness:    Gerald Spears is a 82 y.o. male with a hx of paroxysmal atrial fibrillation on Eliquis, hypertension, CAD, and severe aortic stenosis s/p TAVR (02/25/22) who presents to clinic for one year follow up.   Mr. Sivers underwent a cardiac catheterization in 08/2020 after presenting with exertional dyspnea and having an abnormal nuclear stress test with a reversible perfusion defect in the lateral apex. Cardiac cath showed severe diagonal branch stenosis but no other obstructive epicardial CAD with recommendations for continued medical therapy. He then began having fairly rapid progression of aortic stenosis. In 2020 the mean transaortic gradient was 16 mmHg with a dimensionless index of 0.34. By 2022 this had increased to 32 mmHg with a dimensionless index of 0.35.  Most recent echocardiogram with a  peak and mean gradients of 62 and 40 mmHg, respectively, with a dimensionless index of 0.17 and calculated valve area of 0.7 cm. R/LHC 01/23/22 with severe diagonal stenosis at 60% with otherwise non-obstructive disease. Severe calcific aortic stenosis with mean transvalvular gradient of 51 mmHg, calculated aortic valve area of 0.44 cm. Normal right heart pressures with preserved cardiac output.    He was seen by the multidisciplinary valve team and underwent successful TAVR with a 26 mm Edwards Sapien 3 UR via the TF approach on 02/25/22.  More recently he has been having  issues with symptomatic AF. He was seen by EP and was ultimately admitted and started on Tikosyn. He last saw Dr. Flora Lipps 02/09/23 and was noted to be hypotensive felt to be secondary to hypovolemia.   Today he is here with his wife and reports that he is much better with no further dizziness noted. He has been increasing his plain water intake since he was last seen. Walking daily for exercise with no issues. Tolerating medications. He denies chest pain, SOB, palpitations, LE edema, orthopnea, PND, dizziness, or syncope. Denies bleeding in stool or urine.    Past Medical History:  Diagnosis Date   Cancer [facility])    colon cancer and melanoma   Complication of anesthesia    difficult to wake up after surgery   Coronary artery disease    DJD (degenerative joint disease)    Dr. Thurston Hole end stages of DJD 2011   Hard of hearing    History of hiatal hernia    PAF (paroxysmal atrial fibrillation) (HCC)    S/P TAVR (transcatheter aortic valve replacement) 02/25/2022   S3UR 26mm via TF apprach with Dr. Lynnette Caffey and Dr. Laneta Simmers   Severe aortic stenosis    Urinary frequency    UTI (lower urinary tract infection)     Past Surgical History:  Procedure Laterality Date   ANKLE SURGERY Right    COLONOSCOPY W/ BIOPSIES AND  POLYPECTOMY     ELBOW SURGERY Right    EYE SURGERY     HERNIA REPAIR     INTRAOPERATIVE TRANSTHORACIC ECHOCARDIOGRAM N/A 02/25/2022   Procedure: INTRAOPERATIVE TRANSTHORACIC ECHOCARDIOGRAM;  Surgeon: Orbie Pyo, MD;  Location: [facility] INVASIVE CV LAB;  Service: Open Heart Surgery;  Laterality: N/A;   JOINT REPLACEMENT     KNEE SURGERY Right    x4   LEFT HEART CATH AND CORONARY ANGIOGRAPHY N/A 09/12/2020   Procedure: LEFT HEART CATH AND CORONARY ANGIOGRAPHY;  Surgeon: Tonny Bollman, MD;  Location: [facility] INVASIVE CV LAB;  Service: Cardiovascular;  Laterality: N/A;   MULTIPLE TOOTH EXTRACTIONS     RIGHT/LEFT HEART CATH AND CORONARY ANGIOGRAPHY N/A 01/23/2022   Procedure: RIGHT/LEFT HEART  CATH AND CORONARY ANGIOGRAPHY;  Surgeon: Tonny Bollman, MD;  Location: [facility] INVASIVE CV LAB;  Service: Cardiovascular;  Laterality: N/A;   TOTAL KNEE ARTHROPLASTY Right    TOTAL KNEE ARTHROPLASTY Left 06/18/2015   Procedure: TOTAL KNEE ARTHROPLASTY;  Surgeon: Gean Birchwood, MD;  Location: [facility] OR;  Service: Orthopedics;  Laterality: Left;   TOTAL KNEE REVISION Right 02/11/2021   Procedure: RIGHT TOTAL KNEE REVISION;  Surgeon: Gean Birchwood, MD;  Location: [facility] ORS;  Service: Orthopedics;  Laterality: Right;   TRANSCATHETER AORTIC VALVE REPLACEMENT, TRANSFEMORAL N/A 02/25/2022   Procedure: Transcatheter Aortic Valve Replacement, Transfemoral;  Surgeon: Orbie Pyo, MD;  Location: [facility] INVASIVE CV LAB;  Service: Open Heart Surgery;  Laterality: N/A;    Current Medications: Current Meds  Medication Sig   acetaminophen (TYLENOL) 650 MG CR tablet Take 650 mg by mouth every 8 (eight) hours as needed for pain.   alfuzosin (UROXATRAL) 10 MG 24 hr tablet Take 1 tablet (10 mg total) by mouth at bedtime.   amoxicillin (AMOXIL) 500 MG capsule Take 2,000 mg by mouth See admin instructions. Take 2000 mg 1 hour prior to dental work   atorvastatin (LIPITOR) 10 MG tablet TAKE MON-WEN- AND FRIDAYS   cholecalciferol (VITAMIN D3) 25 MCG (1000 UNIT) tablet Take 2,000 Units by mouth daily.   dofetilide (TIKOSYN) 500 MCG capsule TAKE ONE CAPSULE BY MOUTH TWICE DAILY   ELIQUIS 5 MG TABS tablet TAKE 1 TABLET BY MOUTH TWICE A DAY   finasteride (PROSCAR) 5 MG tablet Take 1 tablet (5 mg total) by mouth daily at 12 noon.   Turmeric (CURCUMIN 95 PO) Take by mouth.   valACYclovir (VALTREX) 500 MG tablet Take 500 mg by mouth 2 (two) times daily as needed (Cold sore). for 5 days at onset of cold sore   vitamin B-12 (CYANOCOBALAMIN) 1000 MCG tablet Take 1,000 mcg by mouth daily.     Allergies:   Patient has no known allergies.   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  Tobacco Use   Smoking status: Never   Smokeless tobacco: Never   Tobacco comments:    Never smoke 07/22/22  Vaping Use   Vaping Use: Never used  Substance and Sexual Activity   Alcohol use: No   Drug use: No   Sexual activity: Yes  Other Topics Concern   Not on file  Social History Narrative   Not on file   Social Determinants of Health   Financial Resource Strain: Not on file  Food  Insecurity: Patient Declined (07/22/2022)   Hunger Vital Sign    Worried About Running Out of Food in the Last Year: Patient declined    Ran Out of Food in the Last Year: Patient declined  Transportation Needs: No Transportation Needs (07/22/2022)   PRAPARE - Administrator, Civil Service (Medical): No    Lack of Transportation (Non-Medical): No  Physical Activity: Not on file  Stress: Not on file  Social Connections: Not on file    Family History: The patient's family history includes Breast cancer in his sister and sister; Heart attack in his mother; Heart disease in his father.  ROS:   Please see the history of present illness.    All other systems reviewed and are negative.  EKGs/Labs/Other Studies Reviewed:    The following studies were reviewed today:  Cardiac Studies & Procedures   CARDIAC CATHETERIZATION  CARDIAC CATHETERIZATION 01/23/2022  Narrative 1.  Patent coronary arteries with no significant change in coronary anatomy from previous study with no significant stenosis in the left main, LAD, left circumflex, or nondominant RCA.  There is severe stenosis in a small to medium caliber second diagonal branch that is unchanged from the previous study. 2.  Severe calcific aortic stenosis with mean transvalvular gradient of 51 mmHg, calculated aortic valve area of 0.44 cm 3.  Normal right heart pressures with preserved  cardiac output.  Plan: continue evaluation for TAVR. Aortic stenosis has progressed significantly since prior evaluation.  Findings Coronary Findings Diagnostic  Dominance: Left  Left Main Vessel is normal in caliber. Vessel is angiographically normal. Divides into LAD and LCx  Left Anterior Descending The vessel exhibits minimal luminal irregularities. The LAD is patent to the apex with mild plaquing but no significant stenosis  First Diagonal Branch The vessel exhibits minimal luminal irregularities.  Second Diagonal Branch Medium caliber vessel, unchanged from prior study 2nd Diag-1 lesion is 60% stenosed. 2nd Diag-2 lesion is 85% stenosed. The lesion is calcified. focal stenosis, moderate caliber branch vessel  Left Circumflex Vessel is large. There is mild diffuse disease throughout the vessel. The circumflex is dominant.  The vessel has mild plaquing without any high-grade stenosis present.  The obtuse marginal is patent with mild stenosis.  First Obtuse Marginal Branch There is mild disease in the vessel.  Second Obtuse Marginal Branch There is mild disease in the vessel. 2nd Mrg lesion is 50% stenosed.  Right Coronary Artery Vessel is moderate in size. The vessel exhibits minimal luminal irregularities. Nondominant RCA without significant stenosis  Intervention  No interventions have been documented.   CARDIAC CATHETERIZATION  CARDIAC CATHETERIZATION 09/12/2020  Narrative 1. Left dominant coronary anatomy 2. Patent left main 3. Patent LAD with no significant stenosis, but severe second diagonal branch stenosis 4. Moderate OM stenosis, no tight stenosis in LCx distribution 5. Patent, nondominant RCA 6. Normal LVEDP  Recommend: medical therapy, branch vessel CAD in patient without angina. Low risk nuclear stress test but nuclear defect fits coronary stenosis in second diagonal. PCI technically feasible if patient fails med Rx.  Findings Coronary  Findings Diagnostic  Dominance: Left  Left Main Vessel was injected. Vessel is normal in caliber. Vessel is angiographically normal. Divides into LAD and LCx  Left Anterior Descending The vessel exhibits minimal luminal irregularities.  First Diagonal Branch The vessel exhibits minimal luminal irregularities.  Second Diagonal Branch 2nd Diag-1 lesion is 60% stenosed. 2nd Diag-2 lesion is 85% stenosed. The lesion is calcified. focal stenosis, moderate caliber branch vessel  Left  Circumflex Vessel is large. There is mild diffuse disease throughout the vessel.  First Obtuse Marginal Branch There is mild disease in the vessel.  Second Obtuse Marginal Branch There is mild disease in the vessel. 2nd Mrg lesion is 50% stenosed.  Right Coronary Artery Vessel is moderate in size. The vessel exhibits minimal luminal irregularities.  Intervention  No interventions have been documented.   STRESS TESTS  MYOCARDIAL PERFUSION IMAGING 08/29/2020  Narrative  Nuclear stress EF: 44%.  Blood pressure demonstrated a normal response to exercise.  There was no ST segment deviation noted during stress.  Exercise time: 9:30 min Workload: 10.1 METS, above average exercise capacity Test stopped due to: fatigue, SOB BP response: normal HR response: normal Rhythm: sinus rhythm with PACs at baseline. SR with stress. Sinus rhythm with PACs, PVCs, and one 5 beat run of SVT in recovery. ECG: no ischemia on stress ECG  Perfusion images show small partially reversible perfusion defect in the apical cap (segment 17) and lateral apex. Moderate perfusion defect with stress at segment 17 partially reversible to mild at rest. Mild perfusion defect in lateral apex returns to normal at rest.  Stress EF calculates to 44%, however visually appears 50%. ECG gating may have been impacted by ectopy in recovery. Consider echo to confirm EF.  Findings overall low-intermediate risk, given possible reduced EF  and small area of ischemia at the apex.   ECHOCARDIOGRAM  ECHOCARDIOGRAM COMPLETE 02/23/2023  Narrative ECHOCARDIOGRAM REPORT    Patient Name:   JAROM ROSENSTOCK Date of Exam: 02/23/2023 Medical Rec #:  [id number]         Height:       72.0 in Accession #:    [id number]        Weight:       182.4 lb Date of Birth:  [birth year]         BSA:          2.049 m Patient Age:    81 years          BP:           133/75 mmHg Patient Gender: M                 HR:           63 bpm. Exam Location:  [address]  Procedure: 2D Echo, Cardiac Doppler and Color Doppler  Indications:    Z95.2 S/P TAVR  History:        Patient has prior history of Echocardiogram examinations, most recent 03/26/2022. CAD; Arrythmias:Atrial Fibrillation. Aortic Valve: 26 mm Sapien prosthetic, stented (TAVR) valve is present in the aortic position. Procedure Date: 02/25/2022.  Sonographer:    Clearence Ped RCS Referring Phys: [phone] Oletta Buehring D Eddie Payette  IMPRESSIONS   1. Left ventricular ejection fraction, by estimation, is 60 to 65%. The left ventricle has normal function. The left ventricle has no regional wall motion abnormalities. There is mild concentric left ventricular hypertrophy. Left ventricular diastolic parameters are consistent with Grade II diastolic dysfunction (pseudonormalization). Elevated left ventricular end-diastolic pressure. 2. Right ventricular systolic function is normal. The right ventricular size is normal. There is mildly elevated pulmonary artery systolic pressure. 3. Left atrial size was mildly dilated. 4. The mitral valve is normal in structure. Mild to moderate mitral valve regurgitation. No evidence of mitral stenosis. 5. Mild perivalvular regurgitation. The aortic valve has been repaired/replaced. Aortic valve regurgitation is mild. No aortic stenosis is present. There is a 26 mm Sapien prosthetic (TAVR)  valve present in the aortic position. Procedure Date: 02/25/2022. Aortic regurgitation PHT  measures 397 msec. Aortic valve area, by VTI measures 1.11 cm. Aortic valve mean gradient measures 13.0 mmHg. Aortic valve Vmax measures 2.32 m/s. 6. The inferior vena cava is normal in size with greater than 50% respiratory variability, suggesting right atrial pressure of 3 mmHg.  Comparison(s): No significant change from prior study.  FINDINGS Left Ventricle: Left ventricular ejection fraction, by estimation, is 60 to 65%. The left ventricle has normal function. The left ventricle has no regional wall motion abnormalities. The left ventricular internal cavity size was normal in size. There is mild concentric left ventricular hypertrophy. Left ventricular diastolic parameters are consistent with Grade II diastolic dysfunction (pseudonormalization). Elevated left ventricular end-diastolic pressure.  Right Ventricle: The right ventricular size is normal. No increase in right ventricular wall thickness. Right ventricular systolic function is normal. There is mildly elevated pulmonary artery systolic pressure. The tricuspid regurgitant velocity is 3.00 m/s, and with an assumed right atrial pressure of 3 mmHg, the estimated right ventricular systolic pressure is 39.0 mmHg.  Left Atrium: Left atrial size was mildly dilated.  Right Atrium: Right atrial size was normal in size.  Pericardium: There is no evidence of pericardial effusion.  Mitral Valve: The mitral valve is normal in structure. Mild to moderate mitral valve regurgitation. No evidence of mitral valve stenosis.  Tricuspid Valve: The tricuspid valve is normal in structure. Tricuspid valve regurgitation is mild . No evidence of tricuspid stenosis.  Aortic Valve: Mild perivalvular regurgitation. The aortic valve has been repaired/replaced. Aortic valve regurgitation is mild. Aortic regurgitation PHT measures 397 msec. No aortic stenosis is present. Aortic valve mean gradient measures 13.0 mmHg. Aortic valve peak gradient measures 21.5 mmHg.  Aortic valve area, by VTI measures 1.11 cm. There is a 26 mm Sapien prosthetic, stented (TAVR) valve present in the aortic position. Procedure Date: 02/25/2022.  Pulmonic Valve: The pulmonic valve was normal in structure. Pulmonic valve regurgitation is mild to moderate. No evidence of pulmonic stenosis.  Aorta: The aortic root is normal in size and structure.  Venous: The inferior vena cava is normal in size with greater than 50% respiratory variability, suggesting right atrial pressure of 3 mmHg.  IAS/Shunts: No atrial level shunt detected by color flow Doppler.   LEFT VENTRICLE PLAX 2D LVIDd:         5.60 cm   Diastology LVIDs:         4.10 cm   LV e' medial:    5.55 cm/s LV PW:         1.20 cm   LV E/e' medial:  16.9 LV IVS:        1.20 cm   LV e' lateral:   7.83 cm/s LVOT diam:     1.80 cm   LV E/e' lateral: 12.0 LV SV:         65 LV SV Index:   32 LVOT Area:     2.54 cm   RIGHT VENTRICLE RV Basal diam:  3.40 cm RV S prime:     23.00 cm/s TAPSE (M-mode): 2.1 cm  LEFT ATRIUM             Index        RIGHT ATRIUM           Index LA diam:        4.60 cm 2.25 cm/m   RA Area:     12.60 cm LA Vol (A2C):   64.8  ml 31.63 ml/m  RA Volume:   24.50 ml  11.96 ml/m LA Vol (A4C):   61.2 ml 29.87 ml/m LA Biplane Vol: 62.8 ml 30.65 ml/m AORTIC VALVE AV Area (Vmax):    1.20 cm AV Area (Vmean):   1.10 cm AV Area (VTI):     1.11 cm AV Vmax:           232.00 cm/s AV Vmean:          170.000 cm/s AV VTI:            0.581 m AV Peak Grad:      21.5 mmHg AV Mean Grad:      13.0 mmHg LVOT Vmax:         109.00 cm/s LVOT Vmean:        73.800 cm/s LVOT VTI:          0.254 m LVOT/AV VTI ratio: 0.44 AI PHT:            397 msec  AORTA Ao Root diam: 3.30 cm Ao Asc diam:  3.60 cm  MITRAL VALVE                  TRICUSPID VALVE MV Area (PHT):                TR Peak grad:   36.0 mmHg MV Decel Time:                TR Vmax:        300.00 cm/s MR Peak grad:    67.2 mmHg MR Mean grad:     47.0 mmHg    SHUNTS MR Vmax:         410.00 cm/s  Systemic VTI:  0.25 m MR Vmean:        325.0 cm/s   Systemic Diam: 1.80 cm MR PISA:         4.02 cm MR PISA Eff ROA: 30 mm MR PISA Radius:  0.80 cm MV E velocity: 93.80 cm/s MV A velocity: 63.90 cm/s MV E/A ratio:  1.47  Chilton Si MD Electronically signed by Chilton Si MD Signature Date/Time: 02/23/2023/1:14:38 PM    Final    MONITORS  LONG TERM MONITOR (3-14 DAYS) 06/06/2022  Narrative Patch Wear Time:  13 days and 23 hours (2023-09-19T12:41:50-0400 to 2023-10-03T12:41:46-0400)  Patient had a min HR of 44 bpm (sinus bradycardia), max HR of 219 bpm (atrial fibrillation), and avg HR of 65 bpm (normal sinus rhythm). Predominant underlying rhythm was Sinus Rhythm. 46 Supraventricular Tachycardia runs occurred, the run with the fastest interval lasting 7 beats with a max rate of 207 bpm, the longest lasting 16 beats with an avg rate of 105 bpm. Atrial Fibrillation occurred (2% burden), ranging from 93-219 bpm (avg of 147 bpm), the longest lasting 6 hours 31 mins with an avg rate of 147 bpm. Supraventricular Tachycardia was detected within +/- 45 seconds of symptomatic patient event(s). Isolated SVEs were frequent (10.4%, [id number]), SVE Couplets were rare (<1.0%, 3388), and SVE Triplets were rare (<1.0%, 466). Isolated VEs were rare (<1.0%, 1484), VE Couplets were rare (<1.0%, 2), and VE Triplets were rare (<1.0%, 2).  Impression: Paroxysmal Afib detected (2% burden, max HR 219). SVT detected (likely atrial tachycardia). Frequent PACs (10.4% burden).  Gerri Spore T. Flora Lipps, MD, [facility]  [facility] [address] [place], [place] [address] [phone] 6:20 AM   CT SCANS  CT CORONARY MORPH W/CTA COR W/SCORE 02/09/2022  Addendum 02/09/2022  5:37 PM ADDENDUM REPORT: 02/09/2022 17:35  CLINICAL DATA:  Severe Aortic Stenosis.  EXAM: Cardiac TAVR CT  TECHNIQUE: A non-contrast, gated CT scan was  obtained with axial slices of 3 mm through the heart for aortic valve calcium scoring. A 120 kV retrospective, gated, contrast cardiac scan was obtained. Gantry rotation speed was 250 msecs and collimation was 0.6 mm. Nitroglycerin was not given. The 3D data set was reconstructed in 5% intervals of the 0-95% of the R-R cycle. Systolic and diastolic phases were analyzed on a dedicated workstation using MPR, MIP, and VRT modes. The patient received 100 cc of contrast.  FINDINGS: Image quality: Excellent.  Noise artifact is: Limited.  Valve Morphology: Tricuspid aortic valve with severe diffuse calcifications.  Aortic Valve Calcium score: 2900  Aortic annular dimension:  Phase assessed: 25%  Annular area: 524 mm2  Annular perimeter: 82.6 mm  Max diameter: 29.5 mm  Min diameter: 22.9 mm  Annular and subannular calcification: None.  Membranous septum length: 11.5 mm  Optimal coplanar projection: LAO 4 CRA 4  Coronary Artery Height above Annulus:  Left Main: 13.7 mm  Right Coronary: 16.2 mm  Sinus of Valsalva Measurements:  Non-coronary: 32 mm  Right-coronary: 31 mm  Left-coronary: 32 mm  Sinus of Valsalva Height:  Non-coronary: 20.4 mm  Right-coronary: 18.9 mm  Left-coronary: 21.7 mm  Sinotubular Junction: 30 mm  Ascending Thoracic Aorta: 34 mm  Coronary Arteries: Normal coronary origin. Left dominance. The study was performed without use of NTG and is insufficient for plaque evaluation. Please refer to recent cardiac catheterization for coronary assessment. Severe coronary calcifications noted.  Cardiac Morphology:  Right Atrium: Right atrial size is within normal limits.  Right Ventricle: The right ventricular cavity is within normal limits.  Left Atrium: Left atrial size is dilated in size with no left atrial appendage filling defect.  Left Ventricle: The ventricular cavity size is within normal limits.  Pulmonary arteries: Dilated  pulmonary artery suggestive of pulmonary hypertension.  Pulmonary veins: Normal pulmonary venous drainage.  Pericardium: Normal thickness with no significant effusion or calcium present.  Mitral Valve: The mitral valve is normal structure without significant calcification.  Extra-cardiac findings: See attached radiology report for non-cardiac structures.  IMPRESSION: 1. Annular measurements favor a 26 mm S3 (524 mm2). Could consider a 29 mm Evolut Pro.  2. No significant annular or subannular calcifications.  3. Sufficient coronary to annulus distance.  4. Optimal Fluoroscopic Angle for Delivery: LAO 4 CRA 4  [place] T. Flora Lipps, MD   Electronically Signed By: Lennie Odor M.D. On: 02/09/2022 17:35  Narrative EXAM: OVER-READ INTERPRETATION  CT CHEST  The following report is a limited chest CT over-read performed by radiologist Dr. Allegra Lai of [facility] on 02/06/2022. This over-read does not include interpretation of cardiac or coronary anatomy or pathology. The cardiac CTA interpretation by the cardiologist is attached.  COMPARISON:  None Available.  FINDINGS: Extracardiac findings will be described separately under dictation for contemporaneously obtained CTA chest, abdomen and pelvis.  IMPRESSION: Please see separate dictation for contemporaneously obtained CTA chest, abdomen and pelvis dated 02/06/2022 for full description of relevant extracardiac findings.  Electronically Signed: By: Allegra Lai M.D. On: 02/06/2022 11:16   CT SCANS  CT CARDIAC SCORING (SELF PAY ONLY) 08/30/2020  Addendum 08/30/2020  4:30 PM ADDENDUM REPORT: 08/30/2020 16:27  CLINICAL DATA:  Risk stratification  EXAM: Coronary Calcium Score  TECHNIQUE: The patient was scanned on a [employer] scanner. Axial non-contrast 3 mm slices were  carried out through the heart. The data set was analyzed on a dedicated work station and scored using the Agatson  method.  FINDINGS: Non-cardiac: See separate report from [facility].  Ascending Aorta: Normal caliber.  Pericardium: Normal.  Coronary arteries: Normal origins.  IMPRESSION: Coronary calcium score of 682. This was 62nd percentile for age and sex matched controls.  Lennie Odor, MD   Electronically Signed By: Lennie Odor On: 08/30/2020 16:27  Narrative EXAM: OVER-READ INTERPRETATION  CT CHEST  The following report is an over-read performed by radiologist Dr. Trudie Reed of [facility] on 08/30/2020. This over-read does not include interpretation of cardiac or coronary anatomy or pathology. The coronary calcium score interpretation by the cardiologist is attached.  COMPARISON:  None.  FINDINGS: Aortic atherosclerosis. Within the visualized portions of the thorax there are no suspicious appearing pulmonary nodules or masses, there is no acute consolidative airspace disease, no pleural effusions, no pneumothorax and no lymphadenopathy. Visualized portions of the upper abdomen are unremarkable. There are no aggressive appearing lytic or blastic lesions noted in the visualized portions of the skeleton.  IMPRESSION: 1.  Aortic Atherosclerosis (ICD10-I70.0).  Electronically Signed: By: Trudie Reed M.D. On: 08/30/2020 09:15          EKG:  EKG is not ordered today.  Recent Labs: 02/26/2022: Hemoglobin 11.6; Platelets 89 12/08/2022: BUN 16; Creatinine, Ser 0.95; Magnesium 2.0; Potassium 4.7; Sodium 136  Recent Lipid Panel No results found for: "CHOL", "TRIG", "HDL", "CHOLHDL", "VLDL", "LDLCALC", "LDLDIRECT"  Physical Exam:    VS:  BP 122/62   Pulse (!) 47   Ht 6' (1.829 m)   Wt 180 lb (81.6 kg)   SpO2 99%   BMI 24.41 kg/m     Wt Readings from Last 3 Encounters:  02/23/23 180 lb (81.6 kg)  02/09/23 182 lb 6.4 oz (82.7 kg)  12/08/22 187 lb 12.8 oz (85.2 kg)    General: Well developed, well nourished, NAD Lungs:Clear to  ausculation bilaterally. No wheezes, rales, or rhonchi. Breathing is unlabored. Cardiovascular: RRR with S1 S2. No murmurs Extremities: No edema. Neuro: Alert and oriented. No focal deficits. No facial asymmetry. MAE spontaneously. Psych: Responds to questions appropriately with normal affect.    ASSESSMENT/PLAN:    Severe AS s/p TAVR: Doing very well with NYHA class I symptoms s/p TAVR with 26mm S3UR valve. Echo today with normal LV function, mild to mod MR, and mild PVL with a mean gradient at , peak 21.71mmHg, AVA by VTI 1.11cm2, and DI at 0.44. Continue lifelong dental SBE>>>supplied by dentist and Eliquis monotherapy. Plan regular follow up with Dr. Flora Lipps.  PAF: Follows with Dr. Nelly Laurence. Maintained in Tikosyn.   HTN: Stable with no changes needed.   CAD: No complaints of chest pain. No ASA in the setting of Eliquis.Continue current regimen.   Medication Adjustments/Labs and Tests Ordered: Current medicines are reviewed at length with the patient today.  Concerns regarding medicines are outlined above.  No orders of the defined types were placed in this encounter.  No orders of the defined types were placed in this encounter.   Patient Instructions  Medication Instructions:  Your physician recommends that you continue on your current medications as directed. Please refer to the Current Medication list given to you today.  *If you need a refill on your cardiac medications before your next appointment, please call your pharmacy*   Lab Work: NONE If you have labs (blood work) drawn today and your tests are completely normal,  you will receive your results only by: MyChart Message (if you have MyChart) OR A paper copy in the mail If you have any lab test that is abnormal or we need to change your treatment, we will call you to review the results.   Testing/Procedures: NONE   Follow-Up: At [facility], you and your health needs are our priority.  As part of our  continuing mission to provide you with exceptional heart care, we have created designated Provider Care Teams.  These Care Teams include your primary Cardiologist (physician) and Advanced Practice Providers (APPs -  Physician Assistants and Nurse Practitioners) who all work together to provide you with the care you need, when you need it.  We recommend signing up for the patient portal called "MyChart".  Sign up information is provided on this After Visit Summary.  MyChart is used to connect with patients for Virtual Visits (Telemedicine).  Patients are able to view lab/test results, encounter notes, upcoming appointments, etc.  Non-urgent messages can be sent to your provider as well.   To learn more about what you can do with MyChart, go to [email or web address].    Your next appointment:   KEEP SCHEDULED FOLLOW-UP   Signed, Georgie Chard, NP  02/24/2023 11:06 AM    [facility]

## 2023-03-19 DIAGNOSIS — M25561 Pain in right knee: Secondary | ICD-10-CM | POA: Diagnosis not present

## 2023-04-03 ENCOUNTER — Ambulatory Visit: Payer: Medicare Other | Admitting: Urology

## 2023-04-06 ENCOUNTER — Other Ambulatory Visit: Payer: Self-pay | Admitting: Urology

## 2023-04-10 ENCOUNTER — Encounter: Payer: Self-pay | Admitting: Neurology

## 2023-04-13 NOTE — Telephone Encounter (Signed)
We need an appointment to discuss these options. I have not seen them since January. Thanks

## 2023-04-20 ENCOUNTER — Other Ambulatory Visit: Payer: Self-pay | Admitting: Cardiovascular Disease

## 2023-05-04 DIAGNOSIS — Z23 Encounter for immunization: Secondary | ICD-10-CM | POA: Diagnosis not present

## 2023-05-04 DIAGNOSIS — H16139 Photokeratitis, unspecified eye: Secondary | ICD-10-CM | POA: Diagnosis not present

## 2023-05-13 ENCOUNTER — Ambulatory Visit: Payer: Medicare Other | Admitting: Urology

## 2023-05-13 ENCOUNTER — Encounter: Payer: Self-pay | Admitting: Urology

## 2023-05-13 VITALS — BP 118/55 | HR 67

## 2023-05-13 DIAGNOSIS — N529 Male erectile dysfunction, unspecified: Secondary | ICD-10-CM

## 2023-05-13 DIAGNOSIS — R350 Frequency of micturition: Secondary | ICD-10-CM | POA: Diagnosis not present

## 2023-05-13 DIAGNOSIS — N401 Enlarged prostate with lower urinary tract symptoms: Secondary | ICD-10-CM

## 2023-05-13 DIAGNOSIS — N138 Other obstructive and reflux uropathy: Secondary | ICD-10-CM | POA: Diagnosis not present

## 2023-05-13 LAB — URINALYSIS, ROUTINE W REFLEX MICROSCOPIC
Bilirubin, UA: NEGATIVE
Glucose, UA: NEGATIVE
Ketones, UA: NEGATIVE
Leukocytes,UA: NEGATIVE
Nitrite, UA: NEGATIVE
Protein,UA: NEGATIVE
RBC, UA: NEGATIVE
Specific Gravity, UA: 1.01 (ref 1.005–1.030)
Urobilinogen, Ur: 0.2 mg/dL (ref 0.2–1.0)
pH, UA: 6.5 (ref 5.0–7.5)

## 2023-05-13 MED ORDER — FINASTERIDE 5 MG PO TABS
5.0000 mg | ORAL_TABLET | Freq: Every day | ORAL | 3 refills | Status: DC
  Filled 2023-07-01 – 2023-07-27 (×2): qty 90, 90d supply, fill #0
  Filled 2023-10-21 – 2023-10-24 (×2): qty 90, 90d supply, fill #1
  Filled 2024-01-05: qty 90, 90d supply, fill #2
  Filled 2024-01-28 – 2024-04-19 (×3): qty 90, 90d supply, fill #3

## 2023-05-13 MED ORDER — TADALAFIL 20 MG PO TABS
20.0000 mg | ORAL_TABLET | Freq: Every day | ORAL | 5 refills | Status: DC | PRN
  Filled 2023-07-01: qty 10, 10d supply, fill #0

## 2023-05-13 MED ORDER — ALFUZOSIN HCL ER 10 MG PO TB24
10.0000 mg | ORAL_TABLET | Freq: Every day | ORAL | 11 refills | Status: DC
  Filled 2023-07-01 – 2023-08-24 (×2): qty 30, 30d supply, fill #0
  Filled 2023-09-19: qty 30, 30d supply, fill #1
  Filled 2023-10-21 – 2023-10-24 (×2): qty 30, 30d supply, fill #2
  Filled 2023-12-10: qty 30, 30d supply, fill #3
  Filled 2023-12-12 – 2024-01-05 (×2): qty 30, 30d supply, fill #4
  Filled 2024-01-28: qty 30, 30d supply, fill #5
  Filled 2024-03-08: qty 30, 30d supply, fill #6
  Filled 2024-04-04: qty 30, 30d supply, fill #7
  Filled 2024-04-19 – 2024-04-26 (×2): qty 30, 30d supply, fill #8

## 2023-05-13 NOTE — Patient Instructions (Signed)

## 2023-05-13 NOTE — Progress Notes (Signed)
05/13/2023 11:48 AM   [name] [birth year] [id number]  Referring provider: Noberto Retort, MD [phone] [place],  [place] [address]  Followup BPh and Erectile dysfunction   HPI: [name] is a 82yo here for followup for BPH and erectile dysfunction.  IPSS 2 QOL 1 on uroxatral 10mg  and finasteride. Urine stream strong. No straining to urinate. Nocturia 1x. Urinary frequency every 3-5 hours. He has not tried tadalafil since he did not clear the therapy with his cardiologist.   PMH: Past Medical History:  Diagnosis Date   Cancer (HCC)    colon cancer and melanoma   Complication of anesthesia    difficult to wake up after surgery   Coronary artery disease    DJD (degenerative joint disease)    Dr. Thurston Hole end stages of DJD 2011   Hard of hearing    History of hiatal hernia    PAF (paroxysmal atrial fibrillation) (HCC)    S/P TAVR (transcatheter aortic valve replacement) 02/25/2022   S3UR 26mm via TF apprach with Dr. Lynnette Caffey and Dr. Laneta Simmers   Severe aortic stenosis    Urinary frequency    UTI (lower urinary tract infection)     Surgical History: Past Surgical History:  Procedure Laterality Date   ANKLE SURGERY Right    COLONOSCOPY W/ BIOPSIES AND POLYPECTOMY     ELBOW SURGERY Right    EYE SURGERY     HERNIA REPAIR     INTRAOPERATIVE TRANSTHORACIC ECHOCARDIOGRAM N/A 02/25/2022   Procedure: INTRAOPERATIVE TRANSTHORACIC ECHOCARDIOGRAM;  Surgeon: Orbie Pyo, MD;  Location: [facility] INVASIVE CV LAB;  Service: Open Heart Surgery;  Laterality: N/A;   JOINT REPLACEMENT     KNEE SURGERY Right    x4   LEFT HEART CATH AND CORONARY ANGIOGRAPHY N/A 09/12/2020   Procedure: LEFT HEART CATH AND CORONARY ANGIOGRAPHY;  Surgeon: Tonny Bollman, MD;  [facility] INVASIVE CV LAB;  Service: Cardiovascular;  Laterality: N/A;   MULTIPLE TOOTH EXTRACTIONS     RIGHT/LEFT HEART CATH AND CORONARY ANGIOGRAPHY N/A 01/23/2022   Procedure: RIGHT/LEFT HEART CATH AND  CORONARY ANGIOGRAPHY;  Surgeon: Tonny Bollman, MD;  Location: [facility] INVASIVE CV LAB;  Service: Cardiovascular;  Laterality: N/A;   TOTAL KNEE ARTHROPLASTY Right    TOTAL KNEE ARTHROPLASTY Left 06/18/2015   Procedure: TOTAL KNEE ARTHROPLASTY;  Surgeon: Gean Birchwood, MD;  Location: [facility] OR;  Service: Orthopedics;  Laterality: Left;   TOTAL KNEE REVISION Right 02/11/2021   Procedure: RIGHT TOTAL KNEE REVISION;  Surgeon: Gean Birchwood, MD;  Location: [facility] ORS;  Service: Orthopedics;  Laterality: Right;   TRANSCATHETER AORTIC VALVE REPLACEMENT, TRANSFEMORAL N/A 02/25/2022   Procedure: Transcatheter Aortic Valve Replacement, Transfemoral;  Surgeon: Orbie Pyo, MD;  Location: [facility] INVASIVE CV LAB;  Service: Open Heart Surgery;  Laterality: N/A;    Home Medications:  Allergies as of 05/13/2023   No Known Allergies      Medication List        Accurate as of May 13, 2023 11:48 AM. If you have any questions, ask your nurse or doctor.          acetaminophen 650 MG CR tablet Commonly known as: TYLENOL Take 650 mg by mouth every 8 (eight) hours as needed for pain.   alfuzosin 10 MG 24 hr tablet Commonly known as: UROXATRAL Take 1 tablet (10 mg total) by mouth at bedtime.   amoxicillin 500 MG capsule Commonly known as: AMOXIL Take 2,000 mg by mouth See admin instructions. Take  2000 mg 1 hour prior to dental work   atorvastatin 10 MG tablet Commonly known as: LIPITOR TAKE 1 TABLET ON MONDAY,WEDNESDAYS AND FRIDAYS   cholecalciferol 25 MCG (1000 UNIT) tablet Commonly known as: VITAMIN D3 Take 2,000 Units by mouth daily.   CURCUMIN 95 PO Take by mouth.   cyanocobalamin 1000 MCG tablet Commonly known as: VITAMIN B12 Take 1,000 mcg by mouth daily.   dofetilide 500 MCG capsule Commonly known as: TIKOSYN TAKE ONE CAPSULE BY MOUTH TWICE DAILY   doxycycline 100 MG tablet Commonly known as: VIBRA-TABS 2 (two) times daily.   Eliquis 5 MG Tabs tablet Generic drug: apixaban TAKE 1  TABLET BY MOUTH TWICE A DAY   finasteride 5 MG tablet Commonly known as: PROSCAR TAKE 1 TABLET (5 MG TOTAL) BY MOUTH DAILY AT 12 NOON.   saccharomyces boulardii 250 MG capsule Commonly known as: FLORASTOR as directed Orally   valACYclovir 500 MG tablet Commonly known as: VALTREX Take 500 mg by mouth 2 (two) times daily as needed (Cold sore). for 5 days at onset of cold sore        Allergies: No Known Allergies  Family History: Family History  Problem Relation Age of Onset   Heart disease Father    Heart attack Mother        age 52   Breast cancer Sister    Breast cancer Sister     Social History:  reports that he has never smoked. He has never used smokeless tobacco. He reports that he does not drink alcohol and does not use drugs.  ROS: All other review of systems were reviewed and are negative except what is noted above in HPI  Physical Exam: BP (!) 118/55   Pulse 67   Constitutional:  Alert and oriented, No acute distress. HEENT: [place] AT, moist mucus membranes.  Trachea midline, no masses. Cardiovascular: No clubbing, cyanosis, or edema. Respiratory: Normal respiratory effort, no increased work of breathing. GI: Abdomen is soft, nontender, nondistended, no abdominal masses GU: No CVA tenderness.  Lymph: No cervical or inguinal lymphadenopathy. Skin: No rashes, bruises or suspicious lesions. Neurologic: Grossly intact, no focal deficits, moving all 4 extremities. Psychiatric: Normal mood and affect.  Laboratory Data: Lab Results  Component Value Date   WBC 5.7 02/26/2022   HGB 11.6 (L) 02/26/2022   HCT 34.0 (L) 02/26/2022   MCV 88.5 02/26/2022   PLT 89 (L) 02/26/2022    Lab Results  Component Value Date   CREATININE 0.95 12/08/2022    No results found for: "PSA"  No results found for: "TESTOSTERONE"  No results found for: "HGBA1C"  Urinalysis    Component Value Date/Time   COLORURINE YELLOW 02/21/2022 0922   APPEARANCEUR Clear 03/31/2022 1111    LABSPEC 1.016 02/21/2022 0922   PHURINE 6.0 02/21/2022 0922   GLUCOSEU Negative 03/31/2022 1111   HGBUR NEGATIVE 02/21/2022 0922   BILIRUBINUR Negative 03/31/2022 1111   KETONESUR NEGATIVE 02/21/2022 0922   PROTEINUR Negative 03/31/2022 1111   PROTEINUR NEGATIVE 02/21/2022 0922   UROBILINOGEN 0.2 06/07/2015 0912   NITRITE Negative 03/31/2022 1111   NITRITE NEGATIVE 02/21/2022 0922   LEUKOCYTESUR Negative 03/31/2022 1111   LEUKOCYTESUR NEGATIVE 02/21/2022 0922    Lab Results  Component Value Date   LABMICR See below: 03/31/2022   WBCUA 0-5 03/31/2022   LABEPIT 0-10 03/31/2022   MUCUS Present (A) 03/31/2022   BACTERIA None seen 03/31/2022    Pertinent Imaging:  No results found for this or any previous visit.  No results found for this or any previous visit.  No results found for this or any previous visit.  No results found for this or any previous visit.  No results found for this or any previous visit.  No valid procedures specified. No results found for this or any previous visit.  No results found for this or any previous visit.   Assessment & Plan:    1. Benign prostatic hyperplasia with urinary obstruction -continue uroxatral 10mg  and finasteride 5mg  daily - Urinalysis, Routine w reflex microscopic  2. Urinary frequency Continue uroxatral 10mg  daily and finasteride 5mg  daily  3. Erectile dysfunction, unspecified erectile dysfunction type -Patient to call cardiology and inquire if it is safe for him to take PDE5s.    No follow-ups on file.  Wilkie Aye, MD  [facility] [place]

## 2023-06-03 NOTE — Progress Notes (Unsigned)
Cardiology Office Note:  .   Date:  06/04/2023  ID:  Gerald Spears, DOB 06-25-1941, MRN [id number] PCP: Gerald Retort, MD  [facility] Providers Cardiologist:  Reatha Harps, MD Electrophysiologist:  Maurice Small, MD }   History of Present Illness: .   Gerald Spears is a 82 y.o. male with below history who presents for follow-up.   Discussed the use of AI scribe software for clinical note transcription with the patient, who gave verbal consent to proceed.  History of Present Illness   The patient, an 82 year old male with a history of severe aortic stenosis status post TAVR, CAD managed medically, hyperlipidemia, persistent atrial fibrillation maintained on Tikosyn, and mild cognitive impairment, presents for a follow-up visit. The patient's wife reports that the patient experienced chest tightness earlier in the day, which is a new complaint. The patient confirms this, stating that the tightness comes on randomly and does not seem to be triggered by any specific activity. The episode of chest tightness resolved within about ten minutes. The patient is not particularly active but moves around his three-story house throughout the day. The patient denies any trouble breathing, but does experience dizziness and lightheadedness when getting up quickly. This has improved since the patient started drinking more water. The patient drinks at least one bottle of Propel and water with all three meals daily. The patient also reports easy bruising on the arms, which is likely due to the Eliquis. HR 40-50 bpm, regular. No dizziness or lightheaded symptoms.            Problem List 1. Severe aortic stenosis -26 mm S3 TAVR 02/25/2022 -mild PVL 2. Mitral regurgitation, moderate 3. CAD -LHC 09/12/2020: D2 80% 4. HLD -T chol 180, LDL 106, HDL 61, TG 71 5. PACs -5.8% burden -brief ectopic atrial tachycardia  6. Atrial fibrillation, persistent 02/19/2021 -converted to NSR in  ER -CHADSVASC= 3 (age, CAD) -On Tikosyn 7.  Mild cognitive impairment    ROS: All other ROS reviewed and negative. Pertinent positives noted in the HPI.     Studies Reviewed: Marland Kitchen       Physical Exam:   VS:  BP 114/62 (BP Location: Left Arm, Patient Position: Sitting, Cuff Size: Normal)   Pulse (!) 40   Ht 6' (1.829 m)   Wt 183 lb 9.6 oz (83.3 kg)   SpO2 98%   BMI 24.90 kg/m    Wt Readings from Last 3 Encounters:  06/04/23 183 lb 9.6 oz (83.3 kg)  02/23/23 180 lb (81.6 kg)  02/09/23 182 lb 6.4 oz (82.7 kg)    GEN: Well nourished, well developed in no acute distress NECK: No JVD; No carotid bruits CARDIAC: RRR, no murmurs, rubs, gallops RESPIRATORY:  Clear to auscultation without rales, wheezing or rhonchi  ABDOMEN: Soft, non-tender, non-distended EXTREMITIES:  No edema; No deformity  ASSESSMENT AND PLAN: .   Assessment and Plan    Severe Aortic Stenosis, status post TAVR No current issues reported. Patient understands the need for antibiotics for dental work. -Continue current management and follow-up in 6 months. -stable valve on recent echo   CAD Patient reported chest discomfort, however, it does not appear to be cardiac in nature. -80% Diagonal lesion managed medically.  -no ASA due to eliquis.  -Continue Lipitor.  Mitral Valve Regurgitation Moderate severity, currently being monitored. -Plan for repeat echo next year.  Persistent Atrial Fibrillation Maintained on Tikosyn and Eliquis for stroke prophylaxis. Baseline bradycardia noted, no beta blockers  due to this. -Continue current management and follow-up in 6 months.  Sinus Bradycardia Baseline bradycardia noted, no beta blockers due to this. -Continue current management and follow-up in 6 months.             Follow-up: Return in about 6 months (around 12/03/2023).  Time Spent with Patient: I have spent a total of 25 minutes with patient reviewing hospital notes, telemetry, EKGs, labs and examining the  patient as well as establishing an assessment and plan that was discussed with the patient.  > 50% of time was spent in direct patient care.  Signed, Lenna Gilford. Flora Lipps, MD, [facility]  [facility]  [address] [place], [place] [address] [phone]  11:40 AM

## 2023-06-04 ENCOUNTER — Ambulatory Visit: Payer: Medicare Other | Attending: Cardiovascular Disease | Admitting: Cardiovascular Disease

## 2023-06-04 ENCOUNTER — Encounter: Payer: Self-pay | Admitting: Cardiovascular Disease

## 2023-06-04 VITALS — BP 114/62 | HR 40 | Ht 72.0 in | Wt 183.6 lb

## 2023-06-04 DIAGNOSIS — D6869 Other thrombophilia: Secondary | ICD-10-CM | POA: Insufficient documentation

## 2023-06-04 DIAGNOSIS — E782 Mixed hyperlipidemia: Secondary | ICD-10-CM | POA: Diagnosis not present

## 2023-06-04 DIAGNOSIS — I25119 Atherosclerotic heart disease of native coronary artery with unspecified angina pectoris: Secondary | ICD-10-CM | POA: Insufficient documentation

## 2023-06-04 DIAGNOSIS — I340 Nonrheumatic mitral (valve) insufficiency: Secondary | ICD-10-CM | POA: Insufficient documentation

## 2023-06-04 DIAGNOSIS — Z952 Presence of prosthetic heart valve: Secondary | ICD-10-CM | POA: Diagnosis not present

## 2023-06-04 DIAGNOSIS — I480 Paroxysmal atrial fibrillation: Secondary | ICD-10-CM | POA: Diagnosis not present

## 2023-06-04 NOTE — Patient Instructions (Signed)
Medication Instructions:  NO CHANGES    *If you need a refill on your cardiac medications before your next appointment, please call your pharmacy*   Lab Work: NONE    If you have labs (blood work) drawn today and your tests are completely normal, you will receive your results only by: MyChart Message (if you have MyChart) OR A paper copy in the mail If you have any lab test that is abnormal or we need to change your treatment, we will call you to review the results.   Testing/Procedures: NONE    Follow-Up: At [facility], you and your health needs are our priority.  As part of our continuing mission to provide you with exceptional heart care, we have created designated Provider Care Teams.  These Care Teams include your primary Cardiologist (physician) and Advanced Practice Providers (APPs -  Physician Assistants and Nurse Practitioners) who all work together to provide you with the care you need, when you need it.  We recommend signing up for the patient portal called "MyChart".  Sign up information is provided on this After Visit Summary.  MyChart is used to connect with patients for Virtual Visits (Telemedicine).  Patients are able to view lab/test results, encounter notes, upcoming appointments, etc.  Non-urgent messages can be sent to your provider as well.   To learn more about what you can do with MyChart, go to [email or web address].    Your next appointment:   6 month(s)  The format for your next appointment:   In Person  Provider:   Reatha Harps, MD

## 2023-06-09 ENCOUNTER — Ambulatory Visit (HOSPITAL_COMMUNITY)
Admission: RE | Admit: 2023-06-09 | Discharge: 2023-06-09 | Disposition: A | Discharge status: Home / Self Care | Payer: Medicare Other | Source: Ambulatory Visit | Attending: Physician Assistant | Admitting: Physician Assistant

## 2023-06-09 ENCOUNTER — Encounter (HOSPITAL_COMMUNITY): Payer: Self-pay | Admitting: Physician Assistant

## 2023-06-09 VITALS — BP 112/80 | HR 61 | Ht 72.0 in | Wt 183.6 lb

## 2023-06-09 DIAGNOSIS — Z5181 Encounter for therapeutic drug level monitoring: Secondary | ICD-10-CM | POA: Diagnosis not present

## 2023-06-09 DIAGNOSIS — I2510 Atherosclerotic heart disease of native coronary artery without angina pectoris: Secondary | ICD-10-CM | POA: Insufficient documentation

## 2023-06-09 DIAGNOSIS — D6869 Other thrombophilia: Secondary | ICD-10-CM | POA: Diagnosis not present

## 2023-06-09 DIAGNOSIS — Z7901 Long term (current) use of anticoagulants: Secondary | ICD-10-CM | POA: Diagnosis not present

## 2023-06-09 DIAGNOSIS — Z79899 Other long term (current) drug therapy: Secondary | ICD-10-CM

## 2023-06-09 DIAGNOSIS — Z952 Presence of prosthetic heart valve: Secondary | ICD-10-CM | POA: Diagnosis not present

## 2023-06-09 DIAGNOSIS — E785 Hyperlipidemia, unspecified: Secondary | ICD-10-CM | POA: Diagnosis not present

## 2023-06-09 DIAGNOSIS — I480 Paroxysmal atrial fibrillation: Secondary | ICD-10-CM

## 2023-06-09 DIAGNOSIS — I350 Nonrheumatic aortic (valve) stenosis: Secondary | ICD-10-CM | POA: Diagnosis not present

## 2023-06-09 LAB — BASIC METABOLIC PANEL
Anion gap: 8 (ref 5–15)
BUN: 19 mg/dL (ref 8–23)
CO2: 23 mmol/L (ref 22–32)
Calcium: 9.3 mg/dL (ref 8.9–10.3)
Chloride: 105 mmol/L (ref 98–111)
Creatinine, Ser: 0.91 mg/dL (ref 0.61–1.24)
GFR, Estimated: >60 mL/min (ref >60)
Glucose, Bld: 95 mg/dL (ref 70–99)
Potassium: 4.3 mmol/L (ref 3.5–5.1)
Sodium: 136 mmol/L (ref 135–145)

## 2023-06-09 LAB — MAGNESIUM: Magnesium: 2.1 mg/dL (ref 1.7–2.4)

## 2023-06-09 NOTE — Progress Notes (Signed)
Primary Care Physician: Noberto Retort, MD Primary Cardiologist: Dr Flora Lipps Primary Electrophysiologist: Dr Nelly Laurence Referring Physician: Dr Jasmine Pang is a 82 y.o. male with a history of severe aortic stenosis status post TAVR, persistent atrial fibrillation, CAD, hyperlipidemia who presents for follow up in the [facility]. Patient is on Eliquis for a CHADS2VASC score of 3. He was previously on metoprolol but had symptomatic bradycardia and this was discontinued. However, his afib burden increased. He was seen by Dr Nelly Laurence who recommended dofetilide vs ablation, patient opted for dofetilide. S/p dofetilide admission 12/5-12/8/23.  On follow up today, patient reports that he has done well since his last visit. He has not had any interim symptoms of afib. He has not had any significant bleeding issues on anticoagulation but he is concerned about bruising.   Today, he denies symptoms of palpitations, chest pain, shortness of breath, orthopnea, PND, lower extremity edema, dizziness, presyncope, syncope, snoring, daytime somnolence, bleeding, or neurologic sequela. The patient is tolerating medications without difficulties and is otherwise without complaint today.    Atrial Fibrillation Risk Factors:  he does not have symptoms or diagnosis of sleep apnea. he does not have a history of rheumatic fever.   Atrial Fibrillation Management history:  Previous antiarrhythmic drugs: dofetilide  Previous cardioversions: none Previous ablations: none Anticoagulation history: Eliquis   Past Medical History:  Diagnosis Date   Cancer (HCC)    colon cancer and melanoma   Complication of anesthesia    difficult to wake up after surgery   Coronary artery disease    DJD (degenerative joint disease)    Dr. Thurston Hole end stages of DJD 2011   Hard of hearing    History of hiatal hernia    PAF (paroxysmal atrial fibrillation) (HCC)    S/P TAVR  (transcatheter aortic valve replacement) 02/25/2022   S3UR 26mm via TF apprach with Dr. Lynnette Caffey and Dr. Laneta Simmers   Severe aortic stenosis    Urinary frequency    UTI (lower urinary tract infection)     Current Outpatient Medications  Medication Sig Dispense Refill   acetaminophen (TYLENOL) 650 MG CR tablet Take 650 mg by mouth every 8 (eight) hours as needed for pain.     alfuzosin (UROXATRAL) 10 MG 24 hr tablet Take 1 tablet (10 mg total) by mouth at bedtime. 30 tablet 11   amoxicillin (AMOXIL) 500 MG capsule Take 2,000 mg by mouth See admin instructions. Take 2000 mg 1 hour prior to dental work     atorvastatin (LIPITOR) 10 MG tablet TAKE 1 TABLET ON MONDAY,WEDNESDAYS AND FRIDAYS 36 tablet 9   cholecalciferol (VITAMIN D3) 25 MCG (1000 UNIT) tablet Take 2,000 Units by mouth daily.     dofetilide (TIKOSYN) 500 MCG capsule TAKE ONE CAPSULE BY MOUTH TWICE DAILY 180 capsule 1   doxycycline (VIBRA-TABS) 100 MG tablet 2 (two) times daily.     ELIQUIS 5 MG TABS tablet TAKE 1 TABLET BY MOUTH TWICE A DAY 180 tablet 1   finasteride (PROSCAR) 5 MG tablet Take 1 tablet (5 mg total) by mouth daily at 12 noon. 90 tablet 3   saccharomyces boulardii (FLORASTOR) 250 MG capsule as directed Orally     tadalafil (CIALIS) 20 MG tablet Take 1 tablet (20 mg total) by mouth daily as needed. 10 tablet 5   Turmeric (CURCUMIN 95 PO) Take by mouth.     valACYclovir (VALTREX) 500 MG tablet Take 500 mg by mouth 2 (two) times  daily as needed (Cold sore). for 5 days at onset of cold sore  2   vitamin B-12 (CYANOCOBALAMIN) 1000 MCG tablet Take 1,000 mcg by mouth daily.     No current facility-administered medications for this encounter.    ROS- All systems are reviewed and negative except as per the HPI above.  Physical Exam: Vitals:   06/09/23 0912  BP: 112/80  Pulse: 61  Weight: 83.3 kg  Height: 6' (1.829 m)    GEN: Well nourished, well developed in no acute distress NECK: No JVD; No carotid bruits CARDIAC:  Regular rate and rhythm with occasional ectopy, no murmurs, rubs, gallops RESPIRATORY:  Clear to auscultation without rales, wheezing or rhonchi  ABDOMEN: Soft, non-tender, non-distended EXTREMITIES:  No edema; No deformity    Wt Readings from Last 3 Encounters:  06/09/23 83.3 kg  06/04/23 83.3 kg  02/23/23 81.6 kg    EKG today demonstrates  SR, PACs Vent. rate 61 BPM PR interval 122 ms QRS duration 100 ms QT/QTcB 450/453 ms   Echo 02/23/23 demonstrated   1. Left ventricular ejection fraction, by estimation, is 60 to 65%. The  left ventricle has normal function. The left ventricle has no regional  wall motion abnormalities. There is mild concentric left ventricular  hypertrophy. Left ventricular diastolic parameters are consistent with Grade II diastolic dysfunction (pseudonormalization). Elevated left ventricular end-diastolic pressure.   2. Right ventricular systolic function is normal. The right ventricular  size is normal. There is mildly elevated pulmonary artery systolic  pressure.   3. Left atrial size was mildly dilated.   4. The mitral valve is normal in structure. Mild to moderate mitral valve  regurgitation. No evidence of mitral stenosis.   5. Mild perivalvular regurgitation. The aortic valve has been  repaired/replaced. Aortic valve regurgitation is mild. No aortic stenosis  is present. There is a 26 mm Sapien prosthetic (TAVR) valve present in the  aortic position. Procedure Date:  02/25/2022. Aortic regurgitation PHT measures 397 msec. Aortic valve area,  by VTI measures 1.11 cm. Aortic valve mean gradient measures 13.0 mmHg.  Aortic valve Vmax measures 2.32 m/s.   6. The inferior vena cava is normal in size with greater than 50%  respiratory variability, suggesting right atrial pressure of 3 mmHg.   Epic records are reviewed at length today  CHA2DS2-VASc Score = 3  The patient's score is based upon: CHF History: 0 HTN History: 0 Diabetes History:  0 Stroke History: 0 Vascular Disease History: 1 Age Score: 2 Gender Score: 0       ASSESSMENT AND PLAN: Paroxysmal Atrial Fibrillation/atrial tach The patient's CHA2DS2-VASc score is 3, indicating a 3.2% annual risk of stroke.   S/p dofetilide admission 12/5-12/8/23. Patient appears to be maintaining SR Continue dofetilide 500 mcg BID, QT stable Check bmet/mag today Continue Eliquis 5 mg BID Off AV nodal agents with h/o symptomatic bradycardia.   Secondary Hypercoagulable State (ICD10:  D68.69) The patient is at significant risk for stroke/thromboembolism based upon his CHA2DS2-VASc Score of 3.  Continue Apixaban (Eliquis). Patient concerned about his bruising and bleeding risk on anticoagulation. Watchman brochure given today. They will consider it and reach out to [place] or Dr Flora Lipps if they decide to proceed with a consultation.   Valvular heart disease Severe AS s/p TAVR Mild to moderate MR  CAD No anginal symptoms   Follow up in the AF clinic in 6 months.    Jorja Loa PA-C [facility] [address] [place],  [place] [address] [phone] 06/09/2023 9:23 AM

## 2023-06-26 ENCOUNTER — Encounter: Payer: Self-pay | Admitting: Cardiovascular Disease

## 2023-06-29 ENCOUNTER — Other Ambulatory Visit (HOSPITAL_COMMUNITY): Payer: Self-pay | Admitting: *Deleted

## 2023-06-29 ENCOUNTER — Other Ambulatory Visit (HOSPITAL_BASED_OUTPATIENT_CLINIC_OR_DEPARTMENT_OTHER): Payer: Self-pay

## 2023-06-29 ENCOUNTER — Other Ambulatory Visit: Payer: Self-pay

## 2023-06-29 ENCOUNTER — Other Ambulatory Visit (HOSPITAL_COMMUNITY): Payer: Self-pay

## 2023-06-29 MED ORDER — DOFETILIDE 500 MCG PO CAPS
500.0000 ug | ORAL_CAPSULE | Freq: Two times a day (BID) | ORAL | 1 refills | Status: DC
  Filled 2023-06-29 (×2): qty 180, 90d supply, fill #0
  Filled 2023-09-28: qty 180, 90d supply, fill #1

## 2023-07-01 ENCOUNTER — Encounter: Payer: Self-pay | Admitting: Cardiovascular Disease

## 2023-07-01 ENCOUNTER — Other Ambulatory Visit (HOSPITAL_COMMUNITY): Payer: Self-pay

## 2023-07-01 MED ORDER — DOXYCYCLINE HYCLATE 100 MG PO TABS
100.0000 mg | ORAL_TABLET | Freq: Two times a day (BID) | ORAL | 4 refills | Status: DC
  Filled 2023-07-01: qty 180, 90d supply, fill #0
  Filled 2023-09-28: qty 180, 90d supply, fill #1
  Filled 2023-12-12: qty 180, 90d supply, fill #2
  Filled 2024-01-05 – 2024-03-08 (×3): qty 180, 90d supply, fill #3

## 2023-07-01 MED FILL — Atorvastatin Calcium Tab 10 MG (Base Equivalent): ORAL | 84 days supply | Qty: 36 | Fill #0 | Status: AC

## 2023-07-02 ENCOUNTER — Other Ambulatory Visit: Payer: Self-pay

## 2023-07-02 ENCOUNTER — Other Ambulatory Visit (HOSPITAL_COMMUNITY): Payer: Self-pay

## 2023-07-03 ENCOUNTER — Other Ambulatory Visit (HOSPITAL_COMMUNITY): Payer: Self-pay

## 2023-07-06 DIAGNOSIS — M170 Bilateral primary osteoarthritis of knee: Secondary | ICD-10-CM | POA: Diagnosis not present

## 2023-07-06 DIAGNOSIS — I480 Paroxysmal atrial fibrillation: Secondary | ICD-10-CM | POA: Diagnosis not present

## 2023-07-06 DIAGNOSIS — N401 Enlarged prostate with lower urinary tract symptoms: Secondary | ICD-10-CM | POA: Diagnosis not present

## 2023-07-06 DIAGNOSIS — Z952 Presence of prosthetic heart valve: Secondary | ICD-10-CM | POA: Diagnosis not present

## 2023-07-18 ENCOUNTER — Other Ambulatory Visit (HOSPITAL_COMMUNITY): Payer: Self-pay

## 2023-07-19 DIAGNOSIS — M170 Bilateral primary osteoarthritis of knee: Secondary | ICD-10-CM | POA: Diagnosis not present

## 2023-07-19 DIAGNOSIS — I480 Paroxysmal atrial fibrillation: Secondary | ICD-10-CM | POA: Diagnosis not present

## 2023-07-19 DIAGNOSIS — Z952 Presence of prosthetic heart valve: Secondary | ICD-10-CM | POA: Diagnosis not present

## 2023-07-19 DIAGNOSIS — N401 Enlarged prostate with lower urinary tract symptoms: Secondary | ICD-10-CM | POA: Diagnosis not present

## 2023-07-20 ENCOUNTER — Other Ambulatory Visit (HOSPITAL_COMMUNITY): Payer: Self-pay | Admitting: *Deleted

## 2023-07-20 ENCOUNTER — Other Ambulatory Visit (HOSPITAL_COMMUNITY): Payer: Self-pay

## 2023-07-20 ENCOUNTER — Other Ambulatory Visit: Payer: Self-pay

## 2023-07-20 DIAGNOSIS — R739 Hyperglycemia, unspecified: Secondary | ICD-10-CM | POA: Diagnosis not present

## 2023-07-20 DIAGNOSIS — Z125 Encounter for screening for malignant neoplasm of prostate: Secondary | ICD-10-CM | POA: Diagnosis not present

## 2023-07-20 DIAGNOSIS — I480 Paroxysmal atrial fibrillation: Secondary | ICD-10-CM | POA: Diagnosis not present

## 2023-07-20 DIAGNOSIS — Z0000 Encounter for general adult medical examination without abnormal findings: Secondary | ICD-10-CM | POA: Diagnosis not present

## 2023-07-20 DIAGNOSIS — R413 Other amnesia: Secondary | ICD-10-CM | POA: Diagnosis not present

## 2023-07-20 DIAGNOSIS — H903 Sensorineural hearing loss, bilateral: Secondary | ICD-10-CM | POA: Diagnosis not present

## 2023-07-20 DIAGNOSIS — M170 Bilateral primary osteoarthritis of knee: Secondary | ICD-10-CM | POA: Diagnosis not present

## 2023-07-20 DIAGNOSIS — E538 Deficiency of other specified B group vitamins: Secondary | ICD-10-CM | POA: Diagnosis not present

## 2023-07-20 DIAGNOSIS — E7800 Pure hypercholesterolemia, unspecified: Secondary | ICD-10-CM | POA: Diagnosis not present

## 2023-07-20 DIAGNOSIS — I2510 Atherosclerotic heart disease of native coronary artery without angina pectoris: Secondary | ICD-10-CM | POA: Diagnosis not present

## 2023-07-20 DIAGNOSIS — E559 Vitamin D deficiency, unspecified: Secondary | ICD-10-CM | POA: Diagnosis not present

## 2023-07-20 DIAGNOSIS — N400 Enlarged prostate without lower urinary tract symptoms: Secondary | ICD-10-CM | POA: Diagnosis not present

## 2023-07-20 MED ORDER — APIXABAN 5 MG PO TABS
5.0000 mg | ORAL_TABLET | Freq: Two times a day (BID) | ORAL | 1 refills | Status: DC
  Filled 2023-07-20: qty 180, 90d supply, fill #0

## 2023-07-23 ENCOUNTER — Other Ambulatory Visit: Payer: Self-pay

## 2023-07-27 ENCOUNTER — Other Ambulatory Visit: Payer: Self-pay

## 2023-07-27 ENCOUNTER — Other Ambulatory Visit (HOSPITAL_COMMUNITY): Payer: Self-pay | Admitting: *Deleted

## 2023-07-27 ENCOUNTER — Encounter: Payer: Self-pay | Admitting: Pharmacist

## 2023-07-27 ENCOUNTER — Other Ambulatory Visit (HOSPITAL_COMMUNITY): Payer: Self-pay

## 2023-07-27 MED ORDER — APIXABAN 5 MG PO TABS
5.0000 mg | ORAL_TABLET | Freq: Two times a day (BID) | ORAL | 1 refills | Status: DC
  Filled 2023-07-27: qty 180, 90d supply, fill #0
  Filled 2023-10-21 – 2023-10-24 (×2): qty 180, 90d supply, fill #1

## 2023-07-28 ENCOUNTER — Other Ambulatory Visit: Payer: Self-pay

## 2023-08-20 DIAGNOSIS — Z952 Presence of prosthetic heart valve: Secondary | ICD-10-CM | POA: Diagnosis not present

## 2023-08-20 DIAGNOSIS — N401 Enlarged prostate with lower urinary tract symptoms: Secondary | ICD-10-CM | POA: Diagnosis not present

## 2023-08-20 DIAGNOSIS — M170 Bilateral primary osteoarthritis of knee: Secondary | ICD-10-CM | POA: Diagnosis not present

## 2023-08-20 DIAGNOSIS — I480 Paroxysmal atrial fibrillation: Secondary | ICD-10-CM | POA: Diagnosis not present

## 2023-08-24 ENCOUNTER — Other Ambulatory Visit (HOSPITAL_COMMUNITY): Payer: Self-pay

## 2023-08-24 ENCOUNTER — Other Ambulatory Visit: Payer: Self-pay

## 2023-08-24 MED FILL — Atorvastatin Calcium Tab 10 MG (Base Equivalent): ORAL | 84 days supply | Qty: 36 | Fill #1 | Status: CN

## 2023-09-19 ENCOUNTER — Other Ambulatory Visit (HOSPITAL_COMMUNITY): Payer: Self-pay

## 2023-09-19 MED FILL — Atorvastatin Calcium Tab 10 MG (Base Equivalent): ORAL | 84 days supply | Qty: 36 | Fill #1 | Status: AC

## 2023-09-21 DIAGNOSIS — E7849 Other hyperlipidemia: Secondary | ICD-10-CM | POA: Diagnosis not present

## 2023-09-21 DIAGNOSIS — Z952 Presence of prosthetic heart valve: Secondary | ICD-10-CM | POA: Diagnosis not present

## 2023-09-21 DIAGNOSIS — M25561 Pain in right knee: Secondary | ICD-10-CM | POA: Diagnosis not present

## 2023-09-21 DIAGNOSIS — M170 Bilateral primary osteoarthritis of knee: Secondary | ICD-10-CM | POA: Diagnosis not present

## 2023-09-21 DIAGNOSIS — N401 Enlarged prostate with lower urinary tract symptoms: Secondary | ICD-10-CM | POA: Diagnosis not present

## 2023-09-21 DIAGNOSIS — I480 Paroxysmal atrial fibrillation: Secondary | ICD-10-CM | POA: Diagnosis not present

## 2023-09-28 ENCOUNTER — Other Ambulatory Visit: Payer: Self-pay

## 2023-09-29 ENCOUNTER — Encounter: Payer: Self-pay | Admitting: Pharmacist

## 2023-09-29 ENCOUNTER — Other Ambulatory Visit: Payer: Self-pay

## 2023-09-29 ENCOUNTER — Other Ambulatory Visit (HOSPITAL_COMMUNITY): Payer: Self-pay

## 2023-10-02 ENCOUNTER — Other Ambulatory Visit: Payer: Self-pay

## 2023-10-13 DIAGNOSIS — L570 Actinic keratosis: Secondary | ICD-10-CM | POA: Diagnosis not present

## 2023-10-13 DIAGNOSIS — Z85828 Personal history of other malignant neoplasm of skin: Secondary | ICD-10-CM | POA: Diagnosis not present

## 2023-10-13 DIAGNOSIS — D229 Melanocytic nevi, unspecified: Secondary | ICD-10-CM | POA: Diagnosis not present

## 2023-10-15 DIAGNOSIS — E7849 Other hyperlipidemia: Secondary | ICD-10-CM | POA: Diagnosis not present

## 2023-10-15 DIAGNOSIS — I480 Paroxysmal atrial fibrillation: Secondary | ICD-10-CM | POA: Diagnosis not present

## 2023-10-15 DIAGNOSIS — M170 Bilateral primary osteoarthritis of knee: Secondary | ICD-10-CM | POA: Diagnosis not present

## 2023-10-21 ENCOUNTER — Other Ambulatory Visit: Payer: Self-pay

## 2023-10-21 ENCOUNTER — Other Ambulatory Visit (HOSPITAL_COMMUNITY): Payer: Self-pay

## 2023-10-22 DIAGNOSIS — I480 Paroxysmal atrial fibrillation: Secondary | ICD-10-CM | POA: Diagnosis not present

## 2023-10-22 DIAGNOSIS — E7849 Other hyperlipidemia: Secondary | ICD-10-CM | POA: Diagnosis not present

## 2023-10-22 DIAGNOSIS — M170 Bilateral primary osteoarthritis of knee: Secondary | ICD-10-CM | POA: Diagnosis not present

## 2023-10-24 ENCOUNTER — Other Ambulatory Visit: Payer: Self-pay

## 2023-10-24 ENCOUNTER — Other Ambulatory Visit (HOSPITAL_COMMUNITY): Payer: Self-pay

## 2023-10-26 ENCOUNTER — Other Ambulatory Visit: Payer: Self-pay

## 2023-10-26 ENCOUNTER — Other Ambulatory Visit (HOSPITAL_COMMUNITY): Payer: Self-pay

## 2023-10-29 ENCOUNTER — Ambulatory Visit (HOSPITAL_COMMUNITY)
Admission: RE | Admit: 2023-10-29 | Discharge: 2023-10-29 | Disposition: A | Discharge status: Home / Self Care | Payer: Medicare Other | Source: Ambulatory Visit | Attending: Physician Assistant | Admitting: Physician Assistant

## 2023-10-29 ENCOUNTER — Encounter (HOSPITAL_COMMUNITY): Payer: Self-pay | Admitting: Physician Assistant

## 2023-10-29 VITALS — BP 126/78 | HR 66 | Ht 72.0 in | Wt 187.2 lb

## 2023-10-29 DIAGNOSIS — I2510 Atherosclerotic heart disease of native coronary artery without angina pectoris: Secondary | ICD-10-CM | POA: Insufficient documentation

## 2023-10-29 DIAGNOSIS — I480 Paroxysmal atrial fibrillation: Secondary | ICD-10-CM | POA: Insufficient documentation

## 2023-10-29 DIAGNOSIS — Z7901 Long term (current) use of anticoagulants: Secondary | ICD-10-CM | POA: Insufficient documentation

## 2023-10-29 DIAGNOSIS — I4819 Other persistent atrial fibrillation: Secondary | ICD-10-CM | POA: Diagnosis present

## 2023-10-29 DIAGNOSIS — Z5181 Encounter for therapeutic drug level monitoring: Secondary | ICD-10-CM | POA: Insufficient documentation

## 2023-10-29 DIAGNOSIS — Z952 Presence of prosthetic heart valve: Secondary | ICD-10-CM | POA: Diagnosis not present

## 2023-10-29 DIAGNOSIS — I080 Rheumatic disorders of both mitral and aortic valves: Secondary | ICD-10-CM | POA: Diagnosis not present

## 2023-10-29 DIAGNOSIS — D6869 Other thrombophilia: Secondary | ICD-10-CM | POA: Insufficient documentation

## 2023-10-29 DIAGNOSIS — Z79899 Other long term (current) drug therapy: Secondary | ICD-10-CM | POA: Diagnosis not present

## 2023-10-29 LAB — CBC
HCT: 39.6 % (ref 39.0–52.0)
Hemoglobin: 13 g/dL (ref 13.0–17.0)
MCH: 30.1 pg (ref 26.0–34.0)
MCHC: 32.8 g/dL (ref 30.0–36.0)
MCV: 91.7 fL (ref 80.0–100.0)
Platelets: 110 10*3/uL — ABNORMAL LOW (ref 150–400)
RBC: 4.32 MIL/uL (ref 4.22–5.81)
RDW: 12.7 % (ref 11.5–15.5)
WBC: 5.2 10*3/uL (ref 4.0–10.5)
nRBC: 0 % (ref 0.0–0.2)

## 2023-10-29 LAB — BASIC METABOLIC PANEL
Anion gap: 10 (ref 5–15)
BUN: 21 mg/dL (ref 8–23)
CO2: 21 mmol/L — ABNORMAL LOW (ref 22–32)
Calcium: 9 mg/dL (ref 8.9–10.3)
Chloride: 104 mmol/L (ref 98–111)
Creatinine, Ser: 1.16 mg/dL (ref 0.61–1.24)
GFR, Estimated: >60 mL/min (ref >60)
Glucose, Bld: 97 mg/dL (ref 70–99)
Potassium: 3.7 mmol/L (ref 3.5–5.1)
Sodium: 135 mmol/L (ref 135–145)

## 2023-10-29 LAB — MAGNESIUM: Magnesium: 2 mg/dL (ref 1.7–2.4)

## 2023-10-29 NOTE — Progress Notes (Signed)
 Primary Care Physician: Noberto Retort, MD Primary Cardiologist: Dr Flora Lipps Primary Electrophysiologist: Dr Nelly Laurence Referring Physician: Dr Jasmine Pang is a 83 y.o. male with a history of severe aortic stenosis status post TAVR, persistent atrial fibrillation, CAD, hyperlipidemia who presents for follow up in the [facility]. Patient is on Eliquis for a CHADS2VASC score of 3. He was previously on metoprolol but had symptomatic bradycardia and this was discontinued. However, his afib burden increased. He was seen by Dr Nelly Laurence who recommended dofetilide vs ablation, patient opted for dofetilide. S/p dofetilide admission 12/5-12/8/23.  Patient returns for follow up for atrial fibrillation and dofetilide monitoring. He reports that he has done well since his last visit. He denies any interim symptoms of afib. No bleeding issues on anticoagulation.   Today, he denies symptoms of palpitations, chest pain, shortness of breath, orthopnea, PND, lower extremity edema, dizziness, presyncope, syncope, snoring, daytime somnolence, bleeding, or neurologic sequela. The patient is tolerating medications without difficulties and is otherwise without complaint today.    Atrial Fibrillation Risk Factors:  he does not have symptoms or diagnosis of sleep apnea. he does not have a history of rheumatic fever.   Atrial Fibrillation Management history:  Previous antiarrhythmic drugs: dofetilide  Previous cardioversions: none Previous ablations: none Anticoagulation history: Eliquis   Past Medical History:  Diagnosis Date   Cancer (HCC)    colon cancer and melanoma   Complication of anesthesia    difficult to wake up after surgery   Coronary artery disease    DJD (degenerative joint disease)    Dr. Thurston Hole end stages of DJD 2011   Hard of hearing    History of hiatal hernia    PAF (paroxysmal atrial fibrillation) (HCC)    S/P TAVR (transcatheter aortic  valve replacement) 02/25/2022   S3UR 26mm via TF apprach with Dr. Lynnette Caffey and Dr. Laneta Simmers   Severe aortic stenosis    Urinary frequency    UTI (lower urinary tract infection)     Current Outpatient Medications  Medication Sig Dispense Refill   acetaminophen (TYLENOL) 650 MG CR tablet Take 650 mg by mouth every 8 (eight) hours as needed for pain.     alfuzosin (UROXATRAL) 10 MG 24 hr tablet Take 1 tablet (10 mg total) by mouth at bedtime. 30 tablet 11   amoxicillin (AMOXIL) 500 MG capsule Take 2,000 mg by mouth See admin instructions. Take 2000 mg 1 hour prior to dental work     apixaban (ELIQUIS) 5 MG TABS tablet Take 1 tablet (5 mg total) by mouth 2 (two) times daily. 180 tablet 1   atorvastatin (LIPITOR) 10 MG tablet TAKE 1 TABLET ON MONDAY,WEDNESDAYS AND FRIDAYS 36 tablet 9   cholecalciferol (VITAMIN D3) 25 MCG (1000 UNIT) tablet Take 2,000 Units by mouth daily.     dofetilide (TIKOSYN) 500 MCG capsule Take 1 capsule (500 mcg total) by mouth 2 (two) times daily. 180 capsule 1   doxycycline (VIBRA-TABS) 100 MG tablet Take 1 tablet (100 mg total) by mouth 2 (two) times daily. 180 tablet 4   finasteride (PROSCAR) 5 MG tablet Take 1 tablet (5 mg total) by mouth daily at 12 noon. 90 tablet 3   saccharomyces boulardii (FLORASTOR) 250 MG capsule as directed Orally     valACYclovir (VALTREX) 500 MG tablet Take 500 mg by mouth 2 (two) times daily as needed (Cold sore). for 5 days at onset of cold sore  2   vitamin B-12 (  CYANOCOBALAMIN) 1000 MCG tablet Take 1,000 mcg by mouth daily.     No current facility-administered medications for this encounter.    ROS- All systems are reviewed and negative except as per the HPI above.  Physical Exam: Vitals:   10/29/23 0942  BP: 126/78  Pulse: 66  Weight: 84.9 kg  Height: 6' (1.829 m)    GEN: Well nourished, well developed in no acute distress CARDIAC: Regular rate and rhythm, no rubs, gallops, 2/6 systolic murmur  RESPIRATORY:  Clear to  auscultation without rales, wheezing or rhonchi  ABDOMEN: Soft, non-tender, non-distended EXTREMITIES:  No edema; No deformity    Wt Readings from Last 3 Encounters:  10/29/23 84.9 kg  06/09/23 83.3 kg  06/04/23 83.3 kg    EKG today demonstrates  SR, PACs Vent. rate 66 BPM PR interval 112 ms QRS duration 96 ms QT/QTcB 434/454 ms   Echo 02/23/23 demonstrated   1. Left ventricular ejection fraction, by estimation, is 60 to 65%. The  left ventricle has normal function. The left ventricle has no regional  wall motion abnormalities. There is mild concentric left ventricular  hypertrophy. Left ventricular diastolic parameters are consistent with Grade II diastolic dysfunction (pseudonormalization). Elevated left ventricular end-diastolic pressure.   2. Right ventricular systolic function is normal. The right ventricular  size is normal. There is mildly elevated pulmonary artery systolic  pressure.   3. Left atrial size was mildly dilated.   4. The mitral valve is normal in structure. Mild to moderate mitral valve  regurgitation. No evidence of mitral stenosis.   5. Mild perivalvular regurgitation. The aortic valve has been  repaired/replaced. Aortic valve regurgitation is mild. No aortic stenosis  is present. There is a 26 mm Sapien prosthetic (TAVR) valve present in the  aortic position. Procedure Date:  02/25/2022. Aortic regurgitation PHT measures 397 msec. Aortic valve area,  by VTI measures 1.11 cm. Aortic valve mean gradient measures 13.0 mmHg.  Aortic valve Vmax measures 2.32 m/s.   6. The inferior vena cava is normal in size with greater than 50%  respiratory variability, suggesting right atrial pressure of 3 mmHg.   Epic records are reviewed at length today  CHA2DS2-VASc Score = 3  The patient's score is based upon: CHF History: 0 HTN History: 0 Diabetes History: 0 Stroke History: 0 Vascular Disease History: 1 Age Score: 2 Gender Score: 0       ASSESSMENT AND  PLAN: Paroxysmal Atrial Fibrillation/atrial tach The patient's CHA2DS2-VASc score is 3, indicating a 3.2% annual risk of stroke.   S/p dofetilide admission 07/2022 Patient appears to be maintaining SR Continue dofetilide 500 mcg BID Continue Eliquis 5 mg BID Check cbc today.  Off AV nodal agents with h/o symptomatic bradycardia  Secondary Hypercoagulable State (ICD10:  D68.69) The patient is at significant risk for stroke/thromboembolism based upon his CHA2DS2-VASc Score of 3.  Continue Apixaban (Eliquis). No bleeding issues.   High Risk Medication Monitoring (ICD 10: Z79.899) QT interval on ECG acceptable for dofetilide monitoring. Check bmet/mag today.      VHD Severe AS s/p TAVR Mild to moderate MR Followed by Dr Flora Lipps  CAD No anginal symptoms Followed by Dr Flora Lipps   Follow up in the AF clinic in 6 months.    Jorja Loa PA-C [facility] [address] [place], [place] [address] [phone] 10/29/2023 10:20 AM

## 2023-10-30 ENCOUNTER — Other Ambulatory Visit (HOSPITAL_COMMUNITY): Payer: Self-pay

## 2023-10-30 MED ORDER — POTASSIUM CHLORIDE CRYS ER 10 MEQ PO TBCR: 10.0000 meq | EXTENDED_RELEASE_TABLET | Freq: Every day | ORAL | 1 refills | Status: DC

## 2023-11-02 DIAGNOSIS — H2513 Age-related nuclear cataract, bilateral: Secondary | ICD-10-CM | POA: Diagnosis not present

## 2023-11-02 DIAGNOSIS — D3132 Benign neoplasm of left choroid: Secondary | ICD-10-CM | POA: Diagnosis not present

## 2023-11-03 DIAGNOSIS — Z96651 Presence of right artificial knee joint: Secondary | ICD-10-CM | POA: Diagnosis not present

## 2023-11-03 DIAGNOSIS — M1711 Unilateral primary osteoarthritis, right knee: Secondary | ICD-10-CM | POA: Diagnosis not present

## 2023-11-03 DIAGNOSIS — T8484XD Pain due to internal orthopedic prosthetic devices, implants and grafts, subsequent encounter: Secondary | ICD-10-CM | POA: Diagnosis not present

## 2023-11-11 ENCOUNTER — Ambulatory Visit (HOSPITAL_COMMUNITY): Payer: Medicare Other | Admitting: Physician Assistant

## 2023-11-13 ENCOUNTER — Ambulatory Visit (HOSPITAL_COMMUNITY)
Admission: RE | Admit: 2023-11-13 | Discharge: 2023-11-13 | Disposition: A | Discharge status: Home / Self Care | Source: Ambulatory Visit | Attending: Physician Assistant | Admitting: Physician Assistant

## 2023-11-13 DIAGNOSIS — I4891 Unspecified atrial fibrillation: Secondary | ICD-10-CM | POA: Diagnosis not present

## 2023-11-13 LAB — BASIC METABOLIC PANEL WITH GFR
Anion gap: 10 (ref 5–15)
BUN: 20 mg/dL (ref 8–23)
CO2: 24 mmol/L (ref 22–32)
Calcium: 9.8 mg/dL (ref 8.9–10.3)
Chloride: 106 mmol/L (ref 98–111)
Creatinine, Ser: 1.03 mg/dL (ref 0.61–1.24)
GFR, Estimated: >60 mL/min (ref >60)
Glucose, Bld: 100 mg/dL — ABNORMAL HIGH (ref 70–99)
Potassium: 5.3 mmol/L — ABNORMAL HIGH (ref 3.5–5.1)
Sodium: 140 mmol/L (ref 135–145)

## 2023-11-13 NOTE — Addendum Note (Signed)
 Encounter addended by: Shona Simpson, RN on: 11/13/2023 2:44 PM  Actions taken: Order list changed, Diagnosis association updated

## 2023-11-13 NOTE — Addendum Note (Signed)
 Encounter addended by: Shona Simpson, RN on: 11/13/2023 1:44 PM  Actions taken: Order list changed, Medication long-term status modified

## 2023-11-17 ENCOUNTER — Other Ambulatory Visit (HOSPITAL_COMMUNITY): Admitting: Physician Assistant

## 2023-11-20 ENCOUNTER — Other Ambulatory Visit (HOSPITAL_COMMUNITY): Payer: Self-pay

## 2023-11-20 ENCOUNTER — Other Ambulatory Visit (HOSPITAL_COMMUNITY): Payer: Self-pay | Admitting: Physician Assistant

## 2023-11-20 MED ORDER — DOFETILIDE 500 MCG PO CAPS
500.0000 ug | ORAL_CAPSULE | Freq: Two times a day (BID) | ORAL | 1 refills | Status: DC
  Filled 2023-11-20 – 2023-12-10 (×2): qty 180, 90d supply, fill #0
  Filled 2023-12-12 – 2024-03-08 (×4): qty 180, 90d supply, fill #1

## 2023-11-23 DIAGNOSIS — E7849 Other hyperlipidemia: Secondary | ICD-10-CM | POA: Diagnosis not present

## 2023-11-23 DIAGNOSIS — I480 Paroxysmal atrial fibrillation: Secondary | ICD-10-CM | POA: Diagnosis not present

## 2023-11-23 DIAGNOSIS — M170 Bilateral primary osteoarthritis of knee: Secondary | ICD-10-CM | POA: Diagnosis not present

## 2023-12-08 NOTE — Progress Notes (Unsigned)
 Cardiology Office Note:  .   Date:  12/09/2023  ID:  Herbert Loh, DOB 1940/09/19, MRN [id number] PCP: Roselind Congo, MD  [facility] Providers Cardiologist:  Oneil Bigness, MD Electrophysiologist:  Efraim Grange, MD { History of Present Illness: .    Chief Complaint  Patient presents with   Leg Pain    Gerald Spears is a 83 y.o. male with history of AS s/p TAVR, Afib on tikosyn , CAD, HLD who presents for follow-up.    History of Present Illness   Gerald Spears is an 83 year old male with persistent atrial fibrillation and aortic stenosis status post TAVR, CAD who presents for follow-up.  He has persistent atrial fibrillation managed with Tikosyn  and Eliquis , with no recent episodes. He takes Eliquis  5 mg twice daily for anticoagulation.  He experiences swelling in his right leg, which underwent knee revision surgery. The swelling is persistent, worsens by the end of the day, and causes increased knee pain. No specific cause was identified by the orthopedic doctor. He is not on diuretics.  No chest pain or trouble breathing. He reports occasional dizziness, which has improved with increased water  intake and electrolyte beverages. Dizziness is no longer a daily occurrence.  He is physically active, averaging 6,500 to 8,500 steps per day around the house. He is considering returning to playing golf. His blood pressure is reported as good, and he denies significant lightheadedness. His kidney function and potassium levels are reported as fine.          Problem List 1. Severe aortic stenosis -26 mm S3 TAVR 02/25/2022 -mild PVL 2. Mitral regurgitation, moderate 3. CAD -LHC 09/12/2020: D2 80% 4. HLD -T chol 173, HDL 65,  5. PACs -5.8% burden -brief ectopic atrial tachycardia  6. Atrial fibrillation, persistent 02/19/2021 -converted to NSR in ER -CHADSVASC= 3 (age, CAD) -On Tikosyn  7.  Mild cognitive impairment    ROS: All other ROS reviewed and  negative. Pertinent positives noted in the HPI.     Studies Reviewed: Aaron Aas   EKG Interpretation Date/Time:  Wednesday December 09 2023 08:47:16 EDT Ventricular Rate:  59 PR Interval:  126 QRS Duration:  102 QT Interval:  478 QTC Calculation: 473 R Axis:   -34  Text Interpretation: Sinus bradycardia with marked sinus arrhythmia Left axis deviation Minimal voltage criteria for LVH, may be normal variant ( R in aVL ) Confirmed by Jackquelyn Mass [phone]) on 12/09/2023 9:09:27 AM   TTE 02/23/2023  1. Left ventricular ejection fraction, by estimation, is 60 to 65%. The  left ventricle has normal function. The left ventricle has no regional  wall motion abnormalities. There is mild concentric left ventricular  hypertrophy. Left ventricular diastolic  parameters are consistent with Grade II diastolic dysfunction  (pseudonormalization). Elevated left ventricular end-diastolic pressure.   2. Right ventricular systolic function is normal. The right ventricular  size is normal. There is mildly elevated pulmonary artery systolic  pressure.   3. Left atrial size was mildly dilated.   4. The mitral valve is normal in structure. Mild to moderate mitral valve  regurgitation. No evidence of mitral stenosis.   5. Mild perivalvular regurgitation. The aortic valve has been  repaired/replaced. Aortic valve regurgitation is mild. No aortic stenosis  is present. There is a 26 mm Sapien prosthetic (TAVR) valve present in the  aortic position. Procedure Date:  02/25/2022. Aortic regurgitation PHT measures 397 msec. Aortic valve area,  by VTI measures 1.11 cm. Aortic valve  mean gradient measures 13.0 mmHg.  Aortic valve Vmax measures 2.32 m/s.   6. The inferior vena cava is normal in size with greater than 50%  respiratory variability, suggesting right atrial pressure of 3 mmHg.  Physical Exam:   VS:  BP 110/70 (BP Location: Left Arm, Patient Position: Sitting)   Pulse (!) 59   Ht 6' (1.829 m)   Wt 187 lb 6.4 oz  (85 kg)   SpO2 99%   BMI 25.42 kg/m    Wt Readings from Last 3 Encounters:  12/09/23 187 lb 6.4 oz (85 kg)  10/29/23 187 lb 3.2 oz (84.9 kg)  06/09/23 183 lb 9.6 oz (83.3 kg)    GEN: Well nourished, well developed in no acute distress NECK: No JVD; No carotid bruits CARDIAC: RRR, no murmurs, rubs, gallops RESPIRATORY:  Clear to auscultation without rales, wheezing or rhonchi  ABDOMEN: Soft, non-tender, non-distended EXTREMITIES:  2+ pitting edema in RLE ASSESSMENT AND PLAN: .   Assessment and Plan    Coronary artery disease with 90% diagonal lesion Managed with Lipitor 10 mg on Mondays, Wednesdays, and Fridays. LDL is 92, ok for now.  - Continue Lipitor 10 mg on Mondays, Wednesdays, and Fridays.  Persistent atrial fibrillation Well-controlled with Tikosyn . No recent episodes and no significant bradycardia. On Eliquis  5 mg BID for anticoagulation, appropriate for his condition. - Continue Tikosyn  for atrial fibrillation control. - Refill Eliquis  5 mg BID.  Aortic stenosis status post TAVR Stable gradients and overall well post-procedure. - no ASA as on eliquis . SBE ppx prescribed.   Right lower extremity edema Possibly related to previous knee surgery. Poor pulses noted. Differential includes vascular insufficiency, unlikely to be a blood clot due to Eliquis  use. Swelling may be exacerbated by fluid retention or previous surgeries. - Order arterial duplex ultrasound of the lower extremity to assess circulation. - Prescribe Lasix  20 mg as needed for edema management. - Recommend use of compression stockings to reduce swelling. - Advise elevating the leg to reduce swelling.              Follow-up: Return in about 4 months (around 04/09/2024).  Signed, Gigi Kyle. Rolm Clos, MD, [facility]  [facility]  [address] [place], [place] [address] [phone]  10:18 AM

## 2023-12-09 ENCOUNTER — Other Ambulatory Visit: Payer: Self-pay | Admitting: Cardiovascular Disease

## 2023-12-09 ENCOUNTER — Ambulatory Visit (HOSPITAL_COMMUNITY): Payer: Medicare Other | Admitting: Physician Assistant

## 2023-12-09 ENCOUNTER — Other Ambulatory Visit (HOSPITAL_COMMUNITY): Payer: Self-pay

## 2023-12-09 ENCOUNTER — Other Ambulatory Visit: Payer: Self-pay

## 2023-12-09 ENCOUNTER — Encounter: Payer: Self-pay | Admitting: Cardiovascular Disease

## 2023-12-09 ENCOUNTER — Ambulatory Visit: Payer: Medicare Other | Attending: Cardiovascular Disease | Admitting: Cardiovascular Disease

## 2023-12-09 VITALS — BP 110/70 | HR 59 | Ht 72.0 in | Wt 187.4 lb

## 2023-12-09 DIAGNOSIS — I4891 Unspecified atrial fibrillation: Secondary | ICD-10-CM | POA: Diagnosis not present

## 2023-12-09 DIAGNOSIS — R600 Localized edema: Secondary | ICD-10-CM | POA: Diagnosis not present

## 2023-12-09 DIAGNOSIS — I350 Nonrheumatic aortic (valve) stenosis: Secondary | ICD-10-CM | POA: Insufficient documentation

## 2023-12-09 DIAGNOSIS — D6869 Other thrombophilia: Secondary | ICD-10-CM | POA: Insufficient documentation

## 2023-12-09 DIAGNOSIS — E782 Mixed hyperlipidemia: Secondary | ICD-10-CM | POA: Diagnosis not present

## 2023-12-09 DIAGNOSIS — I25119 Atherosclerotic heart disease of native coronary artery with unspecified angina pectoris: Secondary | ICD-10-CM | POA: Diagnosis not present

## 2023-12-09 DIAGNOSIS — I480 Paroxysmal atrial fibrillation: Secondary | ICD-10-CM | POA: Diagnosis not present

## 2023-12-09 DIAGNOSIS — I340 Nonrheumatic mitral (valve) insufficiency: Secondary | ICD-10-CM | POA: Diagnosis not present

## 2023-12-09 LAB — BASIC METABOLIC PANEL WITH GFR
BUN/Creatinine Ratio: 22 (ref 10–24)
BUN: 22 mg/dL (ref 8–27)
CO2: 22 mmol/L (ref 20–29)
Calcium: 9.5 mg/dL (ref 8.6–10.2)
Chloride: 103 mmol/L (ref 96–106)
Creatinine, Ser: 1.01 mg/dL (ref 0.76–1.27)
Glucose: 87 mg/dL (ref 70–99)
Potassium: 5 mmol/L (ref 3.5–5.2)
Sodium: 137 mmol/L (ref 134–144)
eGFR: 74 mL/min/{1.73_m2} (ref >59)

## 2023-12-09 MED ORDER — FUROSEMIDE 20 MG PO TABS
20.0000 mg | ORAL_TABLET | ORAL | 3 refills | Status: DC | PRN
  Filled 2023-12-09: qty 90, fill #0

## 2023-12-09 MED ORDER — FUROSEMIDE 20 MG PO TABS: 20.0000 mg | ORAL_TABLET | ORAL | 3 refills | Status: DC | PRN

## 2023-12-09 NOTE — Patient Instructions (Addendum)
 Medication Instructions:  - START LASIX  20 MG AS NEEDED FOR SWELLING OR WEIGHT GAIN OF 3 POUNDS OVERNIGHT OR 5 POUNDS IN ONE WEEK.    *If you need a refill on your cardiac medications before your next appointment, please call your pharmacy*   Lab Work: None    If you have labs (blood work) drawn today and your tests are completely normal, you will receive your results only by: MyChart Message (if you have MyChart) OR A paper copy in the mail If you have any lab test that is abnormal or we need to change your treatment, we will call you to review the results.   Testing/Procedures: Your physician has requested that you have a lower or upper extremity arterial duplex. This test is an ultrasound of the arteries in the legs or arms. It looks at arterial blood flow in the legs and arms. Allow one hour for Lower and Upper Arterial scans. There are no restrictions or special instructions.  Please note: We ask at that you not bring children with you during ultrasound (echo/ vascular) testing. Due to room size and safety concerns, children are not allowed in the ultrasound rooms during exams. Our front office staff cannot provide observation of children in our lobby area while testing is being conducted. An adult accompanying a patient to their appointment will only be allowed in the ultrasound room at the discretion of the ultrasound technician under special circumstances. We apologize for any inconvenience.    Follow-Up: At [facility], you and your health needs are our priority.  As part of our continuing mission to provide you with exceptional heart care, we have created designated Provider Care Teams.  These Care Teams include your primary Cardiologist (physician) and Advanced Practice Providers (APPs -  Physician Assistants and Nurse Practitioners) who all work together to provide you with the care you need, when you need it.  We recommend signing up for the patient portal called "MyChart".   Sign up information is provided on this After Visit Summary.  MyChart is used to connect with patients for Virtual Visits (Telemedicine).  Patients are able to view lab/test results, encounter notes, upcoming appointments, etc.  Non-urgent messages can be sent to your provider as well.   To learn more about what you can do with MyChart, go to [email or web address].    Your next appointment:   4 month(s)  The format for your next appointment:   In Person  Provider:   Oneil Bigness, MD    Other Instructions

## 2023-12-10 ENCOUNTER — Other Ambulatory Visit: Payer: Self-pay

## 2023-12-10 ENCOUNTER — Other Ambulatory Visit (HOSPITAL_COMMUNITY): Payer: Self-pay

## 2023-12-10 MED ORDER — FUROSEMIDE 20 MG PO TABS
20.0000 mg | ORAL_TABLET | ORAL | 3 refills | Status: DC | PRN
  Filled 2023-12-10: qty 90, fill #0

## 2023-12-11 ENCOUNTER — Other Ambulatory Visit (HOSPITAL_COMMUNITY): Payer: Self-pay

## 2023-12-11 ENCOUNTER — Telehealth: Payer: Self-pay | Admitting: Cardiovascular Disease

## 2023-12-11 MED ORDER — FUROSEMIDE 20 MG PO TABS
20.0000 mg | ORAL_TABLET | Freq: Every day | ORAL | 3 refills | Status: AC | PRN
  Filled 2023-12-11 – 2024-08-25 (×2): qty 90, 90d supply, fill #0

## 2023-12-11 NOTE — Telephone Encounter (Signed)
 Pt's Rx was resent to pt's pharmacy with correction. Confirmation received.

## 2023-12-11 NOTE — Telephone Encounter (Signed)
*  STAT* If patient is at the pharmacy, call can be transferred to refill team.   1. Which medications need to be refilled? (please list name of each medication and dose if known)  Furosemide - prescription says as needed, must have the maxium that he can take per day   2. Would you like to learn more about the convenience, safety, & potential cost savings by using the [facility]?     3. Are you open to using the [facility] (Type [facility].    4. Which pharmacy/location (including street and city if local pharmacy) is medication to be sent to? [facility] out pt [employer]   5. Do they need a 30 day or 90 day supply?  Need to verify amount per refills and how many days

## 2023-12-12 ENCOUNTER — Other Ambulatory Visit (HOSPITAL_COMMUNITY): Payer: Self-pay

## 2023-12-12 MED FILL — Atorvastatin Calcium Tab 10 MG (Base Equivalent): ORAL | 84 days supply | Qty: 36 | Fill #2 | Status: AC

## 2023-12-14 ENCOUNTER — Other Ambulatory Visit: Payer: Self-pay | Admitting: Cardiovascular Disease

## 2023-12-14 DIAGNOSIS — R600 Localized edema: Secondary | ICD-10-CM

## 2023-12-15 ENCOUNTER — Other Ambulatory Visit: Payer: Self-pay

## 2023-12-24 DIAGNOSIS — E7849 Other hyperlipidemia: Secondary | ICD-10-CM | POA: Diagnosis not present

## 2023-12-24 DIAGNOSIS — M170 Bilateral primary osteoarthritis of knee: Secondary | ICD-10-CM | POA: Diagnosis not present

## 2023-12-24 DIAGNOSIS — I480 Paroxysmal atrial fibrillation: Secondary | ICD-10-CM | POA: Diagnosis not present

## 2023-12-31 ENCOUNTER — Encounter (HOSPITAL_COMMUNITY)

## 2024-01-05 ENCOUNTER — Other Ambulatory Visit (HOSPITAL_COMMUNITY): Payer: Self-pay | Admitting: Physician Assistant

## 2024-01-05 ENCOUNTER — Other Ambulatory Visit: Payer: Self-pay

## 2024-01-05 ENCOUNTER — Other Ambulatory Visit (HOSPITAL_COMMUNITY): Payer: Self-pay

## 2024-01-05 MED ORDER — APIXABAN 5 MG PO TABS
5.0000 mg | ORAL_TABLET | Freq: Two times a day (BID) | ORAL | 3 refills | Status: AC
  Filled 2024-01-05 – 2024-01-16 (×2): qty 180, 90d supply, fill #0
  Filled 2024-04-19: qty 180, 90d supply, fill #1
  Filled 2024-07-18: qty 180, 90d supply, fill #2

## 2024-01-05 MED FILL — Atorvastatin Calcium Tab 10 MG (Base Equivalent): ORAL | 84 days supply | Qty: 36 | Fill #3 | Status: CN

## 2024-01-06 ENCOUNTER — Encounter (HOSPITAL_COMMUNITY): Payer: Self-pay

## 2024-01-06 ENCOUNTER — Ambulatory Visit (HOSPITAL_COMMUNITY)
Admission: RE | Admit: 2024-01-06 | Discharge: 2024-01-06 | Disposition: A | Discharge status: Home / Self Care | Source: Ambulatory Visit | Attending: Cardiovascular Disease | Admitting: Cardiovascular Disease

## 2024-01-06 ENCOUNTER — Ambulatory Visit (HOSPITAL_COMMUNITY): Admission: RE | Admit: 2024-01-06 | Source: Ambulatory Visit

## 2024-01-06 DIAGNOSIS — R600 Localized edema: Secondary | ICD-10-CM | POA: Insufficient documentation

## 2024-01-07 ENCOUNTER — Ambulatory Visit: Payer: Self-pay | Admitting: Cardiovascular Disease

## 2024-01-07 LAB — VAS US ABI WITH/WO TBI
Left ABI: 1.4
Right ABI: 1.14

## 2024-01-16 ENCOUNTER — Other Ambulatory Visit (HOSPITAL_BASED_OUTPATIENT_CLINIC_OR_DEPARTMENT_OTHER): Payer: Self-pay

## 2024-01-16 ENCOUNTER — Other Ambulatory Visit (HOSPITAL_COMMUNITY): Payer: Self-pay

## 2024-01-18 ENCOUNTER — Other Ambulatory Visit: Payer: Self-pay

## 2024-01-18 DIAGNOSIS — M47816 Spondylosis without myelopathy or radiculopathy, lumbar region: Secondary | ICD-10-CM | POA: Diagnosis not present

## 2024-01-25 DIAGNOSIS — E7849 Other hyperlipidemia: Secondary | ICD-10-CM | POA: Diagnosis not present

## 2024-01-25 DIAGNOSIS — I480 Paroxysmal atrial fibrillation: Secondary | ICD-10-CM | POA: Diagnosis not present

## 2024-01-25 DIAGNOSIS — M170 Bilateral primary osteoarthritis of knee: Secondary | ICD-10-CM | POA: Diagnosis not present

## 2024-01-28 ENCOUNTER — Other Ambulatory Visit: Payer: Self-pay

## 2024-01-28 ENCOUNTER — Other Ambulatory Visit (HOSPITAL_COMMUNITY): Payer: Self-pay

## 2024-01-28 DIAGNOSIS — M170 Bilateral primary osteoarthritis of knee: Secondary | ICD-10-CM | POA: Diagnosis not present

## 2024-01-28 DIAGNOSIS — E7849 Other hyperlipidemia: Secondary | ICD-10-CM | POA: Diagnosis not present

## 2024-01-28 DIAGNOSIS — I480 Paroxysmal atrial fibrillation: Secondary | ICD-10-CM | POA: Diagnosis not present

## 2024-01-28 MED FILL — Atorvastatin Calcium Tab 10 MG (Base Equivalent): ORAL | 84 days supply | Qty: 36 | Fill #3 | Status: CN

## 2024-02-22 ENCOUNTER — Ambulatory Visit: Attending: Cardiovascular Disease | Admitting: Cardiovascular Disease

## 2024-02-22 ENCOUNTER — Encounter: Payer: Self-pay | Admitting: Cardiovascular Disease

## 2024-02-22 VITALS — BP 110/74 | HR 69 | Ht 72.0 in | Wt 181.2 lb

## 2024-02-22 DIAGNOSIS — R001 Bradycardia, unspecified: Secondary | ICD-10-CM | POA: Insufficient documentation

## 2024-02-22 DIAGNOSIS — I4819 Other persistent atrial fibrillation: Secondary | ICD-10-CM | POA: Insufficient documentation

## 2024-02-22 NOTE — Patient Instructions (Signed)

## 2024-02-22 NOTE — Progress Notes (Signed)
 Cardiology Office Note:    Date:  02/22/2024   ID:  Gerald Spears, DOB [birth year], MRN [id number]  PCP:  Arloa Elsie SAUNDERS, MD   [facility] Providers Cardiologist:  Darryle ONEIDA Decent, MD Electrophysiologist:  Eulas FORBES Furbish, MD     Referring MD: Arloa Elsie SAUNDERS, MD   Chief complaint: AM dizziness with standing  History of Present Illness:    Gerald Spears is a 83 y.o. male with a hx of aortic stenosis s/p TAVR, atrial fibrillation which has been persistent in the past, referred by dr. Decent for management of bradycardia and atrial arrhythmia.  The patient complained of dizziness while taking metoprolol  in the past. In clinic, his ECG showed sinus bradycardia at 50 bpm. The dizziness improved when he discontinued metoprolol .  A monitor was placed that showed an average heart rate of 65 bpm. He did have several episodes of AT and AF with uncontrolled rates.  His Apple Watch detected several episodes of A-fib after he discontinued metoprolol .  These episodes occurred while he was wearing the monitor correlated with monitor-detected atrial fib episodes.  His Apple Watch has not detected any A-fib since September 22.  He reports today that he feels much better than he did when taking metoprolol .  He does have some dizziness in the morning that occurs primarily after standing up and resolves after short period time.    Today, his wife notes that he has been slowing down and has less energy than he has in the past.   EKGs/Labs/Other Studies Reviewed:     14 day monitor: Patient had a min HR of 44 bpm (sinus bradycardia), max HR of 219 bpm (atrial fibrillation), and avg HR of 65 bpm (normal sinus rhythm). Predominant underlying rhythm was Sinus Rhythm. 46 Supraventricular Tachycardia runs occurred, the run with the fastest interval lasting 7 beats with a max rate of 207 bpm, the longest lasting 16 beats with an avg rate of 105 bpm. Atrial Fibrillation occurred (2%  burden), ranging from 93-219 bpm (avg of 147 bpm), the longest lasting 6 hours 31 mins with an avg rate of 147 bpm. Supraventricular Tachycardia was detected within +/- 45 seconds of symptomatic patient event(s). Isolated SVEs were frequent (10.4%, [id number]), SVE Couplets were rare (<1.0%, 3388), and SVE Triplets were rare (<1.0%, 466). Isolated VEs were rare (<1.0%, 1484), VE Couplets were rare (<1.0%, 2), and VE Triplets were rare (<1.0%, 2). Triggered events correlated with sinus rhythm with PVCs   Impression: Paroxysmal Afib detected (2% burden, max HR 219). SVT detected (likely atrial tachycardia). Frequent PACs (10.4% burden).   TTE 03/26/22 EF 55%. Mild LVH. Left atrium is mildly dilated  EKG:  Last EKG results: Sinus rhythm with frequent PACs. HR 73 bpm   Recent Labs: 10/29/2023: Hemoglobin 13.0; Magnesium  2.0; Platelets 110 12/09/2023: BUN 22; Creatinine, Ser 1.01; Potassium 5.0; Sodium 137    Risk Assessment/Calculations:    CHA2DS2-VASc Score = 3   This indicates a 3.2% annual risk of stroke. The patient's score is based upon: CHF History: 0 HTN History: 0 Diabetes History: 0 Stroke History: 0 Vascular Disease History: 1 Age Score: 2 Gender Score: 0         Physical Exam:    VS:  BP 110/74 (BP Location: Right Arm, Patient Position: Sitting, Cuff Size: Normal)   Pulse 69   Ht 6' (1.829 m)   Wt 181 lb 3.2 oz (82.2 kg)   SpO2 97%   BMI 24.58 kg/m  Wt Readings from Last 3 Encounters:  02/22/24 181 lb 3.2 oz (82.2 kg)  12/09/23 187 lb 6.4 oz (85 kg)  10/29/23 187 lb 3.2 oz (84.9 kg)     GEN:  Well nourished, well developed in no acute distress CARDIAC: RRR, no murmurs, rubs, gallops RESPIRATORY:  Normal work of breathing MUSCULOSKELETAL: no edema    ASSESSMENT & PLAN:    Sinus bradycardia:  Heart rate 69 clinic today Off AV nodal blocking medications  Paroxysmal atrial fibrillation and atrial tachycardia:  S/p Tikosyn  load and maintaining sinus  rhythm.  ECG today is good.  Recent labs (April, 2025) with normal creatinine.   S/p TAVR: valve functioning normally      Follow-up in A-fib clinic in 6 months.   Medication Adjustments/Labs and Tests Ordered: Current medicines are reviewed at length with the patient today.  Concerns regarding medicines are outlined above.  Orders Placed This Encounter  Procedures   EKG 12-Lead   No orders of the defined types were placed in this encounter.    Signed, Eulas FORBES Furbish, MD  02/22/2024 11:05 AM    [facility]

## 2024-03-08 ENCOUNTER — Other Ambulatory Visit: Payer: Self-pay

## 2024-03-08 ENCOUNTER — Other Ambulatory Visit (HOSPITAL_COMMUNITY): Payer: Self-pay

## 2024-03-08 DIAGNOSIS — E7849 Other hyperlipidemia: Secondary | ICD-10-CM | POA: Diagnosis not present

## 2024-03-08 DIAGNOSIS — M170 Bilateral primary osteoarthritis of knee: Secondary | ICD-10-CM | POA: Diagnosis not present

## 2024-03-08 DIAGNOSIS — I480 Paroxysmal atrial fibrillation: Secondary | ICD-10-CM | POA: Diagnosis not present

## 2024-03-08 MED FILL — Atorvastatin Calcium Tab 10 MG (Base Equivalent): ORAL | 84 days supply | Qty: 36 | Fill #3 | Status: AC

## 2024-04-04 ENCOUNTER — Other Ambulatory Visit (HOSPITAL_COMMUNITY): Payer: Self-pay

## 2024-04-09 DIAGNOSIS — I480 Paroxysmal atrial fibrillation: Secondary | ICD-10-CM | POA: Diagnosis not present

## 2024-04-09 DIAGNOSIS — M170 Bilateral primary osteoarthritis of knee: Secondary | ICD-10-CM | POA: Diagnosis not present

## 2024-04-09 DIAGNOSIS — E7849 Other hyperlipidemia: Secondary | ICD-10-CM | POA: Diagnosis not present

## 2024-04-19 ENCOUNTER — Other Ambulatory Visit (HOSPITAL_COMMUNITY): Payer: Self-pay

## 2024-04-19 ENCOUNTER — Other Ambulatory Visit: Payer: Self-pay

## 2024-04-19 DIAGNOSIS — L570 Actinic keratosis: Secondary | ICD-10-CM | POA: Diagnosis not present

## 2024-04-19 MED ORDER — FLUOROURACIL 5 % EX CREA
1.0000 | TOPICAL_CREAM | Freq: Two times a day (BID) | CUTANEOUS | 0 refills | Status: AC
  Filled 2024-04-19: qty 40, 20d supply, fill #0

## 2024-04-20 ENCOUNTER — Other Ambulatory Visit: Payer: Self-pay

## 2024-04-20 ENCOUNTER — Other Ambulatory Visit (HOSPITAL_COMMUNITY): Payer: Self-pay

## 2024-04-21 ENCOUNTER — Other Ambulatory Visit: Payer: Self-pay

## 2024-04-22 ENCOUNTER — Other Ambulatory Visit (HOSPITAL_COMMUNITY): Payer: Self-pay

## 2024-04-26 ENCOUNTER — Other Ambulatory Visit (HOSPITAL_COMMUNITY): Payer: Self-pay

## 2024-04-26 ENCOUNTER — Other Ambulatory Visit: Payer: Self-pay

## 2024-04-28 ENCOUNTER — Other Ambulatory Visit (HOSPITAL_COMMUNITY): Payer: Self-pay

## 2024-05-03 ENCOUNTER — Other Ambulatory Visit (HOSPITAL_COMMUNITY): Payer: Self-pay

## 2024-05-04 ENCOUNTER — Other Ambulatory Visit (HOSPITAL_COMMUNITY): Payer: Self-pay

## 2024-05-05 ENCOUNTER — Ambulatory Visit (HOSPITAL_COMMUNITY)
Admission: RE | Admit: 2024-05-05 | Discharge: 2024-05-05 | Disposition: A | Discharge status: Home / Self Care | Source: Ambulatory Visit | Attending: Physician Assistant | Admitting: Physician Assistant

## 2024-05-05 ENCOUNTER — Encounter (HOSPITAL_COMMUNITY): Payer: Self-pay | Admitting: Physician Assistant

## 2024-05-05 VITALS — BP 124/70 | HR 60 | Ht 72.0 in | Wt 190.4 lb

## 2024-05-05 DIAGNOSIS — Z79899 Other long term (current) drug therapy: Secondary | ICD-10-CM | POA: Insufficient documentation

## 2024-05-05 DIAGNOSIS — D6869 Other thrombophilia: Secondary | ICD-10-CM | POA: Diagnosis not present

## 2024-05-05 DIAGNOSIS — I480 Paroxysmal atrial fibrillation: Secondary | ICD-10-CM | POA: Insufficient documentation

## 2024-05-05 DIAGNOSIS — Z5181 Encounter for therapeutic drug level monitoring: Secondary | ICD-10-CM | POA: Insufficient documentation

## 2024-05-05 NOTE — Progress Notes (Signed)
 Primary Care Physician: Arloa Elsie SAUNDERS, MD Primary Cardiologist: Dr Barbaraann Primary Electrophysiologist: Dr Nancey Referring Physician: Dr Nancey Gerald Spears is a 83 y.o. male with a history of severe aortic stenosis status post TAVR, persistent atrial fibrillation, CAD, hyperlipidemia who presents for follow up in the [facility]. Patient is on Eliquis  for stroke prevention. He was previously on metoprolol  but had symptomatic bradycardia and this was discontinued. However, his afib burden increased. He was seen by Dr Nancey who recommended dofetilide  vs ablation, patient opted for dofetilide . S/p dofetilide  admission 12/5-12/8/23.  Patient returns for follow up for atrial fibrillation and dofetilide  monitoring. He reports that he has done well since his last visit with no interim symptoms of afib. No bleeding issues on anticoagulation.   Today, he  denies symptoms of palpitations, chest pain, shortness of breath, orthopnea, PND, lower extremity edema, dizziness, presyncope, syncope, snoring, daytime somnolence, bleeding, or neurologic sequela. The patient is tolerating medications without difficulties and is otherwise without complaint today.    Atrial Fibrillation Risk Factors:  he does not have symptoms or diagnosis of sleep apnea. he does not have a history of rheumatic fever.   Atrial Fibrillation Management history:  Previous antiarrhythmic drugs: dofetilide   Previous cardioversions: none Previous ablations: none Anticoagulation history: Eliquis    Past Medical History:  Diagnosis Date   Cancer (HCC)    colon cancer and melanoma   Complication of anesthesia    difficult to wake up after surgery   Coronary artery disease    DJD (degenerative joint disease)    Dr. Jane end stages of DJD 2011   Hard of hearing    History of hiatal hernia    PAF (paroxysmal atrial fibrillation) (HCC)    S/P TAVR (transcatheter aortic valve  replacement) 02/25/2022   S3UR 26mm via TF apprach with Dr. Wendel and Dr. Lucas   Severe aortic stenosis    Urinary frequency    UTI (lower urinary tract infection)     Current Outpatient Medications  Medication Sig Dispense Refill   acetaminophen  (TYLENOL ) 650 MG CR tablet Take 650 mg by mouth every 8 (eight) hours as needed for pain.     alfuzosin  (UROXATRAL ) 10 MG 24 hr tablet Take 1 tablet (10 mg total) by mouth at bedtime. 30 tablet 11   amoxicillin (AMOXIL) 500 MG capsule Take 2,000 mg by mouth See admin instructions. Take 2000 mg 1 hour prior to dental work     apixaban  (ELIQUIS ) 5 MG TABS tablet Take 1 tablet (5 mg total) by mouth 2 (two) times daily. 180 tablet 3   atorvastatin  (LIPITOR) 10 MG tablet TAKE 1 TABLET ON MONDAY,WEDNESDAYS AND FRIDAYS 36 tablet 9   cholecalciferol (VITAMIN D3) 25 MCG (1000 UNIT) tablet Take 2,000 Units by mouth daily.     dofetilide  (TIKOSYN ) 500 MCG capsule Take 1 capsule (500 mcg total) by mouth 2 (two) times daily. 180 capsule 1   doxycycline  (VIBRA -TABS) 100 MG tablet Take 1 tablet (100 mg total) by mouth 2 (two) times daily. 180 tablet 4   finasteride  (PROSCAR ) 5 MG tablet Take 1 tablet (5 mg total) by mouth daily at 12 noon. 90 tablet 3   fluorouracil  (EFUDEX ) 5 % cream Apply topically 2 (two) times a day for 14 days. To full face, scalp, ears. 40 g 0   furosemide  (LASIX ) 20 MG tablet Take 1 tablet (20 mg total) by mouth daily as needed (FOR SWELLING OR WEIGHT GAIN OF 3  POUNDS OVERNIGHT OR 5 POUNDS IN ONE WEEK.). 90 tablet 3   saccharomyces boulardii (FLORASTOR) 250 MG capsule as directed Orally     valACYclovir  (VALTREX ) 500 MG tablet Take 500 mg by mouth 2 (two) times daily as needed (Cold sore). for 5 days at onset of cold sore  2   vitamin B-12 (CYANOCOBALAMIN ) 1000 MCG tablet Take 1,000 mcg by mouth daily.     No current facility-administered medications for this encounter.    ROS- All systems are reviewed and negative except as per the  HPI above.  Physical Exam: Vitals:   05/05/24 0941  BP: 124/70  Pulse: 60  Weight: 86.4 kg  Height: 6' (1.829 m)    GEN: Well nourished, well developed in no acute distress CARDIAC: Regular rate and rhythm with occasional ectopy, no rubs, gallops, 2/6 systolic murmur  RESPIRATORY:  Clear to auscultation without rales, wheezing or rhonchi  ABDOMEN: Soft, non-tender, non-distended EXTREMITIES:  No edema; No deformity    Wt Readings from Last 3 Encounters:  05/05/24 86.4 kg  02/22/24 82.2 kg  12/09/23 85 kg    EKG today demonstrates  SR, PACs Vent. rate 60 BPM PR interval 124 ms QRS duration 98 ms QT/QTcB 458/458 ms   Echo 02/23/23 demonstrated   1. Left ventricular ejection fraction, by estimation, is 60 to 65%. The  left ventricle has normal function. The left ventricle has no regional  wall motion abnormalities. There is mild concentric left ventricular  hypertrophy. Left ventricular diastolic parameters are consistent with Grade II diastolic dysfunction (pseudonormalization). Elevated left ventricular end-diastolic pressure.   2. Right ventricular systolic function is normal. The right ventricular  size is normal. There is mildly elevated pulmonary artery systolic  pressure.   3. Left atrial size was mildly dilated.   4. The mitral valve is normal in structure. Mild to moderate mitral valve  regurgitation. No evidence of mitral stenosis.   5. Mild perivalvular regurgitation. The aortic valve has been  repaired/replaced. Aortic valve regurgitation is mild. No aortic stenosis  is present. There is a 26 mm Sapien prosthetic (TAVR) valve present in the  aortic position. Procedure Date:  02/25/2022. Aortic regurgitation PHT measures 397 msec. Aortic valve area,  by VTI measures 1.11 cm. Aortic valve mean gradient measures 13.0 mmHg.  Aortic valve Vmax measures 2.32 m/s.   6. The inferior vena cava is normal in size with greater than 50%  respiratory variability,  suggesting right atrial pressure of 3 mmHg.   Epic records are reviewed at length today   CHA2DS2-VASc Score = 3  The patient's score is based upon: CHF History: 0 HTN History: 0 Diabetes History: 0 Stroke History: 0 Vascular Disease History: 1 Age Score: 2 Gender Score: 0       ASSESSMENT AND PLAN: Paroxysmal Atrial Fibrillation/atrial tach (ICD10:  I48.0) The patient's CHA2DS2-VASc score is 3, indicating a 3.2% annual risk of stroke.   S/p dofetilide  admission 07/2022 Patient appears to be maintaining SR Continue dofetilide  500 mcg BID Continue Eliquis  5 mg BID Off AV nodal agents with h/o symptomatic bradycardia.  Secondary Hypercoagulable State (ICD10:  D68.69) The patient is at significant risk for stroke/thromboembolism based upon his CHA2DS2-VASc Score of 3.  Continue Apixaban  (Eliquis ). No bleeding issues.   High Risk Medication Monitoring (ICD 10: [id number]) Patient requires ongoing monitoring for anti-arrhythmic medication which has the potential to cause life threatening arrhythmias. QT interval on ECG acceptable for dofetilide  monitoring. Check bmet/mag today.     VHD Severe  AS s/p TAVR Mild to moderate MR Followed by Dr Barbaraann  CAD No anginal symptoms Followed by Dr Barbaraann   Follow up in the AF clinic in 6 months.    Daril Kicks PA-C [facility] [address] [place], [place] [address] [phone] 05/05/2024 9:53 AM

## 2024-05-06 ENCOUNTER — Ambulatory Visit (HOSPITAL_COMMUNITY): Payer: Self-pay | Admitting: Physician Assistant

## 2024-05-06 ENCOUNTER — Other Ambulatory Visit (HOSPITAL_COMMUNITY): Payer: Self-pay

## 2024-05-06 LAB — BASIC METABOLIC PANEL WITH GFR
BUN/Creatinine Ratio: 17 (ref 10–24)
BUN: 17 mg/dL (ref 8–27)
CO2: 21 mmol/L (ref 20–29)
Calcium: 9.4 mg/dL (ref 8.6–10.2)
Chloride: 105 mmol/L (ref 96–106)
Creatinine, Ser: 1.03 mg/dL (ref 0.76–1.27)
Glucose: 92 mg/dL (ref 70–99)
Potassium: 5.2 mmol/L (ref 3.5–5.2)
Sodium: 141 mmol/L (ref 134–144)
eGFR: 72 mL/min/1.73 (ref >59)

## 2024-05-06 LAB — MAGNESIUM: Magnesium: 2.2 mg/dL (ref 1.6–2.3)

## 2024-05-09 ENCOUNTER — Other Ambulatory Visit (HOSPITAL_COMMUNITY): Payer: Self-pay

## 2024-05-10 ENCOUNTER — Encounter (HOSPITAL_COMMUNITY): Payer: Self-pay

## 2024-05-10 ENCOUNTER — Other Ambulatory Visit (HOSPITAL_COMMUNITY): Payer: Self-pay

## 2024-05-11 DIAGNOSIS — I480 Paroxysmal atrial fibrillation: Secondary | ICD-10-CM | POA: Diagnosis not present

## 2024-05-11 DIAGNOSIS — E7849 Other hyperlipidemia: Secondary | ICD-10-CM | POA: Diagnosis not present

## 2024-05-11 DIAGNOSIS — M170 Bilateral primary osteoarthritis of knee: Secondary | ICD-10-CM | POA: Diagnosis not present

## 2024-05-13 ENCOUNTER — Other Ambulatory Visit: Payer: Self-pay

## 2024-05-13 ENCOUNTER — Encounter: Payer: Self-pay | Admitting: Urology

## 2024-05-13 ENCOUNTER — Other Ambulatory Visit (HOSPITAL_COMMUNITY): Payer: Self-pay

## 2024-05-13 ENCOUNTER — Ambulatory Visit: Payer: Medicare Other | Admitting: Urology

## 2024-05-13 VITALS — BP 115/68 | HR 74

## 2024-05-13 DIAGNOSIS — N138 Other obstructive and reflux uropathy: Secondary | ICD-10-CM

## 2024-05-13 DIAGNOSIS — N401 Enlarged prostate with lower urinary tract symptoms: Secondary | ICD-10-CM | POA: Diagnosis not present

## 2024-05-13 DIAGNOSIS — N529 Male erectile dysfunction, unspecified: Secondary | ICD-10-CM

## 2024-05-13 DIAGNOSIS — R350 Frequency of micturition: Secondary | ICD-10-CM

## 2024-05-13 LAB — URINALYSIS, ROUTINE W REFLEX MICROSCOPIC
Bilirubin, UA: NEGATIVE
Glucose, UA: NEGATIVE
Ketones, UA: NEGATIVE
Leukocytes,UA: NEGATIVE
Nitrite, UA: NEGATIVE
Protein,UA: NEGATIVE
RBC, UA: NEGATIVE
Specific Gravity, UA: 1.025 (ref 1.005–1.030)
Urobilinogen, Ur: 0.2 mg/dL (ref 0.2–1.0)
pH, UA: 6 (ref 5.0–7.5)

## 2024-05-13 MED ORDER — ALFUZOSIN HCL ER 10 MG PO TB24
10.0000 mg | ORAL_TABLET | Freq: Every day | ORAL | 3 refills | Status: AC
  Filled 2024-05-13 – 2024-05-18 (×2): qty 90, 90d supply, fill #0
  Filled 2024-08-25: qty 90, 90d supply, fill #1
  Filled 2024-09-13: qty 90, 90d supply, fill #2

## 2024-05-13 MED ORDER — FINASTERIDE 5 MG PO TABS
5.0000 mg | ORAL_TABLET | Freq: Every day | ORAL | 3 refills | Status: AC
  Filled 2024-05-13 – 2024-07-18 (×2): qty 90, 90d supply, fill #0
  Filled 2024-09-13: qty 90, 90d supply, fill #1

## 2024-05-13 NOTE — Patient Instructions (Signed)

## 2024-05-13 NOTE — Progress Notes (Signed)
 05/13/2024 8:46 AM   Gerald Spears 1941-05-26 [id number]  Referring provider: Arloa Elsie SAUNDERS, MD [phone] [facility] [address] [place] [facility],  [place] [address]  Followup BPh   HPI: Gerald Spears is a 83yo here for followup for BPH wirth urinary frequency/nocturia. IPSS 3 QOL 0 on uroxatral  10mg  at bedtime and finasteride . Nocturia 1x. No recent PSA. Urine stream strong. No straining to urinate.    PMH: Past Medical History:  Diagnosis Date   Cancer [facility])    colon cancer and melanoma   Complication of anesthesia    difficult to wake up after surgery   Coronary artery disease    DJD (degenerative joint disease)    Dr. Jane end stages of DJD 2011   Hard of hearing    History of hiatal hernia    PAF (paroxysmal atrial fibrillation) (HCC)    S/P TAVR (transcatheter aortic valve replacement) 02/25/2022   S3UR 26mm via TF apprach with Dr. Wendel and Dr. Lucas   Severe aortic stenosis    Urinary frequency    UTI (lower urinary tract infection)     Surgical History: Past Surgical History:  Procedure Laterality Date   ANKLE SURGERY Right    COLONOSCOPY W/ BIOPSIES AND POLYPECTOMY     ELBOW SURGERY Right    EYE SURGERY     HERNIA REPAIR     INTRAOPERATIVE TRANSTHORACIC ECHOCARDIOGRAM N/A 02/25/2022   Procedure: INTRAOPERATIVE TRANSTHORACIC ECHOCARDIOGRAM;  Surgeon: Wendel Lurena POUR, MD;  Location: [facility];  Service: Open Heart Surgery;  Laterality: N/A;   JOINT REPLACEMENT     KNEE SURGERY Right    x4   LEFT HEART CATH AND CORONARY ANGIOGRAPHY N/A 09/12/2020   Procedure: LEFT HEART CATH AND CORONARY ANGIOGRAPHY;  Surgeon: Wonda Sharper, MD;  Location: [facility];  Service: Cardiovascular;  Laterality: N/A;   MULTIPLE TOOTH EXTRACTIONS     RIGHT/LEFT HEART CATH AND CORONARY ANGIOGRAPHY N/A 01/23/2022   Procedure: RIGHT/LEFT HEART CATH AND CORONARY ANGIOGRAPHY;  Surgeon: Wonda Sharper, MD;  Location: [facility];  Service: Cardiovascular;   Laterality: N/A;   TOTAL KNEE ARTHROPLASTY Right    TOTAL KNEE ARTHROPLASTY Left 06/18/2015   Procedure: TOTAL KNEE ARTHROPLASTY;  Surgeon: Dempsey Sensor, MD;  Location: [facility];  Service: Orthopedics;  Laterality: Left;   TOTAL KNEE REVISION Right 02/11/2021   Procedure: RIGHT TOTAL KNEE REVISION;  Surgeon: Sensor Dempsey, MD;  Location: [facility];  Service: Orthopedics;  Laterality: Right;   TRANSCATHETER AORTIC VALVE REPLACEMENT, TRANSFEMORAL N/A 02/25/2022   Procedure: Transcatheter Aortic Valve Replacement, Transfemoral;  Surgeon: Thukkani, Arun K, MD;  Location: [facility];  Service: Open Heart Surgery;  Laterality: N/A;    Home Medications:  Allergies as of 05/13/2024   No Known Allergies      Medication List        Accurate as of May 13, 2024  8:46 AM. If you have any questions, ask your nurse Spears doctor.          acetaminophen  650 MG CR tablet Commonly known as: TYLENOL  Take 650 mg by mouth every 8 (eight) hours as needed for pain.   alfuzosin  10 MG 24 hr tablet Commonly known as: UROXATRAL  Take 1 tablet (10 mg total) by mouth at bedtime.   amoxicillin 500 MG capsule Commonly known as: AMOXIL Take 2,000 mg by mouth See admin instructions. Take 2000 mg 1 hour prior to dental work   atorvastatin  10 MG tablet Commonly known as:  LIPITOR TAKE 1 TABLET ON MONDAY,WEDNESDAYS AND FRIDAYS   cholecalciferol 25 MCG (1000 UNIT) tablet Commonly known as: VITAMIN D3 Take 2,000 Units by mouth daily.   cyanocobalamin  1000 MCG tablet Commonly known as: VITAMIN B12 Take 1,000 mcg by mouth daily.   dofetilide  500 MCG capsule Commonly known as: TIKOSYN  Take 1 capsule (500 mcg total) by mouth 2 (two) times daily.   doxycycline  100 MG tablet Commonly known as: VIBRA -TABS Take 1 tablet (100 mg total) by mouth 2 (two) times daily.   Eliquis  5 MG Tabs tablet Generic drug: apixaban  Take 1 tablet (5 mg total) by mouth 2 (two) times daily.   finasteride  5 MG  tablet Commonly known as: PROSCAR  Take 1 tablet (5 mg total) by mouth daily at 12 noon.   fluorouracil  5 % cream Commonly known as: EFUDEX  Apply topically 2 (two) times a day for 14 days. To full face, scalp, ears.   furosemide  20 MG tablet Commonly known as: LASIX  Take 1 tablet (20 mg total) by mouth daily as needed (FOR SWELLING Spears WEIGHT GAIN OF 3 POUNDS OVERNIGHT Spears 5 POUNDS IN ONE WEEK.).   saccharomyces boulardii 250 MG capsule Commonly known as: FLORASTOR as directed Orally   valACYclovir  500 MG tablet Commonly known as: VALTREX  Take 500 mg by mouth 2 (two) times daily as needed (Cold sore). for 5 days at onset of cold sore        Allergies: No Known Allergies  Family History: Family History  Problem Relation Age of Onset   Heart disease Father    Heart attack Mother        age 79   Breast cancer Sister    Breast cancer Sister     Social History:  reports that he has never smoked. He has never used smokeless tobacco. He reports that he does not drink alcohol and does not use drugs.  ROS: All other review of systems were reviewed and are negative except what is noted above in HPI  Physical Exam: BP 115/68   Pulse 74   Constitutional:  Alert and oriented, No acute distress. HEENT: [place] AT, moist mucus membranes.  Trachea midline, no masses. Cardiovascular: No clubbing, cyanosis, Spears edema. Respiratory: Normal respiratory effort, no increased work of breathing. GI: Abdomen is soft, nontender, nondistended, no abdominal masses GU: No CVA tenderness.  Lymph: No cervical Spears inguinal lymphadenopathy. Skin: No rashes, bruises Spears suspicious lesions. Neurologic: Grossly intact, no focal deficits, moving all 4 extremities. Psychiatric: Normal mood and affect.  Laboratory Data: [facility] Results  Component Value Date   WBC 5.2 10/29/2023   HGB 13.0 10/29/2023   HCT 39.6 10/29/2023   MCV 91.7 10/29/2023   PLT 110 (L) 10/29/2023    [facility] Results  Component Value Date    CREATININE 1.03 05/05/2024    No results found for: PSA  No results found for: TESTOSTERONE   No results found for: HGBA1C  Urinalysis    Component Value Date/Time   COLORURINE YELLOW 02/21/2022 0922   APPEARANCEUR Clear 05/13/2023 1057   LABSPEC 1.016 02/21/2022 0922   PHURINE 6.0 02/21/2022 0922   GLUCOSEU Negative 05/13/2023 1057   HGBUR NEGATIVE 02/21/2022 0922   BILIRUBINUR Negative 05/13/2023 1057   KETONESUR NEGATIVE 02/21/2022 0922   PROTEINUR Negative 05/13/2023 1057   PROTEINUR NEGATIVE 02/21/2022 0922   UROBILINOGEN 0.2 06/07/2015 0912   NITRITE Negative 05/13/2023 1057   NITRITE NEGATIVE 02/21/2022 0922   LEUKOCYTESUR Negative 05/13/2023 1057   LEUKOCYTESUR NEGATIVE 02/21/2022 9077  [facility] Results  Component Value Date   LABMICR Comment 05/13/2023   WBCUA 0-5 03/31/2022   LABEPIT 0-10 03/31/2022   MUCUS Present (A) 03/31/2022   BACTERIA None seen 03/31/2022    Pertinent Imaging:  No results found for this Spears any previous visit.  No results found for this [facility] any previous visit.  No results found for this [facility] any previous visit.  No results found for this [facility] any previous visit.  No results found for this [facility] any previous visit.  No results found for this [facility] any previous visit.  No results found for this [facility] any previous visit.  No results found for this Spears any previous visit.   Assessment & Plan:    1. Benign prostatic hyperplasia with urinary obstruction (Primary) -continue uroxatral  10mg  at bedtime and finasteride  - Urinalysis, Routine w reflex microscopic  2. Urinary frequency Continue uroxatral  10mg  at bedtime and finasteride      No follow-ups on file.  Belvie Clara, MD  [facility] [place]

## 2024-05-14 LAB — PSA: Prostate Specific Ag, Serum: 0.2 ng/mL (ref 0.0–4.0)

## 2024-05-18 ENCOUNTER — Other Ambulatory Visit (HOSPITAL_COMMUNITY): Payer: Self-pay

## 2024-05-18 ENCOUNTER — Other Ambulatory Visit: Payer: Self-pay

## 2024-05-24 ENCOUNTER — Ambulatory Visit: Payer: Self-pay | Admitting: Urology

## 2024-06-05 ENCOUNTER — Other Ambulatory Visit (HOSPITAL_COMMUNITY): Payer: Self-pay | Admitting: Physician Assistant

## 2024-06-05 ENCOUNTER — Other Ambulatory Visit (HOSPITAL_COMMUNITY): Payer: Self-pay

## 2024-06-05 ENCOUNTER — Other Ambulatory Visit: Payer: Self-pay | Admitting: Cardiovascular Disease

## 2024-06-06 ENCOUNTER — Other Ambulatory Visit: Payer: Self-pay

## 2024-06-06 ENCOUNTER — Other Ambulatory Visit (HOSPITAL_COMMUNITY): Payer: Self-pay

## 2024-06-06 MED ORDER — DOXYCYCLINE HYCLATE 100 MG PO TABS
100.0000 mg | ORAL_TABLET | Freq: Two times a day (BID) | ORAL | 4 refills | Status: AC
  Filled 2024-06-06: qty 180, 90d supply, fill #0
  Filled 2024-09-13: qty 180, 90d supply, fill #1

## 2024-06-06 MED ORDER — ATORVASTATIN CALCIUM 10 MG PO TABS
ORAL_TABLET | ORAL | 2 refills | Status: AC
  Filled 2024-06-06: qty 36, 84d supply, fill #0
  Filled 2024-09-13: qty 36, 84d supply, fill #1

## 2024-06-06 MED ORDER — DOFETILIDE 500 MCG PO CAPS
500.0000 ug | ORAL_CAPSULE | Freq: Two times a day (BID) | ORAL | 1 refills | Status: AC
  Filled 2024-06-06: qty 180, 90d supply, fill #0
  Filled 2024-09-13: qty 180, 90d supply, fill #1

## 2024-06-07 ENCOUNTER — Other Ambulatory Visit: Payer: Self-pay

## 2024-06-14 DIAGNOSIS — Z471 Aftercare following joint replacement surgery: Secondary | ICD-10-CM | POA: Diagnosis not present

## 2024-06-14 DIAGNOSIS — Z96653 Presence of artificial knee joint, bilateral: Secondary | ICD-10-CM | POA: Diagnosis not present

## 2024-07-12 DIAGNOSIS — M170 Bilateral primary osteoarthritis of knee: Secondary | ICD-10-CM | POA: Diagnosis not present

## 2024-07-12 DIAGNOSIS — E7849 Other hyperlipidemia: Secondary | ICD-10-CM | POA: Diagnosis not present

## 2024-07-12 DIAGNOSIS — I480 Paroxysmal atrial fibrillation: Secondary | ICD-10-CM | POA: Diagnosis not present

## 2024-07-18 ENCOUNTER — Other Ambulatory Visit: Payer: Self-pay

## 2024-07-18 ENCOUNTER — Other Ambulatory Visit (HOSPITAL_COMMUNITY): Payer: Self-pay

## 2024-08-25 ENCOUNTER — Telehealth: Payer: Self-pay | Admitting: Neurology

## 2024-08-25 ENCOUNTER — Other Ambulatory Visit: Payer: Self-pay
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
## 2024-09-05 NOTE — Telephone Encounter (Signed)
LVM for patient to call the office to schedule appointment.

## 2024-09-14 ENCOUNTER — Other Ambulatory Visit: Payer: Self-pay

## 2024-09-14 ENCOUNTER — Other Ambulatory Visit (HOSPITAL_COMMUNITY): Payer: Self-pay

## 2024-09-20 ENCOUNTER — Other Ambulatory Visit (HOSPITAL_COMMUNITY): Payer: Self-pay

## 2024-09-20 ENCOUNTER — Other Ambulatory Visit: Payer: Self-pay

## 2024-10-24 ENCOUNTER — Ambulatory Visit: Admitting: Neurology

## 2024-11-03 ENCOUNTER — Ambulatory Visit (HOSPITAL_COMMUNITY): Admitting: Physician Assistant

## 2025-05-19 ENCOUNTER — Ambulatory Visit: Admitting: Urology
